# Patient Record
Sex: Female | Born: 1980
Health system: Southern US, Community
[De-identification: ages and names within clinical notes are randomized; demographics above are authoritative.]

## PROBLEM LIST (undated history)

## (undated) DIAGNOSIS — K529 Noninfective gastroenteritis and colitis, unspecified: Secondary | ICD-10-CM

## (undated) DIAGNOSIS — I10 Essential (primary) hypertension: Secondary | ICD-10-CM

## (undated) DIAGNOSIS — T7840XA Allergy, unspecified, initial encounter: Secondary | ICD-10-CM

## (undated) DIAGNOSIS — K219 Gastro-esophageal reflux disease without esophagitis: Secondary | ICD-10-CM

## (undated) DIAGNOSIS — R519 Headache, unspecified: Secondary | ICD-10-CM

## (undated) DIAGNOSIS — K649 Unspecified hemorrhoids: Secondary | ICD-10-CM

## (undated) DIAGNOSIS — F329 Major depressive disorder, single episode, unspecified: Secondary | ICD-10-CM

## (undated) DIAGNOSIS — F32A Depression, unspecified: Secondary | ICD-10-CM

## (undated) DIAGNOSIS — F419 Anxiety disorder, unspecified: Secondary | ICD-10-CM

## (undated) HISTORY — DX: Unspecified hemorrhoids: K64.9

## (undated) HISTORY — DX: Allergy, unspecified, initial encounter: T78.40XA

## (undated) HISTORY — DX: Anxiety disorder, unspecified: F41.9

## (undated) HISTORY — PX: COLONOSCOPY: SHX174

## (undated) HISTORY — DX: Noninfective gastroenteritis and colitis, unspecified: K52.9

## (undated) HISTORY — DX: Headache, unspecified: R51.9

## (undated) HISTORY — DX: Gastro-esophageal reflux disease without esophagitis: K21.9

## (undated) HISTORY — PX: OTHER SURGICAL HISTORY: SHX169

## (undated) HISTORY — PX: WISDOM TOOTH EXTRACTION: SHX21

---
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
## 2001-07-02 ENCOUNTER — Other Ambulatory Visit: Admission: RE | Admit: 2001-07-02 | Discharge: 2001-07-02 | Payer: Self-pay | Admitting: Obstetrics and Gynecology

## 2002-07-27 ENCOUNTER — Other Ambulatory Visit: Admission: RE | Admit: 2002-07-27 | Discharge: 2002-07-27 | Payer: Self-pay | Admitting: Obstetrics and Gynecology

## 2003-06-30 ENCOUNTER — Ambulatory Visit (HOSPITAL_COMMUNITY): Admission: RE | Admit: 2003-06-30 | Discharge: 2003-06-30 | Payer: Self-pay | Admitting: Family Medicine

## 2004-02-19 ENCOUNTER — Other Ambulatory Visit: Admission: RE | Admit: 2004-02-19 | Discharge: 2004-02-19 | Payer: Self-pay | Admitting: Obstetrics and Gynecology

## 2004-10-15 ENCOUNTER — Other Ambulatory Visit: Admission: RE | Admit: 2004-10-15 | Discharge: 2004-10-15 | Payer: Self-pay | Admitting: Obstetrics and Gynecology

## 2005-04-11 ENCOUNTER — Other Ambulatory Visit: Admission: RE | Admit: 2005-04-11 | Discharge: 2005-04-11 | Payer: Self-pay | Admitting: Obstetrics and Gynecology

## 2005-07-07 ENCOUNTER — Observation Stay (HOSPITAL_COMMUNITY): Admission: AD | Admit: 2005-07-07 | Discharge: 2005-07-08 | Payer: Self-pay | Admitting: Obstetrics and Gynecology

## 2005-10-07 ENCOUNTER — Encounter: Admission: RE | Admit: 2005-10-07 | Discharge: 2005-10-07 | Payer: Self-pay | Admitting: Obstetrics and Gynecology

## 2005-11-27 ENCOUNTER — Inpatient Hospital Stay (HOSPITAL_COMMUNITY): Admission: AD | Admit: 2005-11-27 | Discharge: 2005-11-27 | Payer: Self-pay | Admitting: Obstetrics and Gynecology

## 2005-11-28 ENCOUNTER — Inpatient Hospital Stay (HOSPITAL_COMMUNITY): Admission: AD | Admit: 2005-11-28 | Discharge: 2005-11-28 | Payer: Self-pay | Admitting: Obstetrics and Gynecology

## 2005-11-29 ENCOUNTER — Inpatient Hospital Stay (HOSPITAL_COMMUNITY): Admission: AD | Admit: 2005-11-29 | Discharge: 2005-11-29 | Payer: Self-pay | Admitting: Obstetrics and Gynecology

## 2005-12-01 ENCOUNTER — Inpatient Hospital Stay (HOSPITAL_COMMUNITY): Admission: AD | Admit: 2005-12-01 | Discharge: 2005-12-01 | Payer: Self-pay | Admitting: Obstetrics and Gynecology

## 2005-12-04 ENCOUNTER — Inpatient Hospital Stay (HOSPITAL_COMMUNITY): Admission: AD | Admit: 2005-12-04 | Discharge: 2005-12-06 | Payer: Self-pay | Admitting: Obstetrics & Gynecology

## 2008-04-24 ENCOUNTER — Ambulatory Visit (HOSPITAL_COMMUNITY): Admission: RE | Admit: 2008-04-24 | Discharge: 2008-04-24 | Payer: Self-pay | Admitting: Obstetrics and Gynecology

## 2008-12-31 ENCOUNTER — Inpatient Hospital Stay (HOSPITAL_COMMUNITY): Admission: AD | Admit: 2008-12-31 | Discharge: 2008-12-31 | Payer: Self-pay | Admitting: Obstetrics and Gynecology

## 2009-01-14 ENCOUNTER — Inpatient Hospital Stay (HOSPITAL_COMMUNITY): Admission: AD | Admit: 2009-01-14 | Discharge: 2009-01-14 | Payer: Self-pay | Admitting: Obstetrics and Gynecology

## 2009-01-25 ENCOUNTER — Inpatient Hospital Stay (HOSPITAL_COMMUNITY): Admission: RE | Admit: 2009-01-25 | Discharge: 2009-01-26 | Payer: Self-pay | Admitting: Obstetrics and Gynecology

## 2009-04-10 HISTORY — PX: ESSURE TUBAL LIGATION: SUR464

## 2009-06-26 ENCOUNTER — Ambulatory Visit (HOSPITAL_COMMUNITY): Admission: RE | Admit: 2009-06-26 | Discharge: 2009-06-26 | Payer: Self-pay | Admitting: Internal Medicine

## 2009-09-25 ENCOUNTER — Emergency Department (HOSPITAL_COMMUNITY): Admission: EM | Admit: 2009-09-25 | Discharge: 2009-09-25 | Payer: Self-pay | Admitting: Emergency Medicine

## 2010-05-25 LAB — POCT RAPID STREP A (OFFICE): Streptococcus, Group A Screen (Direct): NEGATIVE

## 2010-06-12 LAB — CBC
Hemoglobin: 9 g/dL — ABNORMAL LOW (ref 12.0–15.0)
MCV: 93.6 fL (ref 78.0–100.0)
Platelets: 158 10*3/uL (ref 150–400)
RDW: 14 % (ref 11.5–15.5)
WBC: 13.2 10*3/uL — ABNORMAL HIGH (ref 4.0–10.5)

## 2010-06-12 LAB — RPR: RPR Ser Ql: NONREACTIVE

## 2010-11-10 ENCOUNTER — Inpatient Hospital Stay (HOSPITAL_COMMUNITY)
Admission: RE | Admit: 2010-11-10 | Discharge: 2010-11-13 | DRG: 885 | Disposition: A | Discharge status: Home / Self Care | Payer: 59 | Source: Ambulatory Visit | Attending: Psychiatry | Admitting: Psychiatry

## 2010-11-10 DIAGNOSIS — T43205A Adverse effect of unspecified antidepressants, initial encounter: Secondary | ICD-10-CM

## 2010-11-10 DIAGNOSIS — R45851 Suicidal ideations: Secondary | ICD-10-CM

## 2010-11-10 DIAGNOSIS — F339 Major depressive disorder, recurrent, unspecified: Principal | ICD-10-CM

## 2010-11-10 DIAGNOSIS — R51 Headache: Secondary | ICD-10-CM

## 2010-11-10 DIAGNOSIS — R110 Nausea: Secondary | ICD-10-CM

## 2010-11-10 DIAGNOSIS — Z818 Family history of other mental and behavioral disorders: Secondary | ICD-10-CM

## 2010-11-10 DIAGNOSIS — Z79899 Other long term (current) drug therapy: Secondary | ICD-10-CM

## 2010-11-10 LAB — CBC
Hemoglobin: 14.1 g/dL (ref 12.0–15.0)
MCH: 30.2 pg (ref 26.0–34.0)
RBC: 4.67 MIL/uL (ref 3.87–5.11)
WBC: 11.1 10*3/uL — ABNORMAL HIGH (ref 4.0–10.5)

## 2010-11-10 LAB — COMPREHENSIVE METABOLIC PANEL
ALT: 11 U/L (ref 0–35)
Alkaline Phosphatase: 65 U/L (ref 39–117)
CO2: 26 mEq/L (ref 19–32)
Calcium: 9.6 mg/dL (ref 8.4–10.5)
Chloride: 103 mEq/L (ref 96–112)
GFR calc Af Amer: >60 mL/min (ref >60)
GFR calc non Af Amer: >60 mL/min (ref >60)
Glucose, Bld: 112 mg/dL — ABNORMAL HIGH (ref 70–99)
Potassium: 3.8 mEq/L (ref 3.5–5.1)
Sodium: 139 mEq/L (ref 135–145)
Total Bilirubin: 0.4 mg/dL (ref 0.3–1.2)

## 2010-11-11 DIAGNOSIS — F329 Major depressive disorder, single episode, unspecified: Secondary | ICD-10-CM

## 2010-11-11 LAB — URINALYSIS, ROUTINE W REFLEX MICROSCOPIC
Bilirubin Urine: NEGATIVE
Hgb urine dipstick: NEGATIVE
Specific Gravity, Urine: 1.01 (ref 1.005–1.030)
pH: 6 (ref 5.0–8.0)

## 2010-11-11 LAB — URINE MICROSCOPIC-ADD ON

## 2010-11-12 LAB — DRUGS OF ABUSE SCREEN W/O ALC, ROUTINE URINE
Amphetamine Screen, Ur: NEGATIVE
Barbiturate Quant, Ur: NEGATIVE
Cocaine Metabolites: NEGATIVE
Creatinine,U: 65 mg/dL
Propoxyphene: NEGATIVE

## 2010-11-12 LAB — URINALYSIS, ROUTINE W REFLEX MICROSCOPIC
Protein, ur: NEGATIVE mg/dL
Specific Gravity, Urine: 1.023 (ref 1.005–1.030)
pH: 5.5 (ref 5.0–8.0)

## 2010-11-12 LAB — URINE MICROSCOPIC-ADD ON

## 2010-11-14 NOTE — Discharge Summary (Signed)
  NAMEALIEYAH, Paige Hansen NO.:  [id number]  MEDICAL RECORD NO.:  [id number]  LOCATION:  0502                          FACILITY:  [facility]  PHYSICIAN:  Carloyn Jaeger, MD     DATE OF BIRTH:  [birth year]  DATE OF ADMISSION:  11/10/2010 DATE OF DISCHARGE:  11/13/2010                              DISCHARGE SUMMARY   REASON FOR ADMISSION:  This is a 30 year old female that presented with worsening depression for the past 6 months.  She was feeling irritable, isolating and having fleeting suicidal thoughts.  FINAL IMPRESSION:  Axis I:  Major depression disorder recurrent. Axis II:  Deferred. Axis III:  No acute illnesses. Axis IV:  Work-related stressors. Axis V:  GAF at discharge is 70.  SIGNIFICANT FINDINGS:  The patient was admitted to the adult milieu for safety and stabilization.  We did routine screening labs.  The patient was started on a total of 75 mg of Zoloft.  We had Vistaril available for anxiety.  She was reporting good sleep and appetite, mild depressive symptoms rating it a 3 on a scale of 1-10.  Denied any auditory hallucinations or delusional thinking.  She was experiencing some nausea and headache since she started on the Zoloft.  That was therefore discontinued, and she was started on Celexa.  She was feeling better. Sleep was much improved, and she adamantly denied any suicidal thoughts. We had contact with her husband to address any safety issues and for [place] to provide information.  The patient also was started on Cipro on her last day as the patient had white blood cells in her urine.  The patient was informed of her labs and was agreeable to starting the medications. On the day of discharge, sleep was good.  Appetite was good.  She had mild depressive symptoms, rating it a 1 on a scale of 1-10.  She adamantly denied any suicidal or homicidal thoughts or psychotic symptoms.  She was seen by the interdisciplinary treatment team.  DISCHARGE  MEDICATIONS: 1. Cipro 250 mg, 1 b.i.d. 2. Celexa 20 mg 1 daily. 3. Hydroxyzine 25 mg 1 twice a day as needed for anxiety.  FOLLOWUP:  Her follow-up appointment was with [facility], phone number [phone] on September 6th at 1:00 p.m.     Redgie Grayer, N.P.   ______________________________ Carloyn Jaeger, MD    JO/MEDQ  D:  11/13/2010  T:  11/13/2010  Job:  [id number]  Electronically Signed by Arnetha CourserP. on 11/14/2010 09:12:09 AM Electronically Signed by Carloyn Jaeger MD on 11/14/2010 01:39:31 PM

## 2010-11-24 NOTE — Assessment & Plan Note (Signed)
[name], Hansen              ACCOUNT NO.:  [id number]  MEDICAL RECORD NO.:  [id number]  LOCATION:  7939                          FACILITY:  [facility]  PHYSICIAN:  Carloyn Jaeger, MD     DATE OF BIRTH:  May 08, 1980  DATE OF ADMISSION:  11/10/2010 DATE OF DISCHARGE:                      PSYCHIATRIC ADMISSION ASSESSMENT   HISTORY OF PRESENT ILLNESS:  This is a 30 year old married white female. 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  She works as Designer, multimedia in the ICU.  She was accompanied by her supervisor [name], who remained during the assessment with verbal consent of the patient.  She reported worsening depression for the past 6 months.  She stated that about 6 months ago she started feeling snappy, and then she started to withdraw.  Reports having had fleeting suicidal ideation and then developing a plan to drive into oncoming traffic for the past 3 days.  Her supervisor reports that recently coworkers have reported concerns and complaints about the patient's changing demeanor.  She was called into the director's office 3 days ago, and the patient states that that is when the suicidal ideation became a plan.  She had thoughts to kill herself and then she "would not have to worry about what other people thought."  She states that after the birth of her daughter in November 2010, she did have some postpartum depression.  She was prescribed Zoloft, although she never filled the script.  Her work is depressing her.  She has been feeling upset recently when sitting with failed suicide attempted patients.  Her husband, a Airline pilot, recently had a work schedule changed to 24 hours on 48 hours off, and one of his fellow fire fighters recently suicided last week.  PAST PSYCHIATRIC HISTORY:  As already stated, she reports a slight postpartum depression after the birth of her daughter in November 2010. She was prescribed Zoloft but never took it.  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  She has a son 84, a daughter almost 2.  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  FAMILY HISTORY:  Her mother has depression and takes Zoloft.  ALCOHOL AND DRUG HISTORY:  Negative.  PRIMARY CARE PROVIDER:  Dr. Azalia Bilis.  MEDICAL PROBLEMS:  None known.  She states that in the past 6 months she has been gaining weight.  She wants to sleep all the time, occasionally she has had heart palpitations, and just in general does not feel pretty anymore.  There is a strong family history for hypothyroidism.  Her mother and all aunts are medicated for such, and she has not been evaluated.  MEDICATIONS:  None  DRUG ALLERGIES:  No known drug allergies  POSITIVE PHYSICAL FINDINGS:  Well-developed, well-nourished female who appears her stated age of 24.  Her vital signs were stable.  Her labs are being drawn.  MENTAL STATUS EXAM:  She is alert and oriented.  She was appropriately groomed and dressed in her own clothing.  Her speech was not  pressured. Her mood is depressed.  She was frequently and easily tearful.  Thought processes are clear, rational and goal oriented.  She knows that she needs to get help.  Judgment and insight are fair.  Concentration and memory are intact.  She still has suicidal ideation.  She did have a plan to drive on the wrong side of the road, and she realized that this would allow the insurance to pay out.  She is negative for homicidal ideation.  She is negative for auditory or visual hallucinations.  ADMISSION DIAGNOSES:  AXIS I:  Major depressive disorder, single episode, severe without psychotic features. AXIS II:  Deferred AXIS III:  Rule out hypothyroidism AXIS IV:  Work related stress issues AXIS V:  75  PLAN:  Plan is to admit for safety and stabilization.  We will  do routine screening to include a full thyroid functions.  We will start her on some Zoloft give her 25 mg dose now.  Will give her 50 mg tonight, so she will get a total of 75 mg today.  Will also allow Vistaril 25 mg p.o. q.4-6 h p.r.n.  Estimated length of stay is 3-5 days.     Paige Hansen, P.A.-C.   ______________________________ Carloyn Jaeger, MD    MD/MEDQ  D:  11/10/2010  T:  11/10/2010  Job:  [id number]  Electronically Signed by [name] ADAMS P.A.-C. on 11/24/2010 12:23:26 PM Electronically Signed by Carloyn Jaeger MD on 11/24/2010 09:21:00 PM

## 2011-06-04 ENCOUNTER — Other Ambulatory Visit (HOSPITAL_COMMUNITY): Payer: Self-pay | Admitting: Family Medicine

## 2011-06-04 DIAGNOSIS — R51 Headache: Secondary | ICD-10-CM

## 2011-06-07 ENCOUNTER — Other Ambulatory Visit (HOSPITAL_COMMUNITY): Payer: 59

## 2011-06-09 ENCOUNTER — Ambulatory Visit (HOSPITAL_COMMUNITY)
Admission: RE | Admit: 2011-06-09 | Discharge: 2011-06-09 | Disposition: A | Discharge status: Home / Self Care | Payer: 59 | Source: Ambulatory Visit | Attending: Family Medicine | Admitting: Family Medicine

## 2011-06-09 DIAGNOSIS — R51 Headache: Secondary | ICD-10-CM | POA: Insufficient documentation

## 2011-11-17 ENCOUNTER — Emergency Department (HOSPITAL_COMMUNITY): Payer: 59

## 2011-11-17 ENCOUNTER — Emergency Department (HOSPITAL_COMMUNITY)
Admission: EM | Admit: 2011-11-17 | Discharge: 2011-11-17 | Disposition: A | Discharge status: Home / Self Care | Payer: 59 | Attending: Emergency Medicine | Admitting: Emergency Medicine

## 2011-11-17 ENCOUNTER — Encounter (HOSPITAL_COMMUNITY): Payer: Self-pay | Admitting: *Deleted

## 2011-11-17 DIAGNOSIS — R109 Unspecified abdominal pain: Secondary | ICD-10-CM

## 2011-11-17 DIAGNOSIS — F329 Major depressive disorder, single episode, unspecified: Secondary | ICD-10-CM | POA: Insufficient documentation

## 2011-11-17 DIAGNOSIS — N760 Acute vaginitis: Secondary | ICD-10-CM | POA: Insufficient documentation

## 2011-11-17 DIAGNOSIS — B9689 Other specified bacterial agents as the cause of diseases classified elsewhere: Secondary | ICD-10-CM | POA: Insufficient documentation

## 2011-11-17 DIAGNOSIS — A499 Bacterial infection, unspecified: Secondary | ICD-10-CM | POA: Insufficient documentation

## 2011-11-17 DIAGNOSIS — F3289 Other specified depressive episodes: Secondary | ICD-10-CM | POA: Insufficient documentation

## 2011-11-17 DIAGNOSIS — Z79899 Other long term (current) drug therapy: Secondary | ICD-10-CM | POA: Insufficient documentation

## 2011-11-17 HISTORY — DX: Depression, unspecified: F32.A

## 2011-11-17 HISTORY — DX: Major depressive disorder, single episode, unspecified: F32.9

## 2011-11-17 LAB — URINE MICROSCOPIC-ADD ON

## 2011-11-17 LAB — URINALYSIS, ROUTINE W REFLEX MICROSCOPIC
Glucose, UA: NEGATIVE mg/dL
Specific Gravity, Urine: 1.02 (ref 1.005–1.030)
Urobilinogen, UA: 0.2 mg/dL (ref 0.0–1.0)
pH: 6.5 (ref 5.0–8.0)

## 2011-11-17 LAB — WET PREP, GENITAL

## 2011-11-17 LAB — POCT PREGNANCY, URINE: Preg Test, Ur: NEGATIVE

## 2011-11-17 MED ORDER — MORPHINE SULFATE 4 MG/ML IJ SOLN
4.0000 mg | Freq: Once | INTRAMUSCULAR | Status: AC
  Administered 2011-11-17: 4 mg via INTRAVENOUS
  Filled 2011-11-17: qty 1

## 2011-11-17 MED ORDER — HYDROCODONE-ACETAMINOPHEN 5-325 MG PO TABS: ORAL_TABLET | ORAL | Status: DC

## 2011-11-17 MED ORDER — SODIUM CHLORIDE 0.9 % IV SOLN
INTRAVENOUS | Status: DC
  Administered 2011-11-17: 125 mL via INTRAVENOUS

## 2011-11-17 MED ORDER — METRONIDAZOLE 500 MG PO TABS: 500.0000 mg | ORAL_TABLET | Freq: Three times a day (TID) | ORAL | Status: AC

## 2011-11-17 MED ORDER — PROMETHAZINE HCL 25 MG PO TABS: 25.0000 mg | ORAL_TABLET | Freq: Three times a day (TID) | ORAL | Status: DC | PRN

## 2011-11-17 MED ORDER — ONDANSETRON HCL 4 MG/2ML IJ SOLN
4.0000 mg | Freq: Once | INTRAMUSCULAR | Status: AC
  Administered 2011-11-17: 4 mg via INTRAVENOUS
  Filled 2011-11-17: qty 2

## 2011-11-17 MED ORDER — KETOROLAC TROMETHAMINE 30 MG/ML IJ SOLN
30.0000 mg | Freq: Once | INTRAMUSCULAR | Status: AC
  Administered 2011-11-17: 30 mg via INTRAVENOUS
  Filled 2011-11-17 (×2): qty 1

## 2011-11-17 NOTE — ED Notes (Signed)
Pt c/o LLQ abd pain that woke her out of sleep this am approx 3am. Has been sharp, intense, constant, worse at times. Reports "vomiting x2 from the pain." Denies nausea currently. Denies urinary or vaginal symptoms.

## 2011-11-17 NOTE — ED Provider Notes (Signed)
History     CSN: [id number]  Arrival date & time 11/17/11  0751   First MD Initiated Contact with Patient 11/17/11 0809      Chief Complaint  Patient presents with  . Abdominal Pain    (Consider location/radiation/quality/duration/timing/severity/associated sxs/prior treatment) Patient is a 31 y.o. female presenting with abdominal pain.  Abdominal Pain The primary symptoms of the illness include abdominal pain.    Patient reports at 3 AM this morning she was awakened with pain that she indicates in her left lateral abdomen that radiates to her left flank and sometimes into her lower pelvic area. She states the pain is sharp and constant. She states she had a bowel movement without change in her pain. She denies nausea now but she has had 2 episodes of vomiting. She denies any dysuria, frequency or hematuria. She states she was totally fine yesterday no change in activity. She states she's never had the pain before. She states her current pain as a 7 and it was a 10 earlier today. She states nothing makes it feel worse and nothing makes it feel better.  Patient relates her last normal period was August 30. She is G2 P2 Ab0.  PCP Dr. Charlynne Cousins GYN Dr. Matthew Saras  Past Medical History  Diagnosis Date  . Depression     No past surgical history on file.  BTL  No family history on file.  No renal stones.  History  Substance Use Topics  . Smoking status: Never Smoker   . Smokeless tobacco: Not on file  . 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  All other systems reviewed and are negative.    Allergies  Review of patient's allergies indicates no known allergies.  Home Medications   Current Outpatient Rx  Name Route Sig Dispense Refill  . ACETAMINOPHEN 500 MG PO TABS Oral Take 1,000 mg by  mouth every 6 (six) hours as needed. Pain    . ESCITALOPRAM OXALATE 20 MG PO TABS Oral Take 30 mg by mouth at bedtime. Take 1.5 tablets (30 mg total) at night    . OMEGA-3 FATTY ACIDS 1000 MG PO CAPS Oral Take 2 g by mouth daily.    Marland Kitchen FOLIC ACID 1 MG PO TABS Oral Take 2 mg by mouth daily.    Marland Kitchen GABAPENTIN 100 MG PO CAPS Oral Take 200 mg by mouth at bedtime. Take 2 capsules (200 mg) at bedtime    . IBUPROFEN 200 MG PO TABS Oral Take 800 mg by mouth every 6 (six) hours as needed. Pain    . TRAZODONE HCL 50 MG PO TABS Oral Take 25 mg by mouth at bedtime. Take 1/2 tablet (25 mg total) at bed time      BP 110/71  Pulse 86  Temp 98 F (36.7 C) (Oral)  SpO2 99%  LMP 11/07/2011  Vital signs normal    Physical Exam  Nursing note and vitals reviewed. Constitutional: She is oriented to person, place, and time. She appears well-developed and well-nourished.  Non-toxic appearance. She does not appear ill. No distress.  HENT:  Head: Normocephalic and atraumatic.  Right Ear: External ear normal.  Left Ear: External ear normal.  Nose: Nose normal. No mucosal edema  or rhinorrhea.  Mouth/Throat: Oropharynx is clear and moist and mucous membranes are normal. No dental abscesses or uvula swelling.  Eyes: Conjunctivae and EOM are normal. Pupils are equal, round, and reactive to light.  Neck: Normal range of motion and full passive range of motion without pain. Neck supple.  Cardiovascular: Normal rate, regular rhythm and normal heart sounds.  Exam reveals no gallop and no friction rub.   No murmur heard. Pulmonary/Chest: Effort normal and breath sounds normal. No respiratory distress. She has no wheezes. She has no rhonchi. She has no rales. She exhibits no tenderness and no crepitus.  Abdominal: Soft. Normal appearance and bowel sounds are normal. She exhibits no distension. There is no tenderness. There is no rebound and no guarding.    Genitourinary:       Pelvic exam done at 10 AM after normal CT  of the abdomen and pelvis. She has normal external genitalia. She has a small amount of white discharge in the vault. She is nontender to palpation of her uterus which is normal size. Right ovary is nontender and nonpalpable. She has some discomfort to palpation in the area of the left ovary is located. Left ovary does not appear to be enlarged.  Musculoskeletal: Normal range of motion. She exhibits no edema and no tenderness.       Moves all extremities well.   Neurological: She is alert and oriented to person, place, and time. She has normal strength. No cranial nerve deficit.  Skin: Skin is warm, dry and intact. No rash noted. No erythema. No pallor.  Psychiatric: She has a normal mood and affect. Her speech is normal and behavior is normal. Her mood appears not anxious.    ED Course  Procedures (including critical care time)   Medications  0.9 %  sodium chloride infusion (125 mL Intravenous New Bag/Given 11/17/11 0841)  ketorolac (TORADOL) 30 MG/ML injection 30 mg (not administered)  morphine 4 MG/ML injection 4 mg (4 mg Intravenous Given 11/17/11 0841)  ondansetron (ZOFRAN) injection 4 mg (4 mg Intravenous Given 11/17/11 0841)   Patient reports her pain is much improved after one dose of morphine.  Recheck pain starting to return. Wants to be able to drive home, given toradol.  Results for orders placed during the hospital encounter of 11/17/11  URINALYSIS, ROUTINE W REFLEX MICROSCOPIC      Component Value Range   Color, Urine YELLOW  YELLOW   APPearance CLEAR  CLEAR   Specific Gravity, Urine 1.020  1.005 - 1.030   pH 6.5  5.0 - 8.0   Glucose, UA NEGATIVE  NEGATIVE mg/dL   Hgb urine dipstick LARGE (*) NEGATIVE   Bilirubin Urine NEGATIVE  NEGATIVE   Ketones, ur NEGATIVE  NEGATIVE mg/dL   Protein, ur NEGATIVE  NEGATIVE mg/dL   Urobilinogen, UA 0.2  0.0 - 1.0 mg/dL   Nitrite NEGATIVE  NEGATIVE   Leukocytes, UA NEGATIVE  NEGATIVE  URINE MICROSCOPIC-ADD ON      Component Value Range     Squamous Epithelial / LPF RARE  RARE   WBC, UA 0-2  <3 WBC/hpf   RBC / HPF 7-10  <3 RBC/hpf   Bacteria, UA RARE  RARE   Urine-Other MUCOUS PRESENT    WET PREP, GENITAL      Component Value Range   Yeast Wet Prep HPF POC NONE SEEN  NONE SEEN   Trich, Wet Prep NONE SEEN  NONE SEEN   Clue Cells Wet Prep HPF POC RARE (*)  NONE SEEN   WBC, Wet Prep HPF POC FEW (*) NONE SEEN  POCT PREGNANCY, URINE      Component Value Range   Preg Test, Ur NEGATIVE  NEGATIVE  Urine pregnancy PCOT  negative    Laboratory interpretation all normal except BV   Ct Abdomen Pelvis Wo Contrast  11/17/2011  *RADIOLOGY REPORT*  Clinical Data: Abdominal pain.  Possible urinary tract stone disease.  CT ABDOMEN AND PELVIS WITHOUT CONTRAST  Technique:  Multidetector CT imaging of the abdomen and pelvis was performed following the standard protocol without intravenous contrast.  Comparison: None.  Findings: Lung bases are clear.  No pleural or pericardial fluid.  The liver has a normal appearance without focal lesions or biliary ductal dilatation.  No calcified gallstones.  The spleen is normal except for a few insignificant granulomas.  The pancreas is normal. The adrenal glands are normal.  The kidneys are normal.  No cyst, mass, stone or hydronephrosis.  No stone seen along the course of either ureter.  The aorta and IVC are normal.  No retroperitoneal mass or adenopathy.  No free intraperitoneal fluid or air.  No bladder pathology.  Uterus appears normal.  Bilateral fallopian tube devices in place.  No bony abnormality.  No primary bowel finding.  IMPRESSION: Negative examination.  No evidence of urinary tract stone disease. No other cause of abdominal pain identified.   Original Report Authenticated By: Jules Schick, M.D.     US Transvaginal Non-ob  US Pelvis Limited  US Art/ven Flow Abd Pelv Doppler  11/17/2011  *RADIOLOGY REPORT*  Clinical Data: Left lower quadrant pain.  Question cyst or torsion.  TRANSABDOMINAL  AND TRANSVAGINAL ULTRASOUND OF PELVIS DOPPLER ULTRASOUND OF OVARIES  Technique:  Both transabdominal and transvaginal ultrasound examinations of the pelvis were performed including evaluation of the uterus, ovaries, adnexal regions, and pelvic cul-de-sac. Color and duplex Doppler ultrasound was utilized to evaluate blood flow to the ovaries.  Comparison: CT 11/17/2011  Findings:  Uterus: 9.1 x 3.0 x 5.4 cm.  Normal echotexture.  Small calcifications noted near the endometrium.  Endometrium: Normal thickness, 5 mm.  Again, small myometrial or endometrial calcifications noted posteriorly.  Right Ovary: 3.7 x 2.4 x 3.0 cm. Normal size and echotexture.  No adnexal masses. Normal arterial and venous blood flow.  Left Ovary: 4.0 x 1.8 x 2.4 cm. Normal size and echotexture.  No adnexal masses.  Normal arterial and venous blood flow.  Other Findings:  No free fluid.  IMPRESSION:  No acute or significant abnormality.  No evidence of ovarian cyst or torsion.   Original Report Authenticated By: Raelyn Number, M.D.      1. Abdominal pain   2. BV (bacterial vaginosis)     New Prescriptions   HYDROCODONE-ACETAMINOPHEN (NORCO/VICODIN) 5-325 MG PER TABLET    Take 1 or 2 po Q 6hrs for pain   METRONIDAZOLE (FLAGYL) 500 MG TABLET    Take 1 tablet (500 mg total) by mouth 3 (three) times daily.   PROMETHAZINE (PHENERGAN) 25 MG TABLET    Take 1 tablet (25 mg total) by mouth every 8 (eight) hours as needed for nausea.    Plan discharge  Rolland Porter, MD, [place], MD 11/17/11 [place], MD 11/17/11 1446

## 2011-11-17 NOTE — ED Notes (Addendum)
POC preg test is negative. The mini lab is result for some reason is not crossing over. Dr. Tomi Bamberger Informed

## 2012-06-23 ENCOUNTER — Telehealth: Payer: Self-pay | Admitting: Gastroenterology

## 2012-06-23 NOTE — Telephone Encounter (Signed)
Patient is scheduled to see Tye Savoy RNP tomorrow at 11:00.  I have left a detailed message for Enid Derry with the instructions for the patient and the appt details.

## 2012-06-24 ENCOUNTER — Other Ambulatory Visit (INDEPENDENT_AMBULATORY_CARE_PROVIDER_SITE_OTHER): Payer: 59

## 2012-06-24 ENCOUNTER — Encounter: Payer: Self-pay | Admitting: Nurse Practitioner

## 2012-06-24 ENCOUNTER — Ambulatory Visit (INDEPENDENT_AMBULATORY_CARE_PROVIDER_SITE_OTHER): Payer: 59 | Admitting: Nurse Practitioner

## 2012-06-24 VITALS — BP 102/70 | HR 103 | Ht 62.0 in | Wt 191.0 lb

## 2012-06-24 DIAGNOSIS — K625 Hemorrhage of anus and rectum: Secondary | ICD-10-CM | POA: Insufficient documentation

## 2012-06-24 DIAGNOSIS — R1013 Epigastric pain: Secondary | ICD-10-CM

## 2012-06-24 DIAGNOSIS — K649 Unspecified hemorrhoids: Secondary | ICD-10-CM

## 2012-06-24 DIAGNOSIS — R197 Diarrhea, unspecified: Secondary | ICD-10-CM

## 2012-06-24 HISTORY — DX: Diarrhea, unspecified: R19.7

## 2012-06-24 HISTORY — DX: Epigastric pain: R10.13

## 2012-06-24 LAB — COMPREHENSIVE METABOLIC PANEL
ALT: 19 U/L (ref 0–35)
AST: 16 U/L (ref 0–37)
Alkaline Phosphatase: 65 U/L (ref 39–117)
BUN: 10 mg/dL (ref 6–23)
Creatinine, Ser: 0.6 mg/dL (ref 0.4–1.2)
Potassium: 4.4 mEq/L (ref 3.5–5.1)

## 2012-06-24 LAB — CBC WITH DIFFERENTIAL/PLATELET
Basophils Absolute: 0 10*3/uL (ref 0.0–0.1)
HCT: 43.1 % (ref 36.0–46.0)
Lymphs Abs: 1.9 10*3/uL (ref 0.7–4.0)
Monocytes Absolute: 0.5 10*3/uL (ref 0.1–1.0)
Monocytes Relative: 7.5 % (ref 3.0–12.0)
Neutrophils Relative %: 60 % (ref 43.0–77.0)
Platelets: 210 10*3/uL (ref 150.0–400.0)
RDW: 12.3 % (ref 11.5–14.6)
WBC: 6.6 10*3/uL (ref 4.5–10.5)

## 2012-06-24 LAB — C-REACTIVE PROTEIN: CRP: <0.5 mg/dL (ref 0.5–20.0)

## 2012-06-24 LAB — SEDIMENTATION RATE: Sed Rate: 11 mm/hr (ref 0–22)

## 2012-06-24 MED ORDER — HYDROCORTISONE ACETATE 25 MG RE SUPP: 25.0000 mg | Freq: Every day | RECTAL | Status: DC

## 2012-06-24 NOTE — Progress Notes (Signed)
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 Patient's normal bowel habits consist of a formed stool daily or every couple of days. For the last 3 weeks she has had frequent loose, malodorous bowel movements with blood. Patient was seen by her primary care physician a few days ago for rectal bleeding. CBC was normal. No hemorrhoids seen on anoscopic examination. She was referred to GI. Patient describes the blood as being bright red and present with almost every bowel movement since onset of diarrhea 3 weeks ago. No recent antibiotics.  No recent medication changes. No rectal pain. She does describe epigastric pain over the last three weeks. Pain mainly at night. Tums help to some degree. No nausea.   No family history of inflammatory bowel disease or colon cancer.  Past Medical History  Diagnosis Date  . Depression   . Anxiety     Family History  Problem Relation Age of Onset  . Breast cancer Maternal Grandmother   . Diabetes Mother    History  Substance Use Topics  . Smoking status: Never Smoker   . Smokeless tobacco: Never Used  . Alcohol Use: No   Current Outpatient Prescriptions  Medication Sig Dispense Refill  . ibuprofen (ADVIL,MOTRIN) 200 MG tablet Take 800 mg by mouth every 6 (six) hours as needed. Pain      . traZODone (DESYREL) 50 MG tablet Take 25 mg by mouth at bedtime. Take 1/2 tablet (25 mg total) at bed time       No current facility-administered medications for this visit.   No Known Allergies   Review of Systems: All systems reviewed and negative except where noted in HPI.    Physical Exam: BP 102/70  Pulse 103  Ht 5' 2"  (1.575 m)  Wt 191 lb (86.637 kg)  BMI 34.93 kg/m2  LMP 05/27/2012 Constitutional: Well-developed, white female in no acute distress. HEENT: Normocephalic and atraumatic. Conjunctivae are normal. No scleral icterus. Neck supple.   Cardiovascular: Normal rate, regular rhythm.  Pulmonary/chest: Effort normal and breath sounds normal. No wheezing, rales or rhonchi. Abdominal: Soft, nondistended, nontender. Bowel sounds active throughout. There are no masses palpable. No hepatomegaly. Rectal:  No external lesions. On anoscopy there were some small internal hemorrhoids. Extremities: no edema Lymphadenopathy: No cervical adenopathy noted. Neurological: Alert and oriented to person place and time. Skin: Skin is warm and dry. No rashes noted. Psychiatric: Normal mood and affect. Behavior is normal.   ASSESSMENT AND PLAN: 1. Diarrhea with blood. Three week history of loose, malodorous stools with blood occuring several times a day. 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 Will check stool studies. In addition, repeat CBC and obtain an ESR and CRP. The rectal bleeding sounds low volume and may be related to internal hemorrhoids but IBD or other etiologies not excluded. If stool studies are negative and diarrhea persists she will likely need colonoscopy for further evaluation. Will call patient with test results and further recommendations. Patient does not look toxic. Her abdominal exam is benign. She may take Imodium as needed until this can be sorted out.   2. Epigastric discomfort. Three week history of epigastric discomfort, mainly at night. TUMS help to some degree.   Onset of epigastric pain correlates timewise to #1 though not sure sure how it is related.  Samples of a PPI given to take at bedtime. If no improvement  this will further workup up with ultrasound and / or EGD.  3. Hemorrhoids. Small internal hemorrhoids, possibly cause of rectal bleeding. Trial of Anusol.

## 2012-06-24 NOTE — Patient Instructions (Addendum)
Your physician has requested that you go to the basement for the following lab work before leaving today: labs and stool studies We have sent the following medications to your pharmacy for you to pick up at your convenience: Anusol  We have given you samples of Prilosec. You can use Imodium as directed over the counter. CC:  Shirline Frees MD

## 2012-06-28 ENCOUNTER — Telehealth: Payer: Self-pay | Admitting: Nurse Practitioner

## 2012-06-28 LAB — STOOL CULTURE

## 2012-06-28 NOTE — Telephone Encounter (Signed)
Spoke with pt and she is aware, see result notes. Pt needs a colon scheduled. Pt states she will call back when she checks her calendar.

## 2012-06-29 ENCOUNTER — Encounter: Payer: Self-pay | Admitting: Internal Medicine

## 2012-06-29 NOTE — Telephone Encounter (Signed)
Pt is a [facility] pt.

## 2012-06-29 NOTE — Telephone Encounter (Signed)
Pt calling back to schedule colonoscopy. 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

## 2012-07-08 ENCOUNTER — Ambulatory Visit (AMBULATORY_SURGERY_CENTER): Payer: 59 | Admitting: *Deleted

## 2012-07-08 VITALS — Ht 62.0 in | Wt 191.0 lb

## 2012-07-08 DIAGNOSIS — K625 Hemorrhage of anus and rectum: Secondary | ICD-10-CM

## 2012-07-08 DIAGNOSIS — K649 Unspecified hemorrhoids: Secondary | ICD-10-CM

## 2012-07-08 DIAGNOSIS — R197 Diarrhea, unspecified: Secondary | ICD-10-CM

## 2012-07-08 DIAGNOSIS — R1013 Epigastric pain: Secondary | ICD-10-CM

## 2012-07-08 MED ORDER — PEG-KCL-NACL-NASULF-NA ASC-C 100 G PO SOLR: ORAL | Status: DC

## 2012-07-08 NOTE — Progress Notes (Signed)
No egg or soy allergy

## 2012-07-15 ENCOUNTER — Encounter: Payer: Self-pay | Admitting: Internal Medicine

## 2012-07-15 ENCOUNTER — Encounter: Payer: Self-pay | Admitting: *Deleted

## 2012-07-15 ENCOUNTER — Ambulatory Visit (AMBULATORY_SURGERY_CENTER): Payer: 59 | Admitting: Internal Medicine

## 2012-07-15 VITALS — BP 123/70 | HR 82 | Temp 98.7°F | Resp 16 | Ht 62.0 in | Wt 191.0 lb

## 2012-07-15 DIAGNOSIS — D126 Benign neoplasm of colon, unspecified: Secondary | ICD-10-CM

## 2012-07-15 DIAGNOSIS — R1013 Epigastric pain: Secondary | ICD-10-CM

## 2012-07-15 DIAGNOSIS — K625 Hemorrhage of anus and rectum: Secondary | ICD-10-CM

## 2012-07-15 DIAGNOSIS — K5289 Other specified noninfective gastroenteritis and colitis: Secondary | ICD-10-CM

## 2012-07-15 DIAGNOSIS — K5150 Left sided colitis without complications: Secondary | ICD-10-CM

## 2012-07-15 DIAGNOSIS — R197 Diarrhea, unspecified: Secondary | ICD-10-CM

## 2012-07-15 MED ORDER — HYDROCORTISONE ACETATE 25 MG RE SUPP: 25.0000 mg | Freq: Two times a day (BID) | RECTAL | Status: DC

## 2012-07-15 MED ORDER — MESALAMINE 1.2 G PO TBEC: 4.8000 g | DELAYED_RELEASE_TABLET | Freq: Every day | ORAL | Status: DC

## 2012-07-15 MED ORDER — SODIUM CHLORIDE 0.9 % IV SOLN: 500.0000 mL | INTRAVENOUS | Status: DC

## 2012-07-15 NOTE — Progress Notes (Signed)
Patient did not experience any of the following events: a burn prior to discharge; a fall within the facility; wrong site/side/patient/procedure/implant event; or a hospital transfer or hospital admission upon discharge from the facility. (G8907) Patient did not have preoperative order for IV antibiotic SSI prophylaxis. (G8918)  

## 2012-07-15 NOTE — Op Note (Signed)
[facility]  [address] [place], [address]   COLONOSCOPY PROCEDURE REPORT  PATIENT: Paige, Hansen  MR#: [id number] BIRTHDATE: [birth year] , 32  yrs. old GENDER: Female ENDOSCOPIST: Lafayette Dragon, MD REFERRED BY:  Shirline Frees, M.D. PROCEDURE DATE:  07/15/2012 PROCEDURE:   Colonoscopy with biopsy ASA CLASS:   Class II INDICATIONS:Unexplained diarrhea and hematochezia. MEDICATIONS: MAC sedation, administered by CRNA and propofol (Diprivan) 430m IV  DESCRIPTION OF PROCEDURE:   After the risks and benefits and of the procedure were explained, informed consent was obtained.  A digital rectal exam revealed no abnormalities of the rectum.    The LB [id number]-[id number] endoscope was introduced through the anus and advanced to the cecum, which was identified by both the appendix and ileocecal valve .  The quality of the prep was good, using MoviPrep .  The instrument was then slowly withdrawn as the colon was fully examined.     COLON FINDINGS: Abnormal mucosa was found in the rectum and sigmoid colon.consistent with acute colitis from 0-15 cm,  The mucosa was edematous, friable, bleeding and had granularity.  This was likely consistent with Inflammatory UC  Multiple random biopsies of the area were performed using cold forceps. Right colon including the cecum and transverse colon were normal _ biopsies taken Retroflexed views revealed no abnormalities.     The scope was then withdrawn from the patient and the procedure completed.  COMPLICATIONS: There were no complications. ENDOSCOPIC IMPRESSION: Abnormal mucosa was found in the rectum and sigmoid colon; multiple random biopsies of the area were performed using cold forceps , consistent with acute colitis from 0-15 cm, nomal appearing right and transverse colon  RECOMMENDATIONS: Await pathology results Lialda 1.2gm 4 po qgd Hydrocortisone supp 25 mg qhs OV 6 weeks    REPEAT EXAM: In 5 year(s)   for Colonoscopy.  cc:  _______________________________ eSigned:Lafayette Dragon MD 07/15/2012 3:18 PM

## 2012-07-15 NOTE — Progress Notes (Signed)
Lidocaine-40mg IV prior to Propofol InductionPropofol given over incremental dosages 

## 2012-07-15 NOTE — Patient Instructions (Addendum)
FOLLOW UP OFFICE VISIT    YOU HAD AN ENDOSCOPIC PROCEDURE TODAY AT [facility]: Refer to the procedure report that was given to you for any specific questions about what was found during the examination.  If the procedure report does not answer your questions, please call your gastroenterologist to clarify.  If you requested that your care partner not be given the details of your procedure findings, then the procedure report has been included in a sealed envelope for you to review at your convenience later.  YOU SHOULD EXPECT: Some feelings of bloating in the abdomen. Passage of more gas than usual.  Walking can help get rid of the air that was put into your GI tract during the procedure and reduce the bloating. If you had a lower endoscopy (such as a colonoscopy or flexible sigmoidoscopy) you may notice spotting of blood in your stool or on the toilet paper. If you underwent a bowel prep for your procedure, then you may not have a normal bowel movement for a few days.  DIET: Your first meal following the procedure should be a light meal and then it is ok to progress to your normal diet.  A half-sandwich or bowl of soup is an example of a good first meal.  Heavy or fried foods are harder to digest and may make you feel nauseous or bloated.  Likewise meals heavy in dairy and vegetables can cause extra gas to form and this can also increase the bloating.  Drink plenty of fluids but you should avoid alcoholic beverages for 24 hours.  ACTIVITY: Your care partner should take you home directly after the procedure.  You should plan to take it easy, moving slowly for the rest of the day.  You can resume normal activity the day after the procedure however you should NOT DRIVE or use heavy machinery for 24 hours (because of the sedation medicines used during the test).    SYMPTOMS TO REPORT IMMEDIATELY: A gastroenterologist can be reached at any hour.  During normal business hours, 8:30 AM to 5:00  PM Monday through Friday, call [phone].  After hours and on weekends, please call the GI answering service at [phone] who will take a message and have the physician on call contact you.   Following lower endoscopy (colonoscopy or flexible sigmoidoscopy):  Excessive amounts of blood in the stool  Significant tenderness or worsening of abdominal pains  Swelling of the abdomen that is new, acute  Fever of 100F or higher  FOLLOW UP: If any biopsies were taken you will be contacted by phone or by letter within the next 1-3 weeks.  Call your gastroenterologist if you have not heard about the biopsies in 3 weeks.  Our staff will call the home number listed on your records the next business day following your procedure to check on you and address any questions or concerns that you may have at that time regarding the information given to you following your procedure. This is a courtesy call and so if there is no answer at the home number and we have not heard from you through the emergency physician on call, we will assume that you have returned to your regular daily activities without incident.  SIGNATURES/CONFIDENTIALITY: You and/or your care partner have signed paperwork which will be entered into your electronic medical record.  These signatures attest to the fact that that the information above on your After Visit Summary has been reviewed and is understood.  Full responsibility of the confidentiality of this discharge information lies with you and/or your care-partner.

## 2012-07-15 NOTE — Progress Notes (Signed)
Called to room to assist during endoscopic procedure.  Patient ID and intended procedure confirmed with present staff. Received instructions for my participation in the procedure from the performing physician.  

## 2012-07-16 ENCOUNTER — Telehealth: Payer: Self-pay | Admitting: *Deleted

## 2012-07-16 NOTE — Telephone Encounter (Signed)
  Follow up Call-  Call back number 07/15/2012  Post procedure Call Back phone  # [phone]  Permission to leave phone message Yes     Patient questions:  Do you have a fever, pain , or abdominal swelling? no Pain Score  0 *  Have you tolerated food without any problems? yes  Have you been able to return to your normal activities? yes  Do you have any questions about your discharge instructions: Diet   no Medications  no Follow up visit  no  Do you have questions or concerns about your Care? no  Actions: * If pain score is 4 or above: No action needed, pain <4.

## 2012-07-21 ENCOUNTER — Encounter: Payer: Self-pay | Admitting: Internal Medicine

## 2012-08-23 ENCOUNTER — Telehealth: Payer: Self-pay | Admitting: Nurse Practitioner

## 2012-08-24 MED ORDER — PANTOPRAZOLE SODIUM 40 MG PO TBEC: 40.0000 mg | DELAYED_RELEASE_TABLET | Freq: Every day | ORAL | Status: DC

## 2012-08-24 NOTE — Telephone Encounter (Signed)
[facility] just got in some Protonix 40 mg.  I sent a prescription for the patient.  When Tye Savoy ACNP saw the patient in April 2014 we gave her Prilosec samples.  The patient said she can get protonix 40 mg free with her RX plan.  I sent a prescription for the patient.

## 2012-08-27 ENCOUNTER — Ambulatory Visit: Payer: 59 | Admitting: Internal Medicine

## 2012-09-03 ENCOUNTER — Other Ambulatory Visit: Payer: Self-pay | Admitting: *Deleted

## 2012-09-03 ENCOUNTER — Encounter: Payer: Self-pay | Admitting: *Deleted

## 2012-09-03 ENCOUNTER — Telehealth: Payer: Self-pay | Admitting: *Deleted

## 2012-09-03 MED ORDER — DICYCLOMINE HCL 10 MG PO CAPS
ORAL_CAPSULE | ORAL | Status: DC
Start: 2012-09-03 — End: 2013-10-18

## 2012-09-03 NOTE — Telephone Encounter (Signed)
Per Tye Savoy, NP needs rx for Bentyl 10 mg BID x 1 month refills 2. Rx sent.

## 2012-10-08 ENCOUNTER — Ambulatory Visit: Payer: 59 | Admitting: Internal Medicine

## 2012-11-24 ENCOUNTER — Telehealth: Payer: Self-pay | Admitting: Internal Medicine

## 2012-11-24 NOTE — Telephone Encounter (Signed)
Message copied by Oliva Bustard on Wed Nov 24, 2012  2:35 PM ------      Message from: Larina Bras      Created: Mon Oct 11, 2012  8:35 AM                   ----- Message -----         From: Lafayette Dragon, MD         Sent: 10/08/2012  10:51 PM           To: Larina Bras, CMA            Please charge no show.      ----- Message -----         From: Larina Bras, CMA         Sent: 10/08/2012   3:26 PM           To: Lafayette Dragon, MD            Patient no showed for appointment with Dr Olevia Perches on 10/08/12. Dr Olevia Perches, do you want to charge no show fee?       ------

## 2013-10-11 ENCOUNTER — Telehealth: Payer: Self-pay | Admitting: Internal Medicine

## 2013-10-11 NOTE — Telephone Encounter (Signed)
Spoke with patient and she states she has been having blood in stool x 4 weeks. Also reports upper abdominal pain. Offered OV on 10/14/13 with extender but patient cannot come. She states her next day off is 10/21/13 and she wants to schedule then. Scheduled on 10/21/13 at 1:30 PM with Alonza Bogus, PA.

## 2013-10-18 ENCOUNTER — Encounter: Payer: Self-pay | Admitting: Nurse Practitioner

## 2013-10-18 ENCOUNTER — Ambulatory Visit (INDEPENDENT_AMBULATORY_CARE_PROVIDER_SITE_OTHER): Payer: 59 | Admitting: Nurse Practitioner

## 2013-10-18 ENCOUNTER — Other Ambulatory Visit (INDEPENDENT_AMBULATORY_CARE_PROVIDER_SITE_OTHER): Payer: 59

## 2013-10-18 DIAGNOSIS — K5190 Ulcerative colitis, unspecified, without complications: Secondary | ICD-10-CM

## 2013-10-18 DIAGNOSIS — K51919 Ulcerative colitis, unspecified with unspecified complications: Secondary | ICD-10-CM

## 2013-10-18 DIAGNOSIS — K219 Gastro-esophageal reflux disease without esophagitis: Secondary | ICD-10-CM

## 2013-10-18 LAB — COMPREHENSIVE METABOLIC PANEL
ALBUMIN: 4.2 g/dL (ref 3.5–5.2)
ALK PHOS: 64 U/L (ref 39–117)
ALT: 15 U/L (ref 0–35)
AST: 15 U/L (ref 0–37)
BUN: 9 mg/dL (ref 6–23)
CO2: 21 mEq/L (ref 19–32)
Calcium: 9.1 mg/dL (ref 8.4–10.5)
Chloride: 102 mEq/L (ref 96–112)
Creatinine, Ser: 0.8 mg/dL (ref 0.4–1.2)
GFR: 90.41 mL/min (ref >60.00)
Glucose, Bld: 97 mg/dL (ref 70–99)
Potassium: 3.5 mEq/L (ref 3.5–5.1)
SODIUM: 137 meq/L (ref 135–145)
TOTAL PROTEIN: 6.9 g/dL (ref 6.0–8.3)
Total Bilirubin: 0.6 mg/dL (ref 0.2–1.2)

## 2013-10-18 LAB — CBC WITH DIFFERENTIAL/PLATELET
BASOS ABS: 0 10*3/uL (ref 0.0–0.1)
Basophils Relative: 0.3 % (ref 0.0–3.0)
EOS PCT: 1.9 % (ref 0.0–5.0)
Eosinophils Absolute: 0.2 10*3/uL (ref 0.0–0.7)
HCT: 39.5 % (ref 36.0–46.0)
Hemoglobin: 13.4 g/dL (ref 12.0–15.0)
Lymphocytes Relative: 23.8 % (ref 12.0–46.0)
Lymphs Abs: 2 10*3/uL (ref 0.7–4.0)
MCHC: 33.9 g/dL (ref 30.0–36.0)
MCV: 89.7 fl (ref 78.0–100.0)
Monocytes Absolute: 0.4 10*3/uL (ref 0.1–1.0)
Monocytes Relative: 4.8 % (ref 3.0–12.0)
NEUTROS PCT: 69.2 % (ref 43.0–77.0)
Neutro Abs: 5.8 10*3/uL (ref 1.4–7.7)
PLATELETS: 226 10*3/uL (ref 150.0–400.0)
RBC: 4.4 Mil/uL (ref 3.87–5.11)
RDW: 12 % (ref 11.5–15.5)
WBC: 8.4 10*3/uL (ref 4.0–10.5)

## 2013-10-18 MED ORDER — MESALAMINE 4 G RE ENEM: 4.0000 g | ENEMA | Freq: Every day | RECTAL | Status: DC

## 2013-10-18 MED ORDER — PREDNISONE 20 MG PO TABS: 20.0000 mg | ORAL_TABLET | Freq: Every day | ORAL | Status: DC

## 2013-10-18 MED ORDER — DICYCLOMINE HCL 10 MG PO CAPS: ORAL_CAPSULE | ORAL | Status: DC

## 2013-10-18 MED ORDER — PREDNISONE 20 MG PO TABS: ORAL_TABLET | ORAL | Status: DC

## 2013-10-18 NOTE — Progress Notes (Signed)
     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 She stopped Mesalamine several months ago and did well untreated until a few weeks ago when she developed loose stools with blood. She is averaging about 6 loose BMs a day, no nocturnal diarrhea. Bowel changes associated with diffuse abdominal discomfort which is worse with eating and defecation. Her weight is stable. No recent antibiotics.    Colonoscopy May 2014 revealed left sided colitis, biopsies c/w mild chronic active colitis.   Paige Hansen also complains of breakthrough heartburn. Burning worse at night. No regurgitation, just burning. Sleeps with HOB elevated. No late night eating  Current Medications, Allergies, Past Medical History, Past Surgical History, Family History and Social History were reviewed in [employer] record.  Physical Exam: General: Pleasant, well developed , white female in no acute distress Head: Normocephalic and atraumatic Eyes:  sclerae anicteric, conjunctiva pink  Ears: Normal auditory acuity Lungs: Clear throughout to auscultation Heart: Regular rate and rhythm Abdomen: Soft, non distended, non-tender. No masses, no hepatomegaly. Normal bowel sounds Musculoskeletal: Symmetrical with no gross deformities  Extremities: No edema  Neurological: Alert oriented x 4, grossly nonfocal Psychological:  Alert and cooperative. Normal mood and affect  Assessment and Recommendations:  75. 32 year old female with left sided UC diagnosed May 2014. She stopped Mesalamine several months ago and did well until a few weeks ago. Now with loose mucoid stools with blood and diffuse abdominal pain. Will check stool for c-diff. We discussed treatment options for what is likely UC flare. Will give tapering dose of Prednisone. She will also begin nightly Rowasa enemas. Hopefully we can get back into remission. Oral Lialda was  constipating for her. Will see if she can be maintained on mesalamine enemas. If not, will look at other options.     2. GERD, breakthrough symptoms on nightly PPI. I asked her to change PPI to 30 minutes prior to breakfast and add Pepcid AC at bedtime. Marland Kitchen

## 2013-10-18 NOTE — Patient Instructions (Addendum)
Your physician has requested that you go to the basement for  lab work before leaving today. We have sent  medications to your pharmacy for you to pick up at your convenience. Begin taking Protonix every morning 20-30 minutes prior to breakfast and pepcid ac at bedtime. 11/22/13 345 pm follow up appointment with Dr Olevia Perches We have given you information on GERD. CC:  Shirline Frees MD

## 2013-10-19 DIAGNOSIS — K5190 Ulcerative colitis, unspecified, without complications: Secondary | ICD-10-CM | POA: Insufficient documentation

## 2013-10-19 DIAGNOSIS — K219 Gastro-esophageal reflux disease without esophagitis: Secondary | ICD-10-CM | POA: Insufficient documentation

## 2013-10-19 NOTE — Progress Notes (Signed)
Reviewed and agree. Will likely need a maintenance dose of Mesalamine or Colazal.

## 2013-10-20 ENCOUNTER — Other Ambulatory Visit: Payer: 59

## 2013-10-20 DIAGNOSIS — K51919 Ulcerative colitis, unspecified with unspecified complications: Secondary | ICD-10-CM

## 2013-10-21 ENCOUNTER — Ambulatory Visit: Payer: 59 | Admitting: Gastroenterology

## 2013-10-21 LAB — CLOSTRIDIUM DIFFICILE BY PCR: Toxigenic C. Difficile by PCR: NOT DETECTED

## 2013-10-31 ENCOUNTER — Other Ambulatory Visit: Payer: Self-pay | Admitting: *Deleted

## 2013-10-31 MED ORDER — PREDNISONE 20 MG PO TABS: ORAL_TABLET | ORAL | Status: DC

## 2013-11-22 ENCOUNTER — Encounter: Payer: Self-pay | Admitting: Internal Medicine

## 2013-11-22 ENCOUNTER — Ambulatory Visit (INDEPENDENT_AMBULATORY_CARE_PROVIDER_SITE_OTHER): Payer: 59 | Admitting: Internal Medicine

## 2013-11-22 VITALS — BP 96/68 | HR 96 | Ht 62.25 in | Wt 190.0 lb

## 2013-11-22 DIAGNOSIS — K5150 Left sided colitis without complications: Secondary | ICD-10-CM

## 2013-11-22 MED ORDER — TRAMADOL HCL 50 MG PO TABS: ORAL_TABLET | ORAL | Status: DC

## 2013-11-22 MED ORDER — MESALAMINE 1.2 G PO TBEC: 2.4000 g | DELAYED_RELEASE_TABLET | Freq: Two times a day (BID) | ORAL | Status: DC

## 2013-11-22 MED ORDER — BUDESONIDE 9 MG PO TB24: 1.0000 | ORAL_TABLET | Freq: Every day | ORAL | Status: DC

## 2013-11-22 NOTE — Patient Instructions (Addendum)
We have sent the following medications to your pharmacy for you to pick up at your convenience: Uceris 9 mg daily x 8 weeks Lialda 2 tablets twice daily Tramadol Continue steroid taper. Please follow up with Dr Olevia Perches in 6-8 weeks.  CC:Dr Shirline Frees

## 2013-11-22 NOTE — Progress Notes (Signed)
Deniss [facility] Jul 12, 1980 [id number]  Note: This dictation was prepared with Dragon digital system. Any transcriptional errors that result from this procedure are unintentional.   History of Present Illness:  This is a 33 year old white female seen by Tye Savoy, NP 4 weeks ago for flareup of ulcerative colitis. Patient complained of left upper quadrant abdominal pain, rectal bleeding and diarrhea. She was started on a prednisone taper at 40 mg for 5 days tapered by 10 mg every 5 days and Rowasa enemas every night. The rectal bleeding has stopped but she continues to have abdominal pain and diarrhea. Her entire abdomen is very sore and her appetite has been decreased. She has poor tolerance to systemic steroids. Her last colonoscopy in May 2014 showed left-sided ulcerative colitis from 0-15 cm. She was on mesalamine 4.8 g daily following the initial diagnosis in May 2014.    Past Medical History  Diagnosis Date  . Depression   . Anxiety   . Colitis   . Hemorrhoids     History reviewed. No pertinent past surgical history.  No Known Allergies  Family history and social history have been reviewed.  Review of Systems: Positive for nausea. Negative for rectal bleeding negative for fever  The remainder of the 10 point ROS is negative except as outlined in the H&P  Physical Exam: General Appearance Well developed, in no distress, overweight Eyes  Non icteric  HEENT  Non traumatic, normocephalic  Mouth No lesion, tongue papillated, no cheilosis Neck Supple without adenopathy, thyroid not enlarged, no carotid bruits, no JVD Lungs Clear to auscultation bilaterally COR Normal S1, normal S2, regular rhythm, no murmur, quiet precordium Abdomen diffusely tender especially in the left upper quadrant and across upper abdomen. Normoactive bowel sounds. No rebound  Rectal exam not repeated Neurological Alert and oriented x 3 Psychological Normal mood and affect  Assessment and  Plan:  Problem #29 33 year old, white female with exacerbation of ulcerative colitis. We will restart Lialda 1.2 g 2 tablets twice a day and Uceris 9 mg daily. I will give her tramadol 50 mg to take when necessary for abdominal pain. She is to follow a low residue diet. Patient should have an office visit in 6-8 weeks. If symptoms continue, we may consider biologicals or Imuran.    Delfin Edis 11/22/2013

## 2014-01-11 ENCOUNTER — Ambulatory Visit (INDEPENDENT_AMBULATORY_CARE_PROVIDER_SITE_OTHER): Payer: 59 | Admitting: Internal Medicine

## 2014-01-11 ENCOUNTER — Encounter: Payer: Self-pay | Admitting: Internal Medicine

## 2014-01-11 VITALS — BP 100/70 | HR 76 | Ht 62.0 in | Wt 191.4 lb

## 2014-01-11 DIAGNOSIS — K5150 Left sided colitis without complications: Secondary | ICD-10-CM

## 2014-01-11 NOTE — Patient Instructions (Signed)
Dr Shirline Frees

## 2014-01-11 NOTE — Progress Notes (Signed)
Paige Hansen [facility] [birth year] [id number]  Note: This dictation was prepared with Dragon digital system. Any transcriptional errors that result from this procedure are unintentional.   History of Present Illness:  This is a 33 year old white female with left-sided ulcerative colitis. Her last colonoscopy in May 2014 showed active colitis from 0-15 cm. She had a flareup in August 2015 and was started on Uceris 9 mg daily and mesalamine 4.8 g daily. I saw her on 11/22/2013 and she was getting better. She is currently asymptomatic. She denies abdominal pain, diarrhea or rectal bleeding. Her weight has remained stable. She has currently no complaints.     Past Medical History  Diagnosis Date  . Depression   . Anxiety   . Colitis   . Hemorrhoids     History reviewed. No pertinent past surgical history.  No Known Allergies  Family history and social history have been reviewed.  Review of Systems:   The remainder of the 10 point ROS is negative except as outlined in the H&P  Physical Exam: General Appearance Well developed, in no distress Psychological Normal mood and affect  Assessment and Plan:   Problem #53 33 year old white female with left sided ulcerative colitis which has responded to a combination of mesalamine and steroids. She will discontinue Uceris as of now and continue on mesalamine 4.8 g daily. She is somewhat constipated and I advised for her to take a stool softener or milk of magnesia as needed. If she remains asymptomatic until the first of the year, she will be able to reduce the mesalamine to 2.4 g daily. She will update me on her condition in a couple of months.    Delfin Edis 01/11/2014

## 2014-12-18 ENCOUNTER — Other Ambulatory Visit: Payer: Self-pay | Admitting: Nurse Practitioner

## 2014-12-18 DIAGNOSIS — N6310 Unspecified lump in the right breast, unspecified quadrant: Secondary | ICD-10-CM

## 2014-12-25 ENCOUNTER — Other Ambulatory Visit: Payer: 59

## 2014-12-28 ENCOUNTER — Ambulatory Visit
Admission: RE | Admit: 2014-12-28 | Discharge: 2014-12-28 | Disposition: A | Discharge status: Home / Self Care | Payer: 59 | Source: Ambulatory Visit | Attending: Nurse Practitioner | Admitting: Nurse Practitioner

## 2014-12-28 DIAGNOSIS — N6310 Unspecified lump in the right breast, unspecified quadrant: Secondary | ICD-10-CM

## 2015-04-11 ENCOUNTER — Other Ambulatory Visit: Payer: Self-pay | Admitting: Obstetrics and Gynecology

## 2015-04-11 DIAGNOSIS — Z01419 Encounter for gynecological examination (general) (routine) without abnormal findings: Secondary | ICD-10-CM | POA: Diagnosis not present

## 2015-04-11 DIAGNOSIS — N6310 Unspecified lump in the right breast, unspecified quadrant: Principal | ICD-10-CM

## 2015-04-11 DIAGNOSIS — N644 Mastodynia: Secondary | ICD-10-CM

## 2015-04-11 DIAGNOSIS — Z6833 Body mass index (BMI) 33.0-33.9, adult: Secondary | ICD-10-CM | POA: Diagnosis not present

## 2015-04-11 DIAGNOSIS — N6315 Unspecified lump in the right breast, overlapping quadrants: Secondary | ICD-10-CM

## 2015-04-17 ENCOUNTER — Ambulatory Visit
Admission: RE | Admit: 2015-04-17 | Discharge: 2015-04-17 | Disposition: A | Discharge status: Home / Self Care | Payer: 59 | Source: Ambulatory Visit | Attending: Obstetrics and Gynecology | Admitting: Obstetrics and Gynecology

## 2015-04-17 DIAGNOSIS — N6315 Unspecified lump in the right breast, overlapping quadrants: Secondary | ICD-10-CM

## 2015-04-17 DIAGNOSIS — N644 Mastodynia: Secondary | ICD-10-CM

## 2015-04-17 DIAGNOSIS — R928 Other abnormal and inconclusive findings on diagnostic imaging of breast: Secondary | ICD-10-CM | POA: Diagnosis not present

## 2015-04-17 DIAGNOSIS — N6310 Unspecified lump in the right breast, unspecified quadrant: Principal | ICD-10-CM

## 2015-04-17 DIAGNOSIS — N6489 Other specified disorders of breast: Secondary | ICD-10-CM | POA: Diagnosis not present

## 2015-04-24 MED FILL — QSYMIA 3.75 MG-23 MG CAP: 3.75-23 | 14 days supply | Qty: 14 | Fill #0

## 2015-05-07 MED FILL — QSYMIA 7.5 MG-46 MG CAPSULE: 7.5-46 | 30 days supply | Qty: 30 | Fill #0 | Status: TO

## 2015-06-08 MED FILL — QSYMIA 7.5 MG-46 MG CAPSULE: 7.5-46 | 30 days supply | Qty: 30 | Fill #0

## 2015-07-10 DIAGNOSIS — Z713 Dietary counseling and surveillance: Secondary | ICD-10-CM | POA: Diagnosis not present

## 2015-07-10 DIAGNOSIS — Z6830 Body mass index (BMI) 30.0-30.9, adult: Secondary | ICD-10-CM | POA: Diagnosis not present

## 2015-07-10 DIAGNOSIS — E6609 Other obesity due to excess calories: Secondary | ICD-10-CM | POA: Diagnosis not present

## 2015-07-10 MED FILL — QSYMIA 7.5 MG-46 MG CAPSULE: 7.5-46 | 30 days supply | Qty: 30 | Fill #0 | Status: TO

## 2015-08-07 MED FILL — QSYMIA 7.5 MG-46 MG CAPSULE: 7.5-46 | 30 days supply | Qty: 30 | Fill #0

## 2015-09-07 MED FILL — QSYMIA 7.5 MG-46 MG CAPSULE: 7.5-46 | 30 days supply | Qty: 30 | Fill #1

## 2015-10-09 DIAGNOSIS — Z713 Dietary counseling and surveillance: Secondary | ICD-10-CM | POA: Diagnosis not present

## 2015-10-09 DIAGNOSIS — E6609 Other obesity due to excess calories: Secondary | ICD-10-CM | POA: Diagnosis not present

## 2015-10-09 DIAGNOSIS — Z6829 Body mass index (BMI) 29.0-29.9, adult: Secondary | ICD-10-CM | POA: Diagnosis not present

## 2015-10-09 MED FILL — QSYMIA 7.5 MG-46 MG CAPSULE: 7.5-46 | 30 days supply | Qty: 30 | Fill #0

## 2015-11-06 DIAGNOSIS — N6019 Diffuse cystic mastopathy of unspecified breast: Secondary | ICD-10-CM | POA: Diagnosis not present

## 2015-11-23 MED FILL — QSYMIA 7.5 MG-46 MG CAPSULE: 7.5-46 | 14 days supply | Qty: 14 | Fill #1

## 2015-12-06 MED FILL — QSYMIA 7.5 MG-46 MG CAPSULE: 7.5-46 | 30 days supply | Qty: 30 | Fill #2

## 2016-01-08 MED FILL — QSYMIA 7.5 MG-46 MG CAPSULE: 7.5-46 | 30 days supply | Qty: 30 | Fill #0

## 2016-02-08 MED FILL — QSYMIA 7.5 MG-46 MG CAPSULE: 7.5-46 | 30 days supply | Qty: 30 | Fill #1

## 2016-03-17 MED FILL — QSYMIA 7.5 MG-46 MG CAPSULE: 7.5-46 | 30 days supply | Qty: 30 | Fill #2

## 2016-05-01 DIAGNOSIS — Z13220 Encounter for screening for lipoid disorders: Secondary | ICD-10-CM | POA: Diagnosis not present

## 2016-05-01 DIAGNOSIS — Z01419 Encounter for gynecological examination (general) (routine) without abnormal findings: Secondary | ICD-10-CM | POA: Diagnosis not present

## 2016-05-01 DIAGNOSIS — Z6827 Body mass index (BMI) 27.0-27.9, adult: Secondary | ICD-10-CM | POA: Diagnosis not present

## 2016-05-01 DIAGNOSIS — Z13228 Encounter for screening for other metabolic disorders: Secondary | ICD-10-CM | POA: Diagnosis not present

## 2016-05-05 MED FILL — QSYMIA 7.5 MG-46 MG CAPSULE: 7.5-46 | 30 days supply | Qty: 30 | Fill #0

## 2016-06-16 MED FILL — QSYMIA 7.5 MG-46 MG CAPSULE: 7.5-46 | 30 days supply | Qty: 30 | Fill #1

## 2016-08-27 MED FILL — QSYMIA 7.5 MG-46 MG CAPSULE: 7.5-46 | 30 days supply | Qty: 30 | Fill #2

## 2016-12-22 MED FILL — SULFAMETHOXAZOLE-TMP DS TAB: 800-160 | 10 days supply | Qty: 20 | Fill #0

## 2017-03-12 DIAGNOSIS — L919 Hypertrophic disorder of the skin, unspecified: Secondary | ICD-10-CM | POA: Diagnosis not present

## 2017-03-12 DIAGNOSIS — D225 Melanocytic nevi of trunk: Secondary | ICD-10-CM | POA: Diagnosis not present

## 2017-03-12 DIAGNOSIS — L821 Other seborrheic keratosis: Secondary | ICD-10-CM | POA: Diagnosis not present

## 2017-03-12 DIAGNOSIS — D485 Neoplasm of uncertain behavior of skin: Secondary | ICD-10-CM | POA: Diagnosis not present

## 2017-03-12 DIAGNOSIS — Z23 Encounter for immunization: Secondary | ICD-10-CM | POA: Diagnosis not present

## 2017-06-30 ENCOUNTER — Encounter (INDEPENDENT_AMBULATORY_CARE_PROVIDER_SITE_OTHER): Payer: Self-pay

## 2017-06-30 ENCOUNTER — Ambulatory Visit: Payer: 59 | Admitting: Nurse Practitioner

## 2017-06-30 ENCOUNTER — Other Ambulatory Visit (INDEPENDENT_AMBULATORY_CARE_PROVIDER_SITE_OTHER): Payer: 59

## 2017-06-30 ENCOUNTER — Encounter: Payer: Self-pay | Admitting: Nurse Practitioner

## 2017-06-30 VITALS — BP 110/76 | HR 80 | Ht 62.25 in | Wt 204.8 lb

## 2017-06-30 DIAGNOSIS — R1011 Right upper quadrant pain: Secondary | ICD-10-CM | POA: Diagnosis not present

## 2017-06-30 DIAGNOSIS — R110 Nausea: Secondary | ICD-10-CM

## 2017-06-30 DIAGNOSIS — K5180 Other ulcerative colitis without complications: Secondary | ICD-10-CM | POA: Diagnosis not present

## 2017-06-30 LAB — CBC WITH DIFFERENTIAL/PLATELET
BASOS ABS: 0 10*3/uL (ref 0.0–0.1)
Basophils Relative: 0.4 % (ref 0.0–3.0)
EOS ABS: 0.1 10*3/uL (ref 0.0–0.7)
Eosinophils Relative: 1.3 % (ref 0.0–5.0)
HEMATOCRIT: 41.3 % (ref 36.0–46.0)
HEMOGLOBIN: 14.4 g/dL (ref 12.0–15.0)
LYMPHS PCT: 28.5 % (ref 12.0–46.0)
Lymphs Abs: 2.5 10*3/uL (ref 0.7–4.0)
MCHC: 34.8 g/dL (ref 30.0–36.0)
MCV: 89.6 fl (ref 78.0–100.0)
MONO ABS: 0.6 10*3/uL (ref 0.1–1.0)
Monocytes Relative: 6.6 % (ref 3.0–12.0)
Neutro Abs: 5.5 10*3/uL (ref 1.4–7.7)
Neutrophils Relative %: 63.2 % (ref 43.0–77.0)
Platelets: 221 10*3/uL (ref 150.0–400.0)
RBC: 4.61 Mil/uL (ref 3.87–5.11)
RDW: 12.6 % (ref 11.5–15.5)
WBC: 8.6 10*3/uL (ref 4.0–10.5)

## 2017-06-30 LAB — COMPREHENSIVE METABOLIC PANEL
ALT: 17 U/L (ref 0–35)
AST: 13 U/L (ref 0–37)
Albumin: 4.2 g/dL (ref 3.5–5.2)
Alkaline Phosphatase: 62 U/L (ref 39–117)
BUN: 9 mg/dL (ref 6–23)
CO2: 28 mEq/L (ref 19–32)
Calcium: 9.3 mg/dL (ref 8.4–10.5)
Chloride: 107 mEq/L (ref 96–112)
Creatinine, Ser: 0.64 mg/dL (ref 0.40–1.20)
GFR: 109.69 mL/min (ref >60.00)
Glucose, Bld: 102 mg/dL — ABNORMAL HIGH (ref 70–99)
Potassium: 4.6 mEq/L (ref 3.5–5.1)
Sodium: 138 mEq/L (ref 135–145)
Total Bilirubin: 0.5 mg/dL (ref 0.2–1.2)
Total Protein: 6.6 g/dL (ref 6.0–8.3)

## 2017-06-30 MED ORDER — PANTOPRAZOLE SODIUM 40 MG PO TBEC: 40.0000 mg | DELAYED_RELEASE_TABLET | ORAL | 3 refills | Status: DC

## 2017-06-30 MED FILL — PANTOPRAZOLE SOD DR 40 MG T: 40 | 30 days supply | Qty: 30 | Fill #0

## 2017-06-30 NOTE — Progress Notes (Signed)
Chief Complaint: RUQ pain, nausea  Referring Provider:    self   ASSESSMENT AND PLAN;   40.  37 year old female with 37-monthhistory of nonradiating right upper quadrant pain exacerbated by eating but also bending.  She has some associated nausea.  Rule out peptic ulcer disease, gallbladder disease -CMET, CBC -obtain RUQ u/s -She will be scheduled for EGD to be done sometime in May.  If ultrasound suggests symptoms may be gallbladder related then we can certainly cancel the upper endoscopy . The risks and benefits of EGD were discussed and the patient agrees to proceed.  -omeprazole 40 mg every morning 30 minutes before breakfast. -declines anti-emetic.  -stop Motrin for now -Start daily PPI   2. GERD. Heartburn refractory to TUMs.  She is to take Protonix, it worked well before -Restart Protonix 40 mg 30 minutes before breakfast daily  3.  History of left-sided colitis found in 2014.  Biopsies compatible with active chronic colitis.  She has been off mesalamine for nearly 4 years with normal bowel movements.  -At some point she should probably have a follow-up colonoscopy or at least flexible sigmoidoscopy with biopsies to assess status of IBD.  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 She was followed by Dr. BOlevia Perchesfor UC, hasn't been seen here in several years. Off medicine for UC for 5 years and bowel movements are completely normal. Stools are soft, no blood.   She is here today for intermittent RUQ pain which seems to get worse after eating but bending exacerbates it as well. Pain is sharp, doesn't radiate. She has intermittent nausea. No weight loss, she is gaining weight . She takes large amounts of motrin for the pain almost every other day but it doesn't help as much as is used to. No fevers.    JMarabethhas a hx of GERD.  She is having frequent heartburn.  Initially heartburn was at night but now during the day as well. Tums used to help but now double dosing doesn't even help . No regurgitation, just heartburn.    Past Medical History:  Diagnosis Date  . Anxiety   . Colitis   . Depression   . Hemorrhoids     Past Surgical History:  Procedure Laterality Date  . none     Family History  Problem Relation Age of Onset  . Breast cancer Maternal Grandmother   . Diabetes Mother   . Colon cancer Neg Hx   . Esophageal cancer Neg Hx   . Rectal cancer Neg Hx   . Stomach cancer Neg Hx    Social History   Tobacco Use  . Smoking status: Never Smoker  . Smokeless tobacco: Never Used  Substance Use Topics  . Alcohol use: No  . Drug use: No   Current Outpatient Medications  Medication Sig Dispense Refill  . calcium carbonate (TUMS - DOSED IN MG ELEMENTAL CALCIUM) 500 MG chewable tablet Chew 3 tablets by mouth as needed for indigestion or heartburn.    .Marland Kitchenibuprofen (ADVIL,MOTRIN) 200 MG tablet Take 800 mg by mouth every 6 (six) hours as needed. Pain     No current facility-administered medications for this visit.    No Known Allergies   Review of Systems: All systems reviewed and negative except where noted in HPI.    Physical Exam:  Wt Readings from Last 3 Encounters:  06/30/17 204 lb 12.8 oz (92.9 kg)  01/11/14 191 lb 6.4 oz (86.8 kg)  11/22/13 190 lb (86.2 kg)    BP 110/76   Pulse 80   Ht 5' 2.25" (1.581 m)   Wt 204 lb 12.8 oz (92.9 kg)   BMI 37.16 kg/m  Constitutional:  Pleasant white female in no acute distress. Psychiatric: Normal mood and affect. Behavior is normal. EENT: Pupils normal.  Conjunctivae are normal. No scleral icterus. Neck supple.  Cardiovascular: Normal rate, regular rhythm. No edema Pulmonary/chest: Effort normal and breath sounds normal. No wheezing, rales or rhonchi. Abdominal: Soft, nondistended. Mild RUQ tenderness. Bowel sounds active throughout. There are no masses palpable. No  hepatomegaly. Neurological: Alert and oriented to person place and time. Skin: Skin is warm and dry. No rashes noted.  Paige Savoy, NP  06/30/2017, 9:26 AM

## 2017-06-30 NOTE — Patient Instructions (Signed)
If you are age 38 or older, your body mass index should be between 23-30. Your Body mass index is 37.16 kg/m. If this is out of the aforementioned range listed, please consider follow up with your Primary Care Provider.  If you are age 87 or younger, your body mass index should be between 19-25. Your Body mass index is 37.16 kg/m. If this is out of the aformentioned range listed, please consider follow up with your Primary Care Provider.   You have been scheduled for an endoscopy. Please follow written instructions given to you at your visit today. If you use inhalers (even only as needed), please bring them with you on the day of your procedure. Your physician has requested that you go to www.startemmi.com and enter the access code given to you at your visit today. This web site gives a general overview about your procedure. However, you should still follow specific instructions given to you by our office regarding your preparation for the procedure.  Your provider has requested that you go to the basement level for lab work before leaving today. Press "B" on the elevator. The lab is located at the first door on the left as you exit the elevator.  We have sent the following medications to your pharmacy for you to pick up at your convenience: Protonix  STOP Motrin.  You have been scheduled for an abdominal ultrasound at [facility] (1st floor of hospital) on 07/06/17 at 8:30 am. Please arrive 15 minutes prior to your appointment for registration. Make certain not to have anything to eat or drink after midnight the day prior to your appointment. Should you need to reschedule your appointment, please contact radiology at [phone]. This test typically takes about 30 minutes to perform.  Thank you for choosing me and [facility].   Tye Savoy, NP

## 2017-07-06 ENCOUNTER — Ambulatory Visit (HOSPITAL_COMMUNITY)
Admission: RE | Admit: 2017-07-06 | Discharge: 2017-07-06 | Disposition: A | Discharge status: Home / Self Care | Payer: 59 | Source: Ambulatory Visit | Attending: Nurse Practitioner | Admitting: Nurse Practitioner

## 2017-07-06 DIAGNOSIS — R110 Nausea: Secondary | ICD-10-CM | POA: Diagnosis not present

## 2017-07-06 DIAGNOSIS — R1011 Right upper quadrant pain: Secondary | ICD-10-CM | POA: Insufficient documentation

## 2017-07-15 ENCOUNTER — Encounter: Payer: Self-pay | Admitting: Gastroenterology

## 2017-07-28 ENCOUNTER — Telehealth: Payer: Self-pay

## 2017-07-28 NOTE — Telephone Encounter (Signed)
-----   Message from Willia Craze, NP sent at 07/27/2017 12:07 AM EDT ----- Eustaquio Maize, would you mind just checking on [facility]. She works at the hospital and I thought I might see her there and let her know the u/s was okay. Her EGD is in late June and I just want to make sure pain not getting worse or that she hasn't developed any new sx. Thanks

## 2017-07-28 NOTE — Telephone Encounter (Signed)
I left her a message on her voicemail.  She does not need to call back unless she is having increased pain or new symptoms.

## 2017-07-29 ENCOUNTER — Encounter: Payer: 59 | Admitting: Gastroenterology

## 2017-08-28 ENCOUNTER — Other Ambulatory Visit: Payer: Self-pay

## 2017-08-28 ENCOUNTER — Ambulatory Visit (AMBULATORY_SURGERY_CENTER): Payer: 59 | Admitting: Gastroenterology

## 2017-08-28 ENCOUNTER — Encounter: Payer: Self-pay | Admitting: Gastroenterology

## 2017-08-28 VITALS — BP 104/71 | HR 80 | Temp 99.3°F | Resp 16 | Ht 62.25 in | Wt 204.0 lb

## 2017-08-28 DIAGNOSIS — R1011 Right upper quadrant pain: Secondary | ICD-10-CM | POA: Diagnosis present

## 2017-08-28 DIAGNOSIS — R110 Nausea: Secondary | ICD-10-CM

## 2017-08-28 DIAGNOSIS — K3189 Other diseases of stomach and duodenum: Secondary | ICD-10-CM | POA: Diagnosis not present

## 2017-08-28 MED ORDER — SODIUM CHLORIDE 0.9 % IV SOLN: 500.0000 mL | Freq: Once | INTRAVENOUS | Status: DC

## 2017-08-28 NOTE — Op Note (Signed)
[facility] Patient Name: Paige Hansen Procedure Date: 08/28/2017 10:24 AM MRN: [id number] Endoscopist: Remo Lipps P. Havery Moros , MD Age: 37 Referring MD:  Date of Birth: 10/26/1980 Gender: Female Account #: [id number] Procedure:                Upper GI endoscopy Indications:              postprandial abdominal pain in the right upper                            quadrant, Nausea. on protonix 40m once daily                            without benefit. RUQ UKoreanegative for gallstones Medicines:                Monitored Anesthesia Care Procedure:                Pre-Anesthesia Assessment:                           - Prior to the procedure, a History and Physical                            was performed, and patient medications and                            allergies were reviewed. The patient's tolerance of                            previous anesthesia was also reviewed. The risks                            and benefits of the procedure and the sedation                            options and risks were discussed with the patient.                            All questions were answered, and informed consent                            was obtained. Prior Anticoagulants: The patient has                            taken no previous anticoagulant or antiplatelet                            agents. ASA Grade Assessment: II - A patient with                            mild systemic disease. After reviewing the risks                            and benefits, the patient was deemed in  satisfactory condition to undergo the procedure.                           After obtaining informed consent, the endoscope was                            passed under direct vision. Throughout the                            procedure, the patient's blood pressure, pulse, and                            oxygen saturations were monitored continuously. The   Endoscope was introduced through the mouth, and                            advanced to the second part of duodenum. The upper                            GI endoscopy was accomplished without difficulty.                            The patient tolerated the procedure well. Scope In: Scope Out: Findings:                 Esophagogastric landmarks were identified: the                            Z-line was found at 37 cm, the gastroesophageal                            junction was found at 37 cm and the upper extent of                            the gastric folds was found at 39 cm from the                            incisors.                           A 2 cm hiatal hernia was present.                           The exam of the esophagus was otherwise normal.                           The entire examined stomach was normal. Biopsies                            were taken from the antrum, body, and incisura with                            a cold forceps for Helicobacter pylori testing.  The duodenal bulb and second portion of the                            duodenum were normal. Complications:            No immediate complications. Estimated blood loss:                            Minimal. Estimated Blood Loss:     Estimated blood loss was minimal. Impression:               - Esophagogastric landmarks identified.                           - 2 cm hiatal hernia.                           - Normal esophagus.                           - Normal stomach. Biopsied to rule out H pylori.                           - Normal duodenal bulb and second portion of the                            duodenum.                           No overt cause for abdominal pain on this exam,                            will await biopsies. Recommendation:           - Patient has a contact number available for                            emergencies. The signs and symptoms of potential                             delayed complications were discussed with the                            patient. Return to normal activities tomorrow.                            Written discharge instructions were provided to the                            patient.                           - Resume previous diet.                           - Continue present medications.                           -  Await pathology results.                           - If H pylori negative, recommend HIDA scan to                            further evaluate gallbladder Remo Lipps P. [name], MD 08/28/2017 10:40:56 AM This report has been signed electronically.

## 2017-08-28 NOTE — Patient Instructions (Signed)
YOU HAD AN ENDOSCOPIC PROCEDURE TODAY AT [facility]:   Refer to the procedure report that was given to you for any specific questions about what was found during the examination.  If the procedure report does not answer your questions, please call your gastroenterologist to clarify.  If you requested that your care partner not be given the details of your procedure findings, then the procedure report has been included in a sealed envelope for you to review at your convenience later.  YOU SHOULD EXPECT: Some feelings of bloating in the abdomen. Passage of more gas than usual.  Walking can help get rid of the air that was put into your GI tract during the procedure and reduce the bloating. If you had a lower endoscopy (such as a colonoscopy or flexible sigmoidoscopy) you may notice spotting of blood in your stool or on the toilet paper. If you underwent a bowel prep for your procedure, you may not have a normal bowel movement for a few days.  Please Note:  You might notice some irritation and congestion in your nose or some drainage.  This is from the oxygen used during your procedure.  There is no need for concern and it should clear up in a day or so.  SYMPTOMS TO REPORT IMMEDIATELY:   Following upper endoscopy (EGD)  Vomiting of blood or coffee ground material  New chest pain or pain under the shoulder blades  Painful or persistently difficult swallowing  New shortness of breath  Fever of 100F or higher  [place], tarry-looking stools  Please see handouts given to you on hiatal Hernia.  For urgent or emergent issues, a gastroenterologist can be reached at any hour by calling [phone].   DIET:  We do recommend a small meal at first, but then you may proceed to your regular diet.  Drink plenty of fluids but you should avoid alcoholic beverages for 24 hours.  ACTIVITY:  You should plan to take it easy for the rest of today and you should NOT DRIVE or use heavy machinery  until tomorrow (because of the sedation medicines used during the test).    FOLLOW UP: Our staff will call the number listed on your records the next business day following your procedure to check on you and address any questions or concerns that you may have regarding the information given to you following your procedure. If we do not reach you, we will leave a message.  However, if you are feeling well and you are not experiencing any problems, there is no need to return our call.  We will assume that you have returned to your regular daily activities without incident.  If any biopsies were taken you will be contacted by phone or by letter within the next 1-3 weeks.  Please call us at [phone] if you have not heard about the biopsies in 3 weeks.    SIGNATURES/CONFIDENTIALITY: You and/or your care partner have signed paperwork which will be entered into your electronic medical record.  These signatures attest to the fact that that the information above on your After Visit Summary has been reviewed and is understood.  Full responsibility of the confidentiality of this discharge information lies with you and/or your care-partner.  Thank you for letting us take care of your healthcare needs today.

## 2017-08-28 NOTE — Progress Notes (Signed)
Called to room to assist during endoscopic procedure.  Patient ID and intended procedure confirmed with present staff. Received instructions for my participation in the procedure from the performing physician.  

## 2017-08-28 NOTE — Progress Notes (Signed)
To PACU, VSS. Report to [email or web address] 

## 2017-08-31 ENCOUNTER — Telehealth: Payer: Self-pay

## 2017-08-31 NOTE — Telephone Encounter (Signed)
Attempted to reach pt. With follow-up call following endoscopic procedure 08/28/2017.  LM on pt. Voice mail.   Will try to reach pt. Again later today.

## 2017-08-31 NOTE — Telephone Encounter (Signed)
  Follow up Call-  Call back number 08/28/2017  Post procedure Call Back phone  # [phone]  Permission to leave phone message Yes  Some recent data might be hidden     Patient questions:  Do you have a fever, pain , or abdominal swelling? No. Pain Score  0 *  Have you tolerated food without any problems? Yes.    Have you been able to return to your normal activities? Yes.    Do you have any questions about your discharge instructions: Diet   No. Medications  No. Follow up visit  No.  Do you have questions or concerns about your Care? No.  Actions: * If pain score is 4 or above: No action needed, pain <4.

## 2017-09-04 ENCOUNTER — Other Ambulatory Visit: Payer: Self-pay

## 2017-09-04 DIAGNOSIS — R1011 Right upper quadrant pain: Secondary | ICD-10-CM

## 2017-09-28 ENCOUNTER — Encounter (HOSPITAL_COMMUNITY)
Admission: RE | Admit: 2017-09-28 | Discharge: 2017-09-28 | Disposition: A | Discharge status: Home / Self Care | Payer: 59 | Source: Ambulatory Visit | Attending: Gastroenterology | Admitting: Gastroenterology

## 2017-09-28 DIAGNOSIS — R1011 Right upper quadrant pain: Secondary | ICD-10-CM | POA: Insufficient documentation

## 2017-09-28 DIAGNOSIS — R112 Nausea with vomiting, unspecified: Secondary | ICD-10-CM | POA: Diagnosis not present

## 2017-09-28 MED ORDER — TECHNETIUM TC 99M MEBROFENIN IV KIT
5.1000 | PACK | Freq: Once | INTRAVENOUS | Status: AC | PRN
  Administered 2017-09-28: 5.1 via INTRAVENOUS

## 2017-09-29 DIAGNOSIS — Z01419 Encounter for gynecological examination (general) (routine) without abnormal findings: Secondary | ICD-10-CM | POA: Diagnosis not present

## 2017-09-29 DIAGNOSIS — Z6836 Body mass index (BMI) 36.0-36.9, adult: Secondary | ICD-10-CM | POA: Diagnosis not present

## 2017-10-15 DIAGNOSIS — R1031 Right lower quadrant pain: Secondary | ICD-10-CM | POA: Diagnosis not present

## 2017-10-15 DIAGNOSIS — N941 Unspecified dyspareunia: Secondary | ICD-10-CM | POA: Diagnosis not present

## 2017-11-16 MED FILL — PANTOPRAZOLE SOD DR 40 MG T: 40 | 30 days supply | Qty: 30 | Fill #1

## 2017-11-24 ENCOUNTER — Ambulatory Visit: Payer: 59 | Admitting: Gastroenterology

## 2017-12-10 ENCOUNTER — Encounter: Payer: Self-pay | Admitting: Gastroenterology

## 2017-12-20 DIAGNOSIS — R109 Unspecified abdominal pain: Secondary | ICD-10-CM | POA: Diagnosis not present

## 2017-12-20 DIAGNOSIS — Z79899 Other long term (current) drug therapy: Secondary | ICD-10-CM | POA: Diagnosis not present

## 2017-12-20 DIAGNOSIS — R1084 Generalized abdominal pain: Secondary | ICD-10-CM | POA: Diagnosis not present

## 2017-12-20 DIAGNOSIS — Z791 Long term (current) use of non-steroidal anti-inflammatories (NSAID): Secondary | ICD-10-CM | POA: Diagnosis not present

## 2017-12-20 DIAGNOSIS — R110 Nausea: Secondary | ICD-10-CM | POA: Diagnosis not present

## 2017-12-20 DIAGNOSIS — R5381 Other malaise: Secondary | ICD-10-CM | POA: Diagnosis not present

## 2017-12-20 DIAGNOSIS — N83201 Unspecified ovarian cyst, right side: Secondary | ICD-10-CM | POA: Diagnosis not present

## 2017-12-20 DIAGNOSIS — R52 Pain, unspecified: Secondary | ICD-10-CM | POA: Diagnosis not present

## 2018-03-15 DIAGNOSIS — L813 Cafe au lait spots: Secondary | ICD-10-CM | POA: Diagnosis not present

## 2018-03-15 DIAGNOSIS — L570 Actinic keratosis: Secondary | ICD-10-CM | POA: Diagnosis not present

## 2018-03-15 DIAGNOSIS — D225 Melanocytic nevi of trunk: Secondary | ICD-10-CM | POA: Diagnosis not present

## 2018-03-15 DIAGNOSIS — Z23 Encounter for immunization: Secondary | ICD-10-CM | POA: Diagnosis not present

## 2018-03-15 MED FILL — FLUOROURACIL 5 % CREA: 5 | 21 days supply | Qty: 40 | Fill #0

## 2018-03-30 ENCOUNTER — Encounter: Payer: 59 | Attending: Family Medicine | Admitting: Dietician

## 2018-03-30 ENCOUNTER — Encounter: Payer: Self-pay | Admitting: Dietician

## 2018-03-30 VITALS — Ht 63.0 in | Wt 205.0 lb

## 2018-03-30 DIAGNOSIS — Z6836 Body mass index (BMI) 36.0-36.9, adult: Principal | ICD-10-CM

## 2018-03-30 DIAGNOSIS — E6609 Other obesity due to excess calories: Secondary | ICD-10-CM

## 2018-03-30 NOTE — Progress Notes (Signed)
Notes from [facility] employee "self referral" nutrition session: Start time: 9:00 am   End time  9:45  Met with employee to discuss his/her nutritional concerns and diet history. The employee's questions/concerns were also addressed.  We discussed the following topics:  Healthy Eating  Exercise  Weight Concerns  I also provided the following handouts as reinforcement of the educational session:  Planning a Balanced Meal  Food Guide Plate  Quick and Healthy Meals  Additional Comments: Patient dines out frequently; most dinner meals are eaten "out" making it more difficult to control portions and fat intake.  Goals Agreed Upon: 1.Balance meals with 2-4 oz lean protein, 2-3 servings of carbohydrate and non-starchy vegetables.  [place] to occasionally work in [employer], quantity serving of sweets as part of your carbohydrate.  3. Prepare more meals at home verses eating out as frequently.

## 2018-03-30 NOTE — Patient Instructions (Signed)
Balance meals with 2-4 oz protein, 2-3 servings of carbohydrate and non-starchy vegetables. Spread 9-10 servings of carbohydrate over 3 meals and 1-2 snacks. 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 Continue to prepare more meals at home verses eating out as frequently.

## 2018-05-06 ENCOUNTER — Encounter: Payer: Self-pay | Admitting: Dietician

## 2018-05-06 ENCOUNTER — Encounter: Payer: 59 | Attending: Family Medicine | Admitting: Dietician

## 2018-05-06 VITALS — Wt 207.7 lb

## 2018-05-06 DIAGNOSIS — E6609 Other obesity due to excess calories: Secondary | ICD-10-CM

## 2018-05-06 DIAGNOSIS — Z6836 Body mass index (BMI) 36.0-36.9, adult: Principal | ICD-10-CM

## 2018-05-06 NOTE — Progress Notes (Signed)
Notes from [facility] employee "self referral" nutrition session: Start time: 11:45   End time: 1220  Met with employee to discuss his/her nutritional concerns and diet history. The employee's questions/concerns were also addressed.  We discussed the following topics:  Healthy Eating  Exercise  Weight Concerns  I also provided the following handouts as reinforcement of the educational session:  Planning a Balanced Meal (2nd copy)  Additional Comments: Patient has followed through to prepare more meals at home and is eating dinner "out" 2 days weekly. She has decreased her sweetened tea but continues to drink 20-24 oz daily. She doesn't like water. She has a structured meal pattern on work days, 3 days per week but is less structured on her days off; often skips lunch. She is walking for exercise, 3 days weekly for 45 minutes. She has gained 2.7 lbs since her initial visit 1 month ago.   Goals Agreed Upon: Increase exercise to 5 days per week for 45 minutes. Add 1/2 mile to the 2 miles already walking. Establish a structured meal plan on your days off- 3 meals spaced 4-5 hours apart. If there are more than 6 hours between lunch an dinner, include a snack.  Increase fruits and vegetables. Try to include a fruit or vegetable or both at lunch and dinner.  Think of the food guide plate: 1/2 plate non-starchy vegetables and fruits, 1/4 plate starch and 1/4 plate protein. Decrease sweet tea to 8-10 oz. Increase water.  Follow-up: 06/07/18

## 2018-05-06 NOTE — Patient Instructions (Addendum)
Increase exercise to 5 days per week for 45 minutes. Add 1/2 mile to the 2 miles already walking. Establish a structured meal plan on your days off- 3 meals spaced 4-5 hours apart. If there are more than 6 hours between lunch an dinner, include a snack.  Increase fruits and vegetables. Try to include a fruit or vegetable or both at lunch and dinner.  Think of the food guide plate: 1/2 plate non-starchy vegetables and fruits, 1/4 plate starch and 1/4 plate protein. Decrease sweet tea to 8-10 oz. Increase water.

## 2018-05-26 MED FILL — VENTOLIN HFA 90 MCG INHALER: 108 (90 BAS | 16 days supply | Qty: 18 | Fill #0

## 2018-05-26 MED FILL — OSELTAMIVIR PHOSPHATE 75 MG: 75 | 5 days supply | Qty: 10 | Fill #0

## 2018-06-07 ENCOUNTER — Ambulatory Visit: Payer: 59 | Admitting: Dietician

## 2018-06-14 ENCOUNTER — Encounter: Payer: 59 | Attending: Family Medicine | Admitting: Dietician

## 2018-06-14 ENCOUNTER — Other Ambulatory Visit: Payer: Self-pay

## 2018-06-14 ENCOUNTER — Encounter: Payer: Self-pay | Admitting: Dietician

## 2018-06-14 VITALS — Wt 205.8 lb

## 2018-06-14 DIAGNOSIS — E6609 Other obesity due to excess calories: Secondary | ICD-10-CM

## 2018-06-14 DIAGNOSIS — Z6836 Body mass index (BMI) 36.0-36.9, adult: Principal | ICD-10-CM

## 2018-06-14 NOTE — Patient Instructions (Signed)
Continue with positive changes already in progress. Increase exercise to 5 days per week for 45 minutes.

## 2018-06-14 NOTE — Progress Notes (Signed)
Notes from [facility] employee "self referral" nutrition session: Start time:10:30   End time: 10:50  Met with employee to discuss his/her nutritional concerns and diet history. The employee's questions/concerns were also addressed.  We discussed the following topics:  Healthy Eating  Weight Concerns  Exercise   Additional Comments: Patient's weight today was 205.8 which indicates a 2.3 lb loss since previous visit 5.5 weeks ago. Patient has switched from sweet tea to unsweetened tea and has increased her water intake. She is eating on more of a structured meal/snack pattern on days she is at home. She is using a salad plate for meals in place of a larger dinner plate and she states this is helping her to control portions. She is including a fruit at most meals and includes 2 vegetable servings for most dinners. She states she has not increased her exercise from current 3 days/week for 45 minutes but plans to work on that.   Goals Agreed Upon: 1.Continue with positive diet changes already in progress.  2.Increase exercise to 5 days for 45 minutes  No follow-up scheduled at this time.

## 2018-10-04 DIAGNOSIS — Z6838 Body mass index (BMI) 38.0-38.9, adult: Secondary | ICD-10-CM | POA: Diagnosis not present

## 2018-10-04 DIAGNOSIS — Z1231 Encounter for screening mammogram for malignant neoplasm of breast: Secondary | ICD-10-CM | POA: Diagnosis not present

## 2018-10-04 DIAGNOSIS — K449 Diaphragmatic hernia without obstruction or gangrene: Secondary | ICD-10-CM | POA: Insufficient documentation

## 2018-10-04 DIAGNOSIS — Z01419 Encounter for gynecological examination (general) (routine) without abnormal findings: Secondary | ICD-10-CM | POA: Diagnosis not present

## 2019-01-11 MED FILL — QSYMIA 7.5 MG-46 MG CAPSULE: 7.5-46 | 30 days supply | Qty: 30 | Fill #0

## 2019-02-16 MED FILL — QSYMIA 7.5 MG-46 MG CAPSULE: 7.5-46 | 30 days supply | Qty: 30 | Fill #1

## 2019-03-21 DIAGNOSIS — D2272 Melanocytic nevi of left lower limb, including hip: Secondary | ICD-10-CM | POA: Diagnosis not present

## 2019-03-21 DIAGNOSIS — D224 Melanocytic nevi of scalp and neck: Secondary | ICD-10-CM | POA: Diagnosis not present

## 2019-03-21 DIAGNOSIS — D485 Neoplasm of uncertain behavior of skin: Secondary | ICD-10-CM | POA: Diagnosis not present

## 2019-03-21 DIAGNOSIS — B351 Tinea unguium: Secondary | ICD-10-CM | POA: Diagnosis not present

## 2019-03-21 DIAGNOSIS — L570 Actinic keratosis: Secondary | ICD-10-CM | POA: Diagnosis not present

## 2019-03-21 DIAGNOSIS — D225 Melanocytic nevi of trunk: Secondary | ICD-10-CM | POA: Diagnosis not present

## 2019-03-21 DIAGNOSIS — L578 Other skin changes due to chronic exposure to nonionizing radiation: Secondary | ICD-10-CM | POA: Diagnosis not present

## 2019-03-21 DIAGNOSIS — L813 Cafe au lait spots: Secondary | ICD-10-CM | POA: Diagnosis not present

## 2019-03-21 DIAGNOSIS — Z23 Encounter for immunization: Secondary | ICD-10-CM | POA: Diagnosis not present

## 2019-03-21 DIAGNOSIS — L603 Nail dystrophy: Secondary | ICD-10-CM | POA: Diagnosis not present

## 2019-03-21 DIAGNOSIS — D2262 Melanocytic nevi of left upper limb, including shoulder: Secondary | ICD-10-CM | POA: Diagnosis not present

## 2019-03-21 MED FILL — QSYMIA 7.5 MG-46 MG CAPSULE: 7.5-46 | 30 days supply | Qty: 30 | Fill #2

## 2019-03-28 DIAGNOSIS — M25511 Pain in right shoulder: Secondary | ICD-10-CM | POA: Diagnosis not present

## 2019-03-28 DIAGNOSIS — M7501 Adhesive capsulitis of right shoulder: Secondary | ICD-10-CM | POA: Diagnosis not present

## 2019-04-06 DIAGNOSIS — M7501 Adhesive capsulitis of right shoulder: Secondary | ICD-10-CM | POA: Diagnosis not present

## 2019-04-07 DIAGNOSIS — Z6838 Body mass index (BMI) 38.0-38.9, adult: Secondary | ICD-10-CM | POA: Diagnosis not present

## 2019-04-07 DIAGNOSIS — Z713 Dietary counseling and surveillance: Secondary | ICD-10-CM | POA: Diagnosis not present

## 2019-04-11 DIAGNOSIS — M7501 Adhesive capsulitis of right shoulder: Secondary | ICD-10-CM | POA: Diagnosis not present

## 2019-04-12 MED FILL — QSYMIA 11.25 MG-69 MG CAP: 11.25-69 | 30 days supply | Qty: 30 | Fill #0

## 2019-04-14 DIAGNOSIS — M7501 Adhesive capsulitis of right shoulder: Secondary | ICD-10-CM | POA: Diagnosis not present

## 2019-04-18 DIAGNOSIS — M7501 Adhesive capsulitis of right shoulder: Secondary | ICD-10-CM | POA: Diagnosis not present

## 2019-05-18 MED FILL — QSYMIA 11.25 MG-69 MG CAP: 11.25-69 | 30 days supply | Qty: 30 | Fill #1

## 2019-05-19 DIAGNOSIS — M7501 Adhesive capsulitis of right shoulder: Secondary | ICD-10-CM | POA: Insufficient documentation

## 2019-05-20 DIAGNOSIS — M7501 Adhesive capsulitis of right shoulder: Secondary | ICD-10-CM | POA: Diagnosis not present

## 2019-11-03 DIAGNOSIS — M792 Neuralgia and neuritis, unspecified: Secondary | ICD-10-CM | POA: Diagnosis not present

## 2019-11-03 DIAGNOSIS — M722 Plantar fascial fibromatosis: Secondary | ICD-10-CM | POA: Diagnosis not present

## 2019-11-03 DIAGNOSIS — M79672 Pain in left foot: Secondary | ICD-10-CM | POA: Diagnosis not present

## 2019-11-03 DIAGNOSIS — R609 Edema, unspecified: Secondary | ICD-10-CM | POA: Diagnosis not present

## 2019-11-03 DIAGNOSIS — S96012A Strain of muscle and tendon of long flexor muscle of toe at ankle and foot level, left foot, initial encounter: Secondary | ICD-10-CM | POA: Diagnosis not present

## 2019-11-03 DIAGNOSIS — M7732 Calcaneal spur, left foot: Secondary | ICD-10-CM | POA: Diagnosis not present

## 2019-11-23 DIAGNOSIS — M7732 Calcaneal spur, left foot: Secondary | ICD-10-CM | POA: Diagnosis not present

## 2019-11-23 DIAGNOSIS — M792 Neuralgia and neuritis, unspecified: Secondary | ICD-10-CM | POA: Diagnosis not present

## 2019-11-23 DIAGNOSIS — M79672 Pain in left foot: Secondary | ICD-10-CM | POA: Diagnosis not present

## 2019-11-23 DIAGNOSIS — M722 Plantar fascial fibromatosis: Secondary | ICD-10-CM | POA: Diagnosis not present

## 2019-11-30 ENCOUNTER — Other Ambulatory Visit: Payer: Self-pay

## 2019-11-30 ENCOUNTER — Ambulatory Visit: Payer: 59 | Admitting: Nurse Practitioner

## 2019-11-30 ENCOUNTER — Encounter: Payer: Self-pay | Admitting: Nurse Practitioner

## 2019-11-30 ENCOUNTER — Ambulatory Visit
Admission: RE | Admit: 2019-11-30 | Discharge: 2019-11-30 | Disposition: A | Discharge status: Home / Self Care | Payer: 59 | Source: Ambulatory Visit | Attending: Nurse Practitioner | Admitting: Nurse Practitioner

## 2019-11-30 ENCOUNTER — Ambulatory Visit (INDEPENDENT_AMBULATORY_CARE_PROVIDER_SITE_OTHER): Payer: 59

## 2019-11-30 VITALS — BP 130/76 | HR 104 | Temp 98.3°F | Ht 63.0 in | Wt 204.0 lb

## 2019-11-30 DIAGNOSIS — R0602 Shortness of breath: Secondary | ICD-10-CM | POA: Insufficient documentation

## 2019-11-30 DIAGNOSIS — G4452 New daily persistent headache (NDPH): Secondary | ICD-10-CM | POA: Insufficient documentation

## 2019-11-30 DIAGNOSIS — R071 Chest pain on breathing: Secondary | ICD-10-CM

## 2019-11-30 DIAGNOSIS — R0600 Dyspnea, unspecified: Secondary | ICD-10-CM

## 2019-11-30 DIAGNOSIS — R519 Headache, unspecified: Secondary | ICD-10-CM | POA: Diagnosis not present

## 2019-11-30 DIAGNOSIS — E559 Vitamin D deficiency, unspecified: Secondary | ICD-10-CM | POA: Diagnosis not present

## 2019-11-30 DIAGNOSIS — R0609 Other forms of dyspnea: Secondary | ICD-10-CM

## 2019-11-30 DIAGNOSIS — R009 Unspecified abnormalities of heart beat: Secondary | ICD-10-CM

## 2019-11-30 DIAGNOSIS — R000 Tachycardia, unspecified: Secondary | ICD-10-CM | POA: Insufficient documentation

## 2019-11-30 DIAGNOSIS — I10 Essential (primary) hypertension: Secondary | ICD-10-CM

## 2019-11-30 DIAGNOSIS — J90 Pleural effusion, not elsewhere classified: Secondary | ICD-10-CM | POA: Diagnosis not present

## 2019-11-30 LAB — CBC WITH DIFFERENTIAL/PLATELET
Basophils Absolute: 0 10*3/uL (ref 0.0–0.1)
Basophils Relative: 0.2 % (ref 0.0–3.0)
Eosinophils Absolute: 0.2 10*3/uL (ref 0.0–0.7)
Eosinophils Relative: 1.9 % (ref 0.0–5.0)
HCT: 40.5 % (ref 36.0–46.0)
Hemoglobin: 14 g/dL (ref 12.0–15.0)
Lymphocytes Relative: 23.1 % (ref 12.0–46.0)
Lymphs Abs: 2 10*3/uL (ref 0.7–4.0)
MCHC: 34.7 g/dL (ref 30.0–36.0)
MCV: 88.7 fl (ref 78.0–100.0)
Monocytes Absolute: 0.6 10*3/uL (ref 0.1–1.0)
Monocytes Relative: 6.5 % (ref 3.0–12.0)
Neutro Abs: 5.9 10*3/uL (ref 1.4–7.7)
Neutrophils Relative %: 68.3 % (ref 43.0–77.0)
Platelets: 236 10*3/uL (ref 150.0–400.0)
RBC: 4.56 Mil/uL (ref 3.87–5.11)
RDW: 12.7 % (ref 11.5–15.5)
WBC: 8.6 10*3/uL (ref 4.0–10.5)

## 2019-11-30 LAB — COMPREHENSIVE METABOLIC PANEL
ALT: 13 U/L (ref 0–35)
AST: 11 U/L (ref 0–37)
Albumin: 4.4 g/dL (ref 3.5–5.2)
Alkaline Phosphatase: 70 U/L (ref 39–117)
BUN: 9 mg/dL (ref 6–23)
CO2: 29 mEq/L (ref 19–32)
Calcium: 9.1 mg/dL (ref 8.4–10.5)
Chloride: 103 mEq/L (ref 96–112)
Creatinine, Ser: 0.84 mg/dL (ref 0.40–1.20)
GFR: 74.5 mL/min (ref >60.00)
Glucose, Bld: 93 mg/dL (ref 70–99)
Potassium: 3.7 mEq/L (ref 3.5–5.1)
Sodium: 138 mEq/L (ref 135–145)
Total Bilirubin: 0.7 mg/dL (ref 0.2–1.2)
Total Protein: 6.5 g/dL (ref 6.0–8.3)

## 2019-11-30 LAB — HEMOGLOBIN A1C: Hgb A1c MFr Bld: 5.3 % (ref 4.6–6.5)

## 2019-11-30 LAB — LIPID PANEL
Cholesterol: 134 mg/dL (ref 0–200)
HDL: 39.6 mg/dL (ref >39.00)
LDL Cholesterol: 77 mg/dL (ref 0–99)
NonHDL: 94.58
Total CHOL/HDL Ratio: 3
Triglycerides: 88 mg/dL (ref 0.0–149.0)
VLDL: 17.6 mg/dL (ref 0.0–40.0)

## 2019-11-30 LAB — B12 AND FOLATE PANEL
Folate: 20.8 ng/mL (ref >5.9)
Vitamin B-12: 257 pg/mL (ref 211–911)

## 2019-11-30 LAB — VITAMIN D 25 HYDROXY (VIT D DEFICIENCY, FRACTURES): VITD: 16.86 ng/mL — ABNORMAL LOW (ref 30.00–100.00)

## 2019-11-30 LAB — TSH: TSH: 1.73 u[IU]/mL (ref 0.35–4.50)

## 2019-11-30 NOTE — Patient Instructions (Addendum)
I have ordered a head CT stat.   Please get CXR and laboratory for routine laboratory studies today.   I will place referral to cardiology for your dyspnea on exertions symptoms x> 1 year duration.   Please go to the emergency department if your symptoms worsen- Chest pain, back pian, shortness of breath, headache or develop any neurological symptoms.   Please follow up in the office in 1 week to go over results.    General Headache Without Cause A headache is pain or discomfort felt around the head or neck area. The specific cause of a headache may not be found. There are many causes and types of headaches. A few common ones are:  Tension headaches.  Migraine headaches.  Cluster headaches.  Chronic daily headaches. Follow these instructions at home: Watch your condition for any changes. Let your health care provider know about them. Take these steps to help with your condition: Managing pain      Take over-the-counter and prescription medicines only as told by your health care provider.  Lie down in a dark, quiet room when you have a headache.  If directed, put ice on your head and neck area: ? Put ice in a plastic bag. ? Place a towel between your skin and the bag. ? Leave the ice on for 20 minutes, 2-3 times per day.  If directed, apply heat to the affected area. Use the heat source that your health care provider recommends, such as a moist heat pack or a heating pad. ? Place a towel between your skin and the heat source. ? Leave the heat on for 20-30 minutes. ? Remove the heat if your skin turns bright red. This is especially important if you are unable to feel pain, heat, or cold. You may have a greater risk of getting burned.  Keep lights dim if bright lights bother you or make your headaches worse. Eating and drinking  Eat meals on a regular schedule.  If you drink alcohol: ? Limit how much you use to:  0-1 drink a day for women.  0-2 drinks a day for  men. ? Be aware of how much alcohol is in your drink. In the [place] one drink equals one 12 oz bottle of beer (355 mL), one 5 oz glass of wine (148 mL), or one 1 oz glass of hard liquor (44 mL).  Stop drinking caffeine, or decrease the amount of caffeine you drink. General instructions   Keep a headache journal to help find out what may trigger your headaches. For example, write down: ? What you eat and drink. ? How much sleep you get. ? Any change to your diet or medicines.  Try massage or other relaxation techniques.  Limit stress.  Sit up straight, and do not tense your muscles.  Do not use any products that contain nicotine or tobacco, such as cigarettes, e-cigarettes, and chewing tobacco. If you need help quitting, ask your health care provider.  Exercise regularly as told by your health care provider.  Sleep on a regular schedule. Get 7-9 hours of sleep each night, or the amount recommended by your health care provider.  Keep all follow-up visits as told by your health care provider. This is important. Contact a health care provider if:  Your symptoms are not helped by medicine.  You have a headache that is different from the usual headache.  You have nausea or you vomit.  You have a fever. Get help right away if:  Your headache becomes severe quickly.  Your headache gets worse after moderate to intense physical activity.  You have repeated vomiting.  You have a stiff neck.  You have a loss of vision.  You have problems with speech.  You have pain in the eye or ear.  You have muscular weakness or loss of muscle control.  You lose your balance or have trouble walking.  You feel faint or pass out.  You have confusion.  You have a seizure. Summary  A headache is pain or discomfort felt around the head or neck area.  There are many causes and types of headaches. In some cases, the cause may not be found.  Keep a headache journal to help find out what  may trigger your headaches. Watch your condition for any changes. Let your health care provider know about them.  Contact a health care provider if you have a headache that is different from the usual headache, or if your symptoms are not helped by medicine.  Get help right away if your headache becomes severe, you vomit, you have a loss of vision, you lose your balance, or you have a seizure. This information is not intended to replace advice given to you by your health care provider. Make sure you discuss any questions you have with your health care provider. Document Revised: 09/14/2017 Document Reviewed: 09/14/2017 Elsevier Patient Education  [place].  Hypertension, Adult High blood pressure (hypertension) is when the force of blood pumping through the arteries is too strong. The arteries are the blood vessels that carry blood from the heart throughout the body. Hypertension forces the heart to work harder to pump blood and may cause arteries to become narrow or stiff. Untreated or uncontrolled hypertension can cause a heart attack, heart failure, a stroke, kidney disease, and other problems. A blood pressure reading consists of a higher number over a lower number. Ideally, your blood pressure should be below 120/80. The first ("top") number is called the systolic pressure. It is a measure of the pressure in your arteries as your heart beats. The second ("bottom") number is called the diastolic pressure. It is a measure of the pressure in your arteries as the heart relaxes. What are the causes? The exact cause of this condition is not known. There are some conditions that result in or are related to high blood pressure. What increases the risk? Some risk factors for high blood pressure are under your control. The following factors may make you more likely to develop this condition:  Smoking.  Having type 2 diabetes mellitus, high cholesterol, or both.  Not getting enough exercise  or physical activity.  Being overweight.  Having too much fat, sugar, calories, or salt (sodium) in your diet.  Drinking too much alcohol. Some risk factors for high blood pressure may be difficult or impossible to change. Some of these factors include:  Having chronic kidney disease.  Having a family history of high blood pressure.  Age. Risk increases with age.  Race. You may be at higher risk if you are African American.  Gender. Men are at higher risk than women before age 47. After age 76, women are at higher risk than men.  Having obstructive sleep apnea.  Stress. What are the signs or symptoms? High blood pressure may not cause symptoms. Very high blood pressure (hypertensive crisis) may cause:  Headache.  Anxiety.  Shortness of breath.  Nosebleed.  Nausea and vomiting.  Vision changes.  Severe chest pain.  Seizures. How is this diagnosed? This condition is diagnosed by measuring your blood pressure while you are seated, with your arm resting on a flat surface, your legs uncrossed, and your feet flat on the floor. The cuff of the blood pressure monitor will be placed directly against the skin of your upper arm at the level of your heart. It should be measured at least twice using the same arm. Certain conditions can cause a difference in blood pressure between your right and left arms. Certain factors can cause blood pressure readings to be lower or higher than normal for a short period of time:  When your blood pressure is higher when you are in a health care provider's office than when you are at home, this is called white coat hypertension. Most people with this condition do not need medicines.  When your blood pressure is higher at home than when you are in a health care provider's office, this is called masked hypertension. Most people with this condition may need medicines to control blood pressure. If you have a high blood pressure reading during one visit  or you have normal blood pressure with other risk factors, you may be asked to:  Return on a different day to have your blood pressure checked again.  Monitor your blood pressure at home for 1 week or longer. If you are diagnosed with hypertension, you may have other blood or imaging tests to help your health care provider understand your overall risk for other conditions. How is this treated? This condition is treated by making healthy lifestyle changes, such as eating healthy foods, exercising more, and reducing your alcohol intake. Your health care provider may prescribe medicine if lifestyle changes are not enough to get your blood pressure under control, and if:  Your systolic blood pressure is above 130.  Your diastolic blood pressure is above 80. Your personal target blood pressure may vary depending on your medical conditions, your age, and other factors. Follow these instructions at home: Eating and drinking   Eat a diet that is high in fiber and potassium, and low in sodium, added sugar, and fat. An example eating plan is called the DASH (Dietary Approaches to Stop Hypertension) diet. To eat this way: ? Eat plenty of fresh fruits and vegetables. Try to fill one half of your plate at each meal with fruits and vegetables. ? Eat whole grains, such as whole-wheat pasta, brown rice, or whole-grain bread. Fill about one fourth of your plate with whole grains. ? Eat or drink low-fat dairy products, such as skim milk or low-fat yogurt. ? Avoid fatty cuts of meat, processed or cured meats, and poultry with skin. Fill about one fourth of your plate with lean proteins, such as fish, chicken without skin, beans, eggs, or tofu. ? Avoid pre-made and processed foods. These tend to be higher in sodium, added sugar, and fat.  Reduce your daily sodium intake. Most people with hypertension should eat less than 1,500 mg of sodium a day.  Do not drink alcohol if: ? Your health care provider tells you  not to drink. ? You are pregnant, may be pregnant, or are planning to become pregnant.  If you drink alcohol: ? Limit how much you use to:  0-1 drink a day for women.  0-2 drinks a day for men. ? Be aware of how much alcohol is in your drink. In the [place] one drink equals one 12 oz bottle of beer (355 mL), one 5 oz glass  of wine (148 mL), or one 1 oz glass of hard liquor (44 mL). Lifestyle   Work with your health care provider to maintain a healthy body weight or to lose weight. Ask what an ideal weight is for you.  Get at least 30 minutes of exercise most days of the week. Activities may include walking, swimming, or biking.  Include exercise to strengthen your muscles (resistance exercise), such as Pilates or lifting weights, as part of your weekly exercise routine. Try to do these types of exercises for 30 minutes at least 3 days a week.  Do not use any products that contain nicotine or tobacco, such as cigarettes, e-cigarettes, and chewing tobacco. If you need help quitting, ask your health care provider.  Monitor your blood pressure at home as told by your health care provider.  Keep all follow-up visits as told by your health care provider. This is important. Medicines  Take over-the-counter and prescription medicines only as told by your health care provider. Follow directions carefully. Blood pressure medicines must be taken as prescribed.  Do not skip doses of blood pressure medicine. Doing this puts you at risk for problems and can make the medicine less effective.  Ask your health care provider about side effects or reactions to medicines that you should watch for. Contact a health care provider if you:  Think you are having a reaction to a medicine you are taking.  Have headaches that keep coming back (recurring).  Feel dizzy.  Have swelling in your ankles.  Have trouble with your vision. Get help right away if you:  Develop a severe headache or  confusion.  Have unusual weakness or numbness.  Feel faint.  Have severe pain in your chest or abdomen.  Vomit repeatedly.  Have trouble breathing. Summary  Hypertension is when the force of blood pumping through your arteries is too strong. If this condition is not controlled, it may put you at risk for serious complications.  Your personal target blood pressure may vary depending on your medical conditions, your age, and other factors. For most people, a normal blood pressure is less than 120/80.  Hypertension is treated with lifestyle changes, medicines, or a combination of both. Lifestyle changes include losing weight, eating a healthy, low-sodium diet, exercising more, and limiting alcohol. This information is not intended to replace advice given to you by your health care provider. Make sure you discuss any questions you have with your health care provider. Document Revised: 11/04/2017 Document Reviewed: 11/04/2017 Elsevier Patient Education  [place].  Nonspecific Chest Pain, Adult Chest pain can be caused by many different conditions. It can be caused by a condition that is life-threatening and requires treatment right away. It can also be caused by something that is not life-threatening. If you have chest pain, it can be hard to know the difference, so it is important to get help right away to make sure that you do not have a serious condition. Some life-threatening causes of chest pain include:  Heart attack.  A tear in the body's main blood vessel (aortic dissection).  Inflammation around your heart (pericarditis).  A problem in the lungs, such as a blood clot (pulmonary embolism) or a collapsed lung (pneumothorax). Some non life-threatening causes of chest pain include:  Heartburn.  Anxiety or stress.  Damage to the bones, muscles, and cartilage that make up your chest wall.  Pneumonia or bronchitis.  Shingles infection (varicella-zoster virus). Chest  pain can feel like:  Pain or  discomfort on the surface of your chest or deep in your chest.  Crushing, pressure, aching, or squeezing pain.  Burning or tingling.  Dull or sharp pain that is worse when you move, cough, or take a deep breath.  Pain or discomfort that is also felt in your back, neck, jaw, shoulder, or arm, or pain that spreads to any of these areas. Your chest pain may come and go. It may also be constant. Your health care provider will do lab tests and other studies to find the cause of your pain. Treatment will depend on the cause of your chest pain. Follow these instructions at home: Medicines  Take over-the-counter and prescription medicines only as told by your health care provider.  If you were prescribed an antibiotic, take it as told by your health care provider. Do not stop taking the antibiotic even if you start to feel better. Lifestyle   Rest as directed by your health care provider.  Do not use any products that contain nicotine or tobacco, such as cigarettes and e-cigarettes. If you need help quitting, ask your health care provider.  Do not drink alcohol.  Make healthy lifestyle choices as recommended. These may include: ? Getting regular exercise. Ask your health care provider to suggest some activities that are safe for you. ? Eating a heart-healthy diet. This includes plenty of fresh fruits and vegetables, whole grains, low-fat (lean) protein, and low-fat dairy products. A dietitian can help you find healthy eating options. ? Maintaining a healthy weight. ? Managing any other health conditions you have, such as high blood pressure (hypertension) or diabetes. ? Reducing stress, such as with yoga or relaxation techniques. General instructions  Pay attention to any changes in your symptoms. Tell your health care provider about them or any new symptoms.  Avoid any activities that cause chest pain.  Keep all follow-up visits as told by your health care  provider. This is important. This includes visits for any further testing if your chest pain does not go away. Contact a health care provider if:  Your chest pain does not go away.  You feel depressed.  You have a fever. Get help right away if:  Your chest pain gets worse.  You have a cough that gets worse, or you cough up blood.  You have severe pain in your abdomen.  You faint.  You have sudden, unexplained chest discomfort.  You have sudden, unexplained discomfort in your arms, back, neck, or jaw.  You have shortness of breath at any time.  You suddenly start to sweat, or your skin gets clammy.  You feel nausea or you vomit.  You suddenly feel lightheaded or dizzy.  You have severe weakness, or unexplained weakness or fatigue.  Your heart begins to beat quickly, or it feels like it is skipping beats. These symptoms may represent a serious problem that is an emergency. Do not wait to see if the symptoms will go away. Get medical help right away. Call your local emergency services (911 in the [place] Do not drive yourself to the hospital. Summary  Chest pain can be caused by a condition that is serious and requires urgent treatment. It may also be caused by something that is not life-threatening.  If you have chest pain, it is very important to see your health care provider. Your health care provider may do lab tests and other studies to find the cause of your pain.  Follow your health care provider's instructions on taking medicines, making  lifestyle changes, and getting emergency treatment if symptoms become worse.  Keep all follow-up visits as told by your health care provider. This includes visits for any further testing if your chest pain does not go away. This information is not intended to replace advice given to you by your health care provider. Make sure you discuss any questions you have with your health care provider. Document Revised: 08/27/2017 Document  Reviewed: 08/27/2017 Elsevier Patient Education  2020 [employer].

## 2019-11-30 NOTE — Progress Notes (Signed)
New Patient Office Visit  Subjective:  Patient ID: Paige Hansen, female    DOB: 11-Nov-1980  Age: 39 y.o. MRN: [id number]  CC:  Chief Complaint  Patient presents with  . New Patient (Initial Visit)    establish care    HPI Paige Hansen is a 39 yo who has UC, plantar fascia tear and  presents to establish care with a  primary care provider.   Covid vaccine 11/08/2019 - her first shot- sitting and felt fine and 14 min into and turned her head and she had syncope at 14 min after the injection. She lowered herself to the floor- and it was 4 min before she fully came around. Her  BP was 150/90 and HR 150. She kept on the pulse Ox- normal sats. Her heart rate improved after 30 min to 112 and BP was 140/88. Normal  BP 110-/60-70. She had a dull HA the next day and then resolved after a couple days.   Since 11/08/2019, the HA is present 5 out of 7 days- especially when she bends over forward. Her BP higher at night 130/80. The highest diastaltic in 88, HR-110-120. No CP or SOB. She has a HA today- back of head described as a dull constant throbbing and rated 5 out of 10 scale.  No N/V and if it gets bad enough light hurts her eyes. Today her pain moderate pain- she does not wake up with it- starts when she gets up in 1-2 hours.  She has been taking ibuprofen 800 mg x 3 per day for 2 weeks and resolves the HA for 6 hours  then it returns. No hx migraines. NSAIDs hurt stomach. Tylenol does not touch the pain.  Pregnancy 2010 and had anxiety issues and was placed on gabapentin 100 mg. She was originally against the vaccine  and  got religious exempt so she would not need to have the vaccine.  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  Since she has had the vaccine, she has been having headaches.   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 She thinks she had Covid last March 2020 with acute SOB and low sats,  fever x 6 days 101.9, could not take a few steps without DOE and back to work after 14 days and was never tested. In June, 2020 , she had the same symptoms with fevers, SOB, thoracic back pain, no CXR and all 3 tests negative for Covid. Since last year, she can get SOB with exertion, walking up a flight of stairs, carrying a load of laundry, and still hurts in right thoracic back if she takes a deep breath->1 year duration. Non smoker. No hx of asthma.  No calf pain swelling.  She has never used tobacco products.  Ulcerative colitis: History of UC and has been in remission.  She thinks she is due for repeat colonoscopy in 1-2 years. Followed by Dr. Havery Moros at [facility] and not on UC meds- pervious on Lialda 1000 mg daily.  No GI concerns except NSAIDs bother her stomach.   Immunizations: None in the system except Diet:normal  Exercise: walks Colonoscopy: Up-to-date Pap Smear: UTD- due now GYN Dr. Matthew Saras  Coils in fallopian tube- ESSURE  Mammogram: Vision; >2 years- advised Dentist: UTD Smoking: never Alcohol: rare   Past Medical History:  Diagnosis Date  . Allergy   .  Anxiety   . Colitis   . Depression   . Frequent headaches   . GERD (gastroesophageal reflux disease)   . Hemorrhoids     Past Surgical History:  Procedure Laterality Date  . COLONOSCOPY    . none    . WISDOM TOOTH EXTRACTION     1998    Family History  Problem Relation Age of Onset  . Breast cancer Maternal Grandmother   . Arthritis Maternal Grandmother   . Cancer Maternal Grandmother   . Hyperlipidemia Maternal Grandmother   . Hypertension Maternal Grandmother   . Kidney disease Maternal Grandmother   . Diabetes Mother   . Hyperlipidemia Mother   . Hypertension Mother   . Alcohol abuse Father   . Alcohol abuse Brother   . Asthma Brother   . Asthma Daughter   . Intellectual disability Daughter   . Arthritis Maternal Grandfather   . Cancer Maternal Grandfather   . Diabetes Maternal Grandfather   .  Hearing loss Maternal Grandfather   . Heart disease Maternal Grandfather   . Hyperlipidemia Maternal Grandfather   . Hypertension Maternal Grandfather   . Kidney disease Maternal Grandfather   . Heart attack Maternal Grandfather   . Diabetes Paternal Grandmother   . Heart disease Paternal Grandmother   . Hypertension Paternal Grandmother   . Colon cancer Neg Hx   . Esophageal cancer Neg Hx   . Rectal cancer Neg Hx   . Stomach cancer Neg Hx   . Colon polyps Neg Hx     Social History   Socioeconomic History  . Marital status: Married    Spouse name: Not on file  . Number of children: 2  . Years of education: Not on file  . Highest education level: Not on file  Occupational History  . Occupation: [employer]    Employer: [employer]  Tobacco Use  . Smoking status: Never Smoker  . Smokeless tobacco: Never Used  Vaping Use  . Vaping Use: Never used  Substance and Sexual Activity  . Alcohol use: No  . Drug use: No  . Sexual activity: Yes    Birth control/protection: Implant    Comment: ensure impant  Other Topics Concern  . Not on file  Social History Narrative  . Not on file   Social Determinants of Health   Financial Resource Strain:   . Difficulty of Paying Living Expenses: Not on file  Food Insecurity:   . Worried About Charity fundraiser in the Last Year: Not on file  . Ran Out of Food in the Last Year: Not on file  Transportation Needs:   . Lack of Transportation (Medical): Not on file  . Lack of Transportation (Non-Medical): Not on file  Physical Activity:   . Days of Exercise per Week: Not on file  . Minutes of Exercise per Session: Not on file  Stress:   . Feeling of Stress : Not on file  Social Connections:   . Frequency of Communication with Friends and Family: Not on file  . Frequency of Social Gatherings with Friends and Family: Not on file  . Attends Religious Services: Not on file  . Active Member of Clubs or Organizations: Not on file  . Attends English as a second language teacher Meetings: Not on file  . Marital Status: Not on file  Intimate Partner Violence:   . Fear of Current or Ex-Partner: Not on file  . Emotionally Abused: Not on file  . Physically Abused: Not on file  .  Sexually Abused: Not on file    Review of Systems  Constitutional: Negative.   HENT: Negative.   Eyes: Negative.   Respiratory: Positive for shortness of breath.   Gastrointestinal: Negative.   Endocrine: Negative.   Musculoskeletal: Positive for back pain.  Skin: Negative.   Allergic/Immunologic: Negative.   Hematological: Negative.   Psychiatric/Behavioral: Negative.     Objective:   Today's Vitals: BP 130/76 (BP Location: Left Arm, Patient Position: Sitting, Cuff Size: Normal)   Pulse (!) 104   Temp 98.3 F (36.8 C) (Oral)   Ht 5' 3"  (1.6 m)   Wt 204 lb (92.5 kg)   SpO2 98%   BMI 36.14 kg/m   Physical Exam Vitals reviewed.  Constitutional:      Appearance: She is obese.  HENT:     Head: Normocephalic and atraumatic.  Eyes:     Conjunctiva/sclera: Conjunctivae normal.     Pupils: Pupils are equal, round, and reactive to light.  Cardiovascular:     Rate and Rhythm: Normal rate and regular rhythm.     Pulses: Normal pulses.     Heart sounds: Normal heart sounds.  Pulmonary:     Effort: Pulmonary effort is normal.     Breath sounds: Normal breath sounds. No wheezing or rhonchi.  Chest:     Chest wall: No tenderness.  Abdominal:     Palpations: Abdomen is soft.     Tenderness: There is no abdominal tenderness.  Musculoskeletal:        General: Normal range of motion.     Cervical back: Normal range of motion and neck supple.  Skin:    General: Skin is warm and dry.  Neurological:     General: No focal deficit present.     Mental Status: She is alert and oriented to person, place, and time.  Psychiatric:        Mood and Affect: Mood normal.        Behavior: Behavior normal.        Thought Content: Thought content normal.        Judgment:  Judgment normal.     Assessment & Plan:   Problem List Items Addressed This Visit      Cardiovascular and Mediastinum   Elevated heart rate with elevated blood pressure and diagnosis of hypertension     Other   New daily persistent headache - Primary   Relevant Orders   CT Head Wo Contrast (Completed)   Chest pain on breathing   Relevant Orders   DG Chest 2 View (Completed)   EKG 12-Lead (Completed)   CBC with Differential/Platelet (Completed)   TSH (Completed)   Comprehensive metabolic panel (Completed)   Hemoglobin A1c (Completed)   Lipid panel (Completed)   VITAMIN D 25 Hydroxy (Vit-D Deficiency, Fractures) (Completed)   B12 and Folate Panel (Completed)   DOE (dyspnea on exertion)   Vitamin D deficiency   Relevant Orders   VITAMIN D 25 Hydroxy (Vit-D Deficiency, Fractures)      Outpatient Encounter Medications as of 11/30/2019  Medication Sig  . calcium carbonate (TUMS - DOSED IN MG ELEMENTAL CALCIUM) 500 MG chewable tablet Chew 1 tablet by mouth daily.  . Vitamin D, Ergocalciferol, (DRISDOL) 1.25 MG (50000 UNIT) CAPS capsule Take 1 capsule (50,000 Units total) by mouth every 7 (seven) days.  . [DISCONTINUED] calcium carbonate (TUMS - DOSED IN MG ELEMENTAL CALCIUM) 500 MG chewable tablet Chew 3 tablets by mouth as needed for indigestion or heartburn.  . [DISCONTINUED]  ibuprofen (ADVIL,MOTRIN) 200 MG tablet Take 800 mg by mouth every 6 (six) hours as needed. Pain  . [DISCONTINUED] pantoprazole (PROTONIX) 40 MG tablet Take 1 tablet (40 mg total) by mouth every morning. Take  30 minutes before breakfast. (Patient not taking: Reported on 05/06/2018)   Facility-Administered Encounter Medications as of 11/30/2019  Medication  . 0.9 %  sodium chloride infusion   New onset HA: She reports a posterior throbbing dull type headache since she had her first Covid vaccine on 11/08/2019.  She had a syncopal episode 14 minutes after the vaccine.  She has mild light sensitivity at times,  and occasional blurred vision.  No history of CVA, brain aneurysm, cranial surgery or seizures.  She relieves the headache for short durations with ibuprofen.  She does not have a history of migraine headaches.  Plan: I have ordered a head CT stat. She was advised to try Tylenol Arthritis for her headache.  NSAIDs are not recommended for people with ulcerative colitis or any type of inflammatory bowel disease.  We can consider muscle relaxer for her headache as well. This may be tension HA, rebound HA with frequent analgesic use, new onset migraine, and need to r/out more serious etiologies such as space occupying lesion, subdural bleed, chiari malformation.No cervical neck pain, tenderness and normal exam.  DOE/new onset tachycardia/fleeting mid thoracic back pain with deep inspiration. No cough/SOB at rest/wheezing and lung exam is normal with pulse ox 98%. Plan: CXR for > 1 year hx of and laboratory for routine laboratory studies today. I will place referral to cardiology for your dyspnea on exertion symptoms x> 1 year duration.   EKG was performed today for patient report of dyspnea on exertion, and tachycardia.The study was read by me as normal sinus rhythm with ventricular rate 83. PR 150 ms, QT 362 ms, QTcB 425 ms.  No arrhythmia, no ST-T wave changes, no acute changes.  This is a normal EKG.  No prior EKG to compare.  Please go to the emergency department if your symptoms worsen- Chest pain, back pian, shortness of breath, headache or develop any neurological symptoms.   Please follow up in the office in 1 week to go over results.   Follow-up: Return in about 1 week (around 12/07/2019).  This visit occurred during the SARS-CoV-2 public health emergency.  Safety protocols were in place, including screening questions prior to the visit, additional usage of staff PPE, and extensive cleaning of exam room while observing appropriate contact time as indicated for disinfecting solutions.   Denice Paradise, NP

## 2019-12-01 MED ORDER — VITAMIN D (ERGOCALCIFEROL) 1.25 MG (50000 UNIT) PO CAPS: 50000.0000 [IU] | ORAL_CAPSULE | ORAL | 0 refills | Status: DC

## 2019-12-02 DIAGNOSIS — E559 Vitamin D deficiency, unspecified: Secondary | ICD-10-CM | POA: Insufficient documentation

## 2019-12-02 MED FILL — VIT D2 1.25 MG (50,000 UNIT: 1.25 MG | 56 days supply | Qty: 8 | Fill #0

## 2019-12-02 NOTE — Addendum Note (Signed)
Addended by: Denice Paradise A on: 12/02/2019 12:59 PM   Modules accepted: Orders

## 2019-12-07 DIAGNOSIS — M722 Plantar fascial fibromatosis: Secondary | ICD-10-CM | POA: Diagnosis not present

## 2019-12-07 DIAGNOSIS — M79672 Pain in left foot: Secondary | ICD-10-CM | POA: Diagnosis not present

## 2019-12-07 DIAGNOSIS — M792 Neuralgia and neuritis, unspecified: Secondary | ICD-10-CM | POA: Diagnosis not present

## 2019-12-07 DIAGNOSIS — M7732 Calcaneal spur, left foot: Secondary | ICD-10-CM | POA: Diagnosis not present

## 2019-12-09 ENCOUNTER — Encounter: Payer: Self-pay | Admitting: Nurse Practitioner

## 2019-12-09 ENCOUNTER — Ambulatory Visit (INDEPENDENT_AMBULATORY_CARE_PROVIDER_SITE_OTHER): Payer: 59 | Admitting: Nurse Practitioner

## 2019-12-09 ENCOUNTER — Other Ambulatory Visit: Payer: Self-pay | Admitting: Nurse Practitioner

## 2019-12-09 ENCOUNTER — Ambulatory Visit (INDEPENDENT_AMBULATORY_CARE_PROVIDER_SITE_OTHER): Payer: 59

## 2019-12-09 ENCOUNTER — Other Ambulatory Visit: Payer: Self-pay

## 2019-12-09 VITALS — BP 130/76 | HR 101 | Temp 98.5°F | Ht 63.0 in | Wt 206.0 lb

## 2019-12-09 DIAGNOSIS — R519 Headache, unspecified: Secondary | ICD-10-CM | POA: Diagnosis not present

## 2019-12-09 DIAGNOSIS — G4452 New daily persistent headache (NDPH): Secondary | ICD-10-CM

## 2019-12-09 DIAGNOSIS — M722 Plantar fascial fibromatosis: Secondary | ICD-10-CM | POA: Diagnosis not present

## 2019-12-09 DIAGNOSIS — R0600 Dyspnea, unspecified: Secondary | ICD-10-CM | POA: Diagnosis not present

## 2019-12-09 DIAGNOSIS — R0609 Other forms of dyspnea: Secondary | ICD-10-CM

## 2019-12-09 MED ORDER — METOPROLOL SUCCINATE ER 25 MG PO TB24: 25.0000 mg | ORAL_TABLET | Freq: Every day | ORAL | 1 refills | Status: DC

## 2019-12-09 MED ORDER — TRAMADOL HCL 50 MG PO TABS: 50.0000 mg | ORAL_TABLET | Freq: Three times a day (TID) | ORAL | 0 refills | Status: AC | PRN

## 2019-12-09 NOTE — Telephone Encounter (Signed)
I LMOM that I gave more instructions on AVS and sent her an addendum.

## 2019-12-09 NOTE — Patient Instructions (Addendum)
Please get the Brain MRI as planned and I will call with results. You have requested next Wed or Fri.  This is scheduled for 12/21/2019 at 330.  [facility]ology [facility] at [facility] stop the pain information just there and someone will escort you to the MRI.    I ordered a c-spine film  today since you have tenderness base of skull.   Further recommendations in place of Ibuprofen after look at C spine.   Neurology consult ordered for daily posterior head pain and associated blurred vision.  Please see your eye doctor for an exam, as well.   Please see Cardiology for shortness of breath with exertion and rapid heart rate.   Your vitamin D is 1 pill every 7 days and you may take that  in 2 weeks.  Please stop taking the ibuprofen.   Begin Toprol XL 25 mg 1 tablet daily.  This is used to help prevent headaches, will slow the heart rate, and affect the blood pressure just slightly.  Begin tramadol 50 mg 1 tablet every 8 hours for pain relief as needed up to 5 days  since you should no longer be taking ibuprofen.  Try taking this when you are not working or driving to see how he will react to it.  This may or may not make you drowsy. This is a controlled substance and amount is limited.   I spoke with the radiologist, and the recommendation is to do an MRI with contrast and an MRA.  We are going to have to change your MRI order and this may change the date you get the procedure.  We will handle this on Monday and let  you know.

## 2019-12-09 NOTE — Progress Notes (Signed)
Established Patient Office Visit  Subjective:  Patient ID: Paige Hansen, female    DOB: 1981/01/28  Age: 39 y.o. MRN: [id number]  CC:  Chief Complaint  Patient presents with  . Follow-up    headaches   HPI Paige Hansen is a 39 yo who established care on 11/30/2019. She reported history of daily HA that started 2  hours following her first [place] on 11/08/2019. She had syncope at 14 min after the injection.She had her second Covid shot on 2 days ago. It did not affect the HA. She did not pass out with this shot. She did have a  fever Tmax 102.7 /chills for 24 hours after this shot. Now, her temp is normal.  No neck pain or stiffness. A CT of head without contrast on 9/22/201 was normal.   She continues to have a daily posterior dull headache that  is an 8 out of 10 scale and resolves for 5.5 -6 hours  after Ibuprofen 800 mg and then returns. This comes back 2-3 times as the medication wears off. She has tried Tylenol 1 gm every 6 hours for 2-3 days does not touch it. No hx of migraines, aura, photophobia, nausea/vomiting. She does have slight blurred vision when the HA is bad. She reports a pounding quality of the HA if she goes to bed with it.   She can recall a HA - persistent about 10- 20 years ago and negative work up for MS. The HA was not as bad as now and seemed to be around her menses. She can recall getting struck in the head with a softball in high school, but did not have a hx of loss of consciousness, concussion, to HA afterward.   Plantar fascitis: Left foot and followed by Ortho- wearing a full size left leg boot in place of cast for 2 weeks.   Ulcerative Colitis: In remission and takes no medication. She is aware the NSAIDs flare UC and is starting to get a mild abdominal pain- no diarrhea. Last saw Tye Savoy, NP in GI 06/30/2017 for nausea. Underwent EGD 08/28/2017: 2 cm HH, normal  stomach and esophagus. Neg H pylori, RUQ showed no gallstones. HIDA  09/28/2017: Normal.    DOE: Still has DOE and walk up steps- 2 flights and then has to stop since March of 2020. No CP.  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 She thinks she had Covid March 2020- never tested. She gained weight. Tried phentermine- and stopped taking  it in June 2021 when insurance no longer covered it.  She lost 50 lbs from Oct 2020- June 2021. Wt 230 lb down to 180 lbs and gained back.   Wt Readings from Last 3 Encounters:  12/09/19 206 lb (93.4 kg)  11/30/19 204 lb (92.5 kg)  06/14/18 205 lb 12.8 oz (93.4 kg)    Past Medical History:  Diagnosis Date  . Allergy   . Anxiety   . Colitis   . Depression   . Frequent headaches   . GERD (gastroesophageal reflux disease)   . Hemorrhoids     Past Surgical History:  Procedure Laterality Date  . COLONOSCOPY    . none    . WISDOM TOOTH EXTRACTION     1998    Family History  Problem Relation Age of Onset  . Breast cancer Maternal Grandmother   . Arthritis Maternal Grandmother   . Cancer  Maternal Grandmother   . Hyperlipidemia Maternal Grandmother   . Hypertension Maternal Grandmother   . Kidney disease Maternal Grandmother   . Diabetes Mother   . Hyperlipidemia Mother   . Hypertension Mother   . Alcohol abuse Father   . Alcohol abuse Brother   . Asthma Brother   . Asthma Daughter   . Intellectual disability Daughter   . Arthritis Maternal Grandfather   . Cancer Maternal Grandfather   . Diabetes Maternal Grandfather   . Hearing loss Maternal Grandfather   . Heart disease Maternal Grandfather   . Hyperlipidemia Maternal Grandfather   . Hypertension Maternal Grandfather   . Kidney disease Maternal Grandfather   . Heart attack Maternal Grandfather   . Diabetes Paternal Grandmother   . Heart disease Paternal Grandmother   . Hypertension Paternal Grandmother   . Colon cancer Neg Hx   . Esophageal cancer Neg Hx   . Rectal cancer Neg Hx     . Stomach cancer Neg Hx   . Colon polyps Neg Hx     Social History   Socioeconomic History  . Marital status: Married    Spouse name: Not on file  . Number of children: 2  . Years of education: Not on file  . Highest education level: Not on file  Occupational History  . Occupation: [employer]    Employer: [employer]  Tobacco Use  . Smoking status: Never Smoker  . Smokeless tobacco: Never Used  Vaping Use  . Vaping Use: Never used  Substance and Sexual Activity  . Alcohol use: No  . Drug use: No  . Sexual activity: Yes    Birth control/protection: Implant    Comment: ensure impant  Other Topics Concern  . Not on file  Social History Narrative  . Not on file   Social Determinants of Health   Financial Resource Strain:   . Difficulty of Paying Living Expenses: Not on file  Food Insecurity:   . Worried About Charity fundraiser in the Last Year: Not on file  . Ran Out of Food in the Last Year: Not on file  Transportation Needs:   . Lack of Transportation (Medical): Not on file  . Lack of Transportation (Non-Medical): Not on file  Physical Activity:   . Days of Exercise per Week: Not on file  . Minutes of Exercise per Session: Not on file  Stress:   . Feeling of Stress : Not on file  Social Connections:   . Frequency of Communication with Friends and Family: Not on file  . Frequency of Social Gatherings with Friends and Family: Not on file  . Attends Religious Services: Not on file  . Active Member of Clubs or Organizations: Not on file  . Attends Archivist Meetings: Not on file  . Marital Status: Not on file  Intimate Partner Violence:   . Fear of Current or Ex-Partner: Not on file  . Emotionally Abused: Not on file  . Physically Abused: Not on file  . Sexually Abused: Not on file    Outpatient Medications Prior to Visit  Medication Sig Dispense Refill  . calcium carbonate (TUMS - DOSED IN MG ELEMENTAL CALCIUM) 500 MG chewable tablet Chew 1 tablet by  mouth daily.    . Vitamin D, Ergocalciferol, (DRISDOL) 1.25 MG (50000 UNIT) CAPS capsule Take 1 capsule (50,000 Units total) by mouth every 7 (seven) days. 8 capsule 0  . Phentermine-Topiramate (QSYMIA) 7.5-46 MG CP24 Qsymia 7.5 mg-46 mg capsule, extended  release    . 0.9 %  sodium chloride infusion      No facility-administered medications prior to visit.    Allergies  Allergen Reactions  . Latex     Review of Systems Pertinent positives noted in HPI and otherwise negative.    Objective:    Physical Exam Vitals reviewed.  Constitutional:      Appearance: She is obese.  HENT:     Head: Normocephalic and atraumatic.  Eyes:     Extraocular Movements: Extraocular movements intact.     Conjunctiva/sclera: Conjunctivae normal.     Pupils: Pupils are equal, round, and reactive to light.  Cardiovascular:     Rate and Rhythm: Regular rhythm. Tachycardia present.     Pulses: Normal pulses.     Heart sounds: Normal heart sounds.  Pulmonary:     Effort: Pulmonary effort is normal.     Breath sounds: Normal breath sounds.  Abdominal:     Palpations: Abdomen is soft.     Tenderness: There is no abdominal tenderness.  Musculoskeletal:        General: Normal range of motion.     Cervical back: Normal range of motion and neck supple. No rigidity or tenderness.     Comments: Reports tenderness posterior head at base of skull. Neck with normal ROM .   Lymphadenopathy:     Cervical: No cervical adenopathy.  Skin:    General: Skin is warm and dry.  Neurological:     General: No focal deficit present.     Mental Status: She is alert and oriented to person, place, and time. Mental status is at baseline.     Cranial Nerves: No cranial nerve deficit.     Sensory: No sensory deficit.     Motor: No weakness.     Coordination: Coordination normal.     Gait: Gait normal.     Deep Tendon Reflexes: Reflexes normal.  Psychiatric:        Mood and Affect: Mood normal.        Behavior:  Behavior normal.     BP 130/76 (BP Location: Left Arm, Patient Position: Sitting, Cuff Size: Normal)   Pulse (!) 101   Temp 98.5 F (36.9 C) (Oral)   Ht 5' 3"  (1.6 m)   Wt 206 lb (93.4 kg)   SpO2 97%   BMI 36.49 kg/m  Wt Readings from Last 3 Encounters:  12/09/19 206 lb (93.4 kg)  11/30/19 204 lb (92.5 kg)  06/14/18 205 lb 12.8 oz (93.4 kg)   Pulse Readings from Last 3 Encounters:  12/09/19 (!) 101  11/30/19 (!) 104  08/28/17 80    BP Readings from Last 3 Encounters:  12/09/19 130/76  11/30/19 130/76  08/28/17 104/71    Lab Results  Component Value Date   CHOL 134 11/30/2019   HDL 39.60 11/30/2019   LDLCALC 77 11/30/2019   TRIG 88.0 11/30/2019   CHOLHDL 3 11/30/2019      Health Maintenance Due  Topic Date Due  . Hepatitis C Screening  Never done  . HIV Screening  Never done  . TETANUS/TDAP  Never done  . PAP SMEAR-Modifier  Never done  . COLONOSCOPY  07/15/2017  . COVID-19 Vaccine (2 - Pfizer 2-dose series) 11/29/2019    There are no preventive care reminders to display for this patient.  Lab Results  Component Value Date   TSH 1.73 11/30/2019   Lab Results  Component Value Date   WBC 8.6 11/30/2019  HGB 14.0 11/30/2019   HCT 40.5 11/30/2019   MCV 88.7 11/30/2019   PLT 236.0 11/30/2019   Lab Results  Component Value Date   NA 138 11/30/2019   K 3.7 11/30/2019   CO2 29 11/30/2019   GLUCOSE 93 11/30/2019   BUN 9 11/30/2019   CREATININE 0.84 11/30/2019   BILITOT 0.7 11/30/2019   ALKPHOS 70 11/30/2019   AST 11 11/30/2019   ALT 13 11/30/2019   PROT 6.5 11/30/2019   ALBUMIN 4.4 11/30/2019   CALCIUM 9.1 11/30/2019   GFR 74.50 11/30/2019   Lab Results  Component Value Date   CHOL 134 11/30/2019   Lab Results  Component Value Date   HDL 39.60 11/30/2019   Lab Results  Component Value Date   LDLCALC 77 11/30/2019   Lab Results  Component Value Date   TRIG 88.0 11/30/2019   Lab Results  Component Value Date   CHOLHDL 3 11/30/2019     Lab Results  Component Value Date   HGBA1C 5.3 11/30/2019      Assessment & Plan:   Problem List Items Addressed This Visit      Other   New daily persistent headache - Primary   Relevant Medications   metoprolol succinate (TOPROL-XL) 25 MG 24 hr tablet   traMADol (ULTRAM) 50 MG tablet   Other Relevant Orders   Ambulatory referral to Neurology   DG Cervical Spine Complete   MR Brain Wo Contrast   DOE (dyspnea on exertion)      Meds ordered this encounter  Medications  . metoprolol succinate (TOPROL-XL) 25 MG 24 hr tablet    Sig: Take 1 tablet (25 mg total) by mouth daily.    Dispense:  30 tablet    Refill:  1    Order Specific Question:   Supervising Provider    Answer:   Einar Pheasant [id number]  . traMADol (ULTRAM) 50 MG tablet    Sig: Take 1 tablet (50 mg total) by mouth every 8 (eight) hours as needed for up to 5 days.    Dispense:  15 tablet    Refill:  0    Order Specific Question:   Supervising Provider    Answer:   Einar Pheasant [[id number]]  Please get the Brain MRI as planned and I will call with results. You have requested next Wed or Fri.   I ordered a c-spine film  today since you have tenderness base of skull.   Further recommendations in place of Ibuprofen after look at C spine.   Neurology consult ordered for daily posterior head pain and associated blurred vision.  Please see your eye doctor for an exam, as well.   Please see Cardiology for shortness of breath with exertion and rapid heart rate. Hx of phentermine use. Hx of possible Covid infection march 2020 per patient report. Referral in process from last visit.  Your vitamin D is 1 pill every 7 days and you may take that  in 2 weeks.  Please stop taking the ibuprofen.   Begin Toprol XL 25 mg 1 tablet daily.  This is used to help prevent headaches, will slow the heart rate, and affect the blood pressure just slightly.  Begin tramadol 50 mg 1 tablet every 8 hours for pain relief as needed up  to 5 days  since you should no longer be taking ibuprofen.  Try taking this when you are not working or driving to see how he will react to it.  This may  or may not make you drowsy. This is a controlled substance and amount is limited.   Addendum: I spoke with the radiologist, and the recommendation is to do an MRI with contrast and an MRA.  We are going to have to change your MRI order and this may change the date you get the procedure.  We will handle this on Monday and let  you know.   Follow-up: Return in about 2 weeks (around 12/23/2019).  This visit occurred during the SARS-CoV-2 public health emergency.  Safety protocols were in place, including screening questions prior to the visit, additional usage of staff PPE, and extensive cleaning of exam room while observing appropriate contact time as indicated for disinfecting solutions.    Denice Paradise, NP

## 2019-12-10 MED FILL — traMADol HCL 50 MG TABS: 50 | 3 days supply | Qty: 15 | Fill #0

## 2019-12-10 MED FILL — METOPROLOL SUCCINATE ER 25: 25 | 30 days supply | Qty: 30 | Fill #0

## 2019-12-11 ENCOUNTER — Encounter: Payer: Self-pay | Admitting: Nurse Practitioner

## 2019-12-12 NOTE — Addendum Note (Signed)
Addended by: Denice Paradise A on: 12/12/2019 09:21 AM   Modules accepted: Orders

## 2019-12-12 NOTE — Telephone Encounter (Signed)
I spoke with Neurology Radiologist about her first Covid vaccine and syncope 14 min after the shot. Posterior HA followed 2 hours later and persists 3 weeks relieved with Ibuprofen 800 mg and then returns when medication wears off. No Neurology defects on exam. She reports blurred vision bilat  when the HA gets more painful. The head CT WO contrast was normal.   PLAN; Neurology Radiologist  recommended MRI WO contrast and MRA WO contrast. The pt is aware that we will be calling with an appt.  I placed Neurology referral last week. She was given Toprol XL 25 mg daily and Tramadol for HA management. OV with me 12/27/19.

## 2019-12-21 ENCOUNTER — Ambulatory Visit (HOSPITAL_COMMUNITY): Payer: 59

## 2019-12-22 ENCOUNTER — Other Ambulatory Visit: Payer: Self-pay

## 2019-12-23 ENCOUNTER — Encounter: Payer: Self-pay | Admitting: Cardiology

## 2019-12-23 ENCOUNTER — Ambulatory Visit: Payer: 59 | Admitting: Cardiology

## 2019-12-23 VITALS — BP 116/90 | HR 90 | Ht 62.0 in | Wt 202.0 lb

## 2019-12-23 DIAGNOSIS — R0600 Dyspnea, unspecified: Secondary | ICD-10-CM | POA: Diagnosis not present

## 2019-12-23 DIAGNOSIS — R072 Precordial pain: Secondary | ICD-10-CM | POA: Diagnosis not present

## 2019-12-23 DIAGNOSIS — R079 Chest pain, unspecified: Secondary | ICD-10-CM | POA: Diagnosis not present

## 2019-12-23 DIAGNOSIS — Z6836 Body mass index (BMI) 36.0-36.9, adult: Secondary | ICD-10-CM

## 2019-12-23 DIAGNOSIS — R0609 Other forms of dyspnea: Secondary | ICD-10-CM

## 2019-12-23 DIAGNOSIS — I10 Essential (primary) hypertension: Secondary | ICD-10-CM

## 2019-12-23 MED ORDER — METOPROLOL TARTRATE 100 MG PO TABS: ORAL_TABLET | ORAL | 0 refills | Status: DC

## 2019-12-23 NOTE — Progress Notes (Signed)
Cardiology Office Note:    Date:  12/23/2019   ID:  Paige Hansen, DOB 10/04/1980, MRN [id number]  PCP:  Marval Regal, NP  [facility] Cardiologist:  Kate Sable, MD  [place] Electrophysiologist:  None   Referring MD: Marval Regal, NP   Chief Complaint  Patient presents with  . New Patient (Initial Visit)    Referred by PCP for DOE, Elevated BP and Heart rate. Meds reviewed Verbally with patient.    Paige Hansen is a 39 y.o. female who is being seen today for the evaluation of dyspnea on exertion at the request of Marval Regal, NP.   History of Present Illness:    Paige Hansen is a 39 y.o. female with a hx of obesity, hypertension who presents due to shortness of breath with exertion.  Patient states having shortness of breath with exertion for about a year now.  Symptoms seems to have worsened over the past couple of months.  She thinks she developed COVID-19 infection back in March 2020.  States having fevers chills shortness of breath at the time.  She also states gaining weight, took phentermine for about 10 months and stopped June 2021.  Her heart rates and blood pressure were elevated over the past 3 to 4 months.  She was started on Toprol-XL to help with both heart rates and blood pressure.  This seems to have helped her heart rate, currently in the 90s.  Whenever she exerts herself, she gets out of breath, also associated with right-sided chest pain.  Symptoms are resolved with rest.  She denies dizziness, syncope.  Usually has edema after being on her feet for too long.  Past Medical History:  Diagnosis Date  . Allergy   . Anxiety   . Colitis   . Depression   . Frequent headaches   . GERD (gastroesophageal reflux disease)   . Hemorrhoids     Past Surgical History:  Procedure Laterality Date  . COLONOSCOPY    . none    . WISDOM TOOTH EXTRACTION     1998    Current Medications: Current Meds  Medication Sig  . calcium  carbonate (TUMS - DOSED IN MG ELEMENTAL CALCIUM) 500 MG chewable tablet Chew 1 tablet by mouth daily.  . metoprolol succinate (TOPROL-XL) 25 MG 24 hr tablet Take 1 tablet (25 mg total) by mouth daily.  . Vitamin D, Ergocalciferol, (DRISDOL) 1.25 MG (50000 UNIT) CAPS capsule Take 1 capsule (50,000 Units total) by mouth every 7 (seven) days.     Allergies:   Latex   Social History   Socioeconomic History  . Marital status: Married    Spouse name: Not on file  . Number of children: 2  . Years of education: Not on file  . Highest education level: Not on file  Occupational History  . Occupation: [employer]    Employer: [employer]  Tobacco Use  . Smoking status: Never Smoker  . Smokeless tobacco: Never Used  Vaping Use  . Vaping Use: Never used  Substance and Sexual Activity  . Alcohol use: No  . Drug use: No  . Sexual activity: Yes    Birth control/protection: Implant    Comment: ensure impant  Other Topics Concern  . Not on file  Social History Narrative  . Not on file   Social Determinants of Health   Financial Resource Strain:   . Difficulty of Paying Living Expenses: Not on file  Food Insecurity:   .  Worried About Charity fundraiser in the Last Year: Not on file  . Ran Out of Food in the Last Year: Not on file  Transportation Needs:   . Lack of Transportation (Medical): Not on file  . Lack of Transportation (Non-Medical): Not on file  Physical Activity:   . Days of Exercise per Week: Not on file  . Minutes of Exercise per Session: Not on file  Stress:   . Feeling of Stress : Not on file  Social Connections:   . Frequency of Communication with Friends and Family: Not on file  . Frequency of Social Gatherings with Friends and Family: Not on file  . Attends Religious Services: Not on file  . Active Member of Clubs or Organizations: Not on file  . Attends Archivist Meetings: Not on file  . Marital Status: Not on file     Family History: The patient's family  history includes Alcohol abuse in her brother and father; Arthritis in her maternal grandfather and maternal grandmother; Asthma in her brother and daughter; Breast cancer in her maternal grandmother; Cancer in her maternal grandfather and maternal grandmother; Diabetes in her maternal grandfather, mother, and paternal grandmother; Hearing loss in her maternal grandfather; Heart attack in her maternal grandfather; Heart disease in her maternal grandfather and paternal grandmother; Hyperlipidemia in her maternal grandfather, maternal grandmother, and mother; Hypertension in her maternal grandfather, maternal grandmother, mother, and paternal grandmother; Intellectual disability in her daughter; Kidney disease in her maternal grandfather and maternal grandmother. There is no history of Colon cancer, Esophageal cancer, Rectal cancer, Stomach cancer, or Colon polyps.  ROS:   Please see the history of present illness.     All other systems reviewed and are negative.  EKGs/Labs/Other Studies Reviewed:    The following studies were reviewed today:   EKG:  EKG is  ordered today.  The ekg ordered today demonstrates normal sinus rhythm, normal ECG.  Recent Labs: 11/30/2019: ALT 13; BUN 9; Creatinine, Ser 0.84; Hemoglobin 14.0; Platelets 236.0; Potassium 3.7; Sodium 138; TSH 1.73  Recent Lipid Panel    Component Value Date/Time   CHOL 134 11/30/2019 1116   TRIG 88.0 11/30/2019 1116   HDL 39.60 11/30/2019 1116   CHOLHDL 3 11/30/2019 1116   VLDL 17.6 11/30/2019 1116   LDLCALC 77 11/30/2019 1116     Risk Assessment/Calculations:      Physical Exam:    VS:  BP 116/90 (BP Location: Right Arm, Patient Position: Sitting, Cuff Size: Normal)   Pulse 90   Ht _0  (1.575 m)   Wt 202 lb (91.6 kg)   SpO2 97%   BMI 36.95 kg/m     Wt Readings from Last 3 Encounters:  12/23/19 202 lb (91.6 kg)  12/09/19 206 lb (93.4 kg)  11/30/19 204 lb (92.5 kg)     GEN:  Well nourished, well developed in no  acute distress HEENT: Normal NECK: No JVD; No carotid bruits LYMPHATICS: No lymphadenopathy CARDIAC: RRR, no murmurs, rubs, gallops RESPIRATORY:  Clear to auscultation without rales, wheezing or rhonchi  ABDOMEN: Soft, non-tender, non-distended MUSCULOSKELETAL:  No edema; No deformity  SKIN: Warm and dry NEUROLOGIC:  Alert and oriented x 3 PSYCHIATRIC:  Normal affect   ASSESSMENT:    1. Dyspnea on exertion   2. Chest pain of uncertain etiology   3. Primary hypertension   4. BMI 36.0-36.9,adult   5. Precordial pain    PLAN:    In order of problems listed above:  1.  Patient with worsening dyspnea on exertion.  Has risk factors of obesity, hypertension.  Get echocardiogram to evaluate cardiac function.  Obtain coronary CTA to evaluate presence of CAD. 2. Patient with chest pain, right-sided sometimes associated with exertion.  Echo and coronary CTA as above. 3. History of hypertension, BP improved, diastolic still elevated.  Continue Toprol-XL for now. 4. Patient is obese, low-calorie diet, weight loss advised.  Follow-up after echo and coronary CTA.   Medication Adjustments/Labs and Tests Ordered: Current medicines are reviewed at length with the patient today.  Concerns regarding medicines are outlined above.  Orders Placed This Encounter  Procedures  . CT CORONARY MORPH W/CTA COR W/SCORE W/CA W/CM &/OR WO/CM  . CT CORONARY FRACTIONAL FLOW RESERVE DATA PREP  . CT CORONARY FRACTIONAL FLOW RESERVE FLUID ANALYSIS  . EKG 12-Lead  . ECHOCARDIOGRAM COMPLETE   Meds ordered this encounter  Medications  . metoprolol tartrate (LOPRESSOR) 100 MG tablet    Sig: Take 1 tablet (100 mg) by mouth 2 hours prior to your Cardiac CTA    Dispense:  1 tablet    Refill:  0    Patient Instructions  Medication Instructions:  - Your physician recommends that you continue on your current medications as directed. Please refer to the Current Medication list given to you today.  *If you need a  refill on your cardiac medications before your next appointment, please call your pharmacy*   Lab Work: - none ordered  If you have labs (blood work) drawn today and your tests are completely normal, you will receive your results only by: Marland Kitchen MyChart Message (if you have MyChart) OR . A paper copy in the mail If you have any lab test that is abnormal or we need to change your treatment, we will call you to review the results.   Testing/Procedures:  1) Echocardiogram  - Your physician has requested that you have an echocardiogram. Echocardiography is a painless test that uses sound waves to create images of your heart. It provides your doctor with information about the size and shape of your heart and how well your heart's chambers and valves are working. This procedure takes approximately one hour. There are no restrictions for this procedure. There is a possibility that an IV may need to be started during your test to inject an image enhancing agent. This is done to obtain more optimal pictures of your heart. Therefore we ask that you do at least drink some water prior to coming in to hydrate your veins.    2) Cardiac CTA - Your physician has requested that you have cardiac CT. Cardiac computed tomography (CT) is a painless test that uses an x-ray machine to take clear, detailed pictures of your heart.   Your cardiac CT will be scheduled at one of the below locations:   [facility] [address] [place], [place] [address] [phone]  [place] [address] [place], [place] [address] [phone]  If scheduled at [facility], please arrive at the [facility] main entrance of [facility] 30 minutes prior to test start time. Proceed to the [facility] Radiology Department (first floor) to check-in and test prep.  If scheduled at [facility], please arrive 15 mins early for  check-in and test prep.  Please follow these instructions carefully (unless otherwise directed):  On the Night Before the Test: . Be sure to Drink plenty of water. . Do  not consume any caffeinated/decaffeinated beverages or chocolate 12 hours prior to your test. . Do not take any antihistamines 12 hours prior to your test.   On the Day of the Test: . Drink plenty of water. Do not drink any water within one hour of the test. . Do not eat any food 4 hours prior to the test. . You may take your regular medications prior to the test.  . Take metoprolol (Lopressor) 100 mg two hours prior to test. . FEMALES- please wear underwire-free bra if available       After the Test: . Drink plenty of water. . After receiving IV contrast, you may experience a mild flushed feeling. This is normal. . On occasion, you may experience a mild rash up to 24 hours after the test. This is not dangerous. If this occurs, you can take Benadryl 25 mg and increase your fluid intake. . If you experience trouble breathing, this can be serious. If it is severe call 911 IMMEDIATELY. If it is mild, please call our office.   Once we have confirmed authorization from your insurance company, we will call you to set up a date and time for your test. Based on how quickly your insurance processes prior authorizations requests, please allow up to 4 weeks to be contacted for scheduling your Cardiac CT appointment. Be advised that routine Cardiac CT appointments could be scheduled as many as 8 weeks after your provider has ordered it.  For non-scheduling related questions, please contact the cardiac imaging nurse navigator should you have any questions/concerns: Marchia Bond, Cardiac Imaging Nurse Navigator Burley Saver, Interim Cardiac Imaging Nurse [place] [facility] Direct Office Dial: [phone]   For scheduling needs, including cancellations and rescheduling, please call Vivien Rota at [phone],  option 3.     Follow-Up: At [facility], you and your health needs are our priority.  As part of our continuing mission to provide you with exceptional heart care, we have created designated Provider Care Teams.  These Care Teams include your primary Cardiologist (physician) and Advanced Practice Providers (APPs -  Physician Assistants and Nurse Practitioners) who all work together to provide you with the care you need, when you need it.  We recommend signing up for the patient portal called "MyChart".  Sign up information is provided on this After Visit Summary.  MyChart is used to connect with patients for Virtual Visits (Telemedicine).  Patients are able to view lab/test results, encounter notes, upcoming appointments, etc.  Non-urgent messages can be sent to your provider as well.   To learn more about what you can do with MyChart, go to [email or web address].    Your next appointment:   6 week(s)  The format for your next appointment:   In Person  Provider:   Kate Sable, MD   Other Instructions   Echocardiogram An echocardiogram is a procedure that uses painless sound waves (ultrasound) to produce an image of the heart. Images from an echocardiogram can provide important information about:  Signs of coronary artery disease (CAD).  Aneurysm detection. An aneurysm is a weak or damaged part of an artery wall that bulges out from the normal force of blood pumping through the body.  Heart size and shape. Changes in the size or shape of the heart can be associated with certain conditions, including heart failure, aneurysm, and CAD.  Heart muscle function.  Heart valve function.  Signs of a past heart attack.  Fluid buildup around the heart.  Thickening of the heart muscle.  A tumor or infectious growth around the heart valves. Tell a health care provider about:  Any allergies you have.  All medicines you are taking, including vitamins, herbs, eye drops,  creams, and over-the-counter medicines.  Any blood disorders you have.  Any surgeries you have had.  Any medical conditions you have.  Whether you are pregnant or may be pregnant. What are the risks? Generally, this is a safe procedure. However, problems may occur, including:  Allergic reaction to dye (contrast) that may be used during the procedure. What happens before the procedure? No specific preparation is needed. You may eat and drink normally. What happens during the procedure?   An IV tube may be inserted into one of your veins.  You may receive contrast through this tube. A contrast is an injection that improves the quality of the pictures from your heart.  A gel will be applied to your chest.  A wand-like tool (transducer) will be moved over your chest. The gel will help to transmit the sound waves from the transducer.  The sound waves will harmlessly bounce off of your heart to allow the heart images to be captured in real-time motion. The images will be recorded on a computer. The procedure may vary among health care providers and hospitals. What happens after the procedure?  You may return to your normal, everyday life, including diet, activities, and medicines, unless your health care provider tells you not to do that. Summary  An echocardiogram is a procedure that uses painless sound waves (ultrasound) to produce an image of the heart.  Images from an echocardiogram can provide important information about the size and shape of your heart, heart muscle function, heart valve function, and fluid buildup around your heart.  You do not need to do anything to prepare before this procedure. You may eat and drink normally.  After the echocardiogram is completed, you may return to your normal, everyday life, unless your health care provider tells you not to do that. This information is not intended to replace advice given to you by your health care provider. Make sure  you discuss any questions you have with your health care provider. Document Revised: 06/17/2018 Document Reviewed: 03/29/2016 Elsevier Patient Education  [employer].   Cardiac CT Angiogram A cardiac CT angiogram is a procedure to look at the heart and the area around the heart. It may be done to help find the cause of chest pains or other symptoms of heart disease. During this procedure, a substance called contrast dye is injected into the blood vessels in the area to be checked. A large X-ray machine, called a CT scanner, then takes detailed pictures of the heart and the surrounding area. The procedure is also sometimes called a coronary CT angiogram, coronary artery scanning, or CTA. A cardiac CT angiogram allows the health care provider to see how well blood is flowing to and from the heart. The health care provider will be able to see if there are any problems, such as:  Blockage or narrowing of the coronary arteries in the heart.  Fluid around the heart.  Signs of weakness or disease in the muscles, valves, and tissues of the heart. Tell a health care provider about:  Any allergies you have. This is especially important if you have had a previous allergic reaction to contrast dye.  All medicines you are taking, including vitamins, herbs, eye drops, creams, and over-the-counter medicines.  Any blood disorders  you have.  Any surgeries you have had.  Any medical conditions you have.  Whether you are pregnant or may be pregnant.  Any anxiety disorders, chronic pain, or other conditions you have that may increase your stress or prevent you from lying still. What are the risks? Generally, this is a safe procedure. However, problems may occur, including:  Bleeding.  Infection.  Allergic reactions to medicines or dyes.  Damage to other structures or organs.  Kidney damage from the contrast dye that is used.  Increased risk of cancer from radiation exposure. This risk is  low. Talk with your health care provider about: ? The risks and benefits of testing. ? How you can receive the lowest dose of radiation. What happens before the procedure?  Wear comfortable clothing and remove any jewelry, glasses, dentures, and hearing aids.  Follow instructions from your health care provider about eating and drinking. This may include: ? For 12 hours before the procedure -- avoid caffeine. This includes tea, coffee, soda, energy drinks, and diet pills. Drink plenty of water or other fluids that do not have caffeine in them. Being well hydrated can prevent complications. ? For 4-6 hours before the procedure -- stop eating and drinking. The contrast dye can cause nausea, but this is less likely if your stomach is empty.  Ask your health care provider about changing or stopping your regular medicines. This is especially important if you are taking diabetes medicines, blood thinners, or medicines to treat problems with erections (erectile dysfunction). What happens during the procedure?   Hair on your chest may need to be removed so that small sticky patches called electrodes can be placed on your chest. These will transmit information that helps to monitor your heart during the procedure.  An IV will be inserted into one of your veins.  You might be given a medicine to control your heart rate during the procedure. This will help to ensure that good images are obtained.  You will be asked to lie on an exam table. This table will slide in and out of the CT machine during the procedure.  Contrast dye will be injected into the IV. You might feel warm, or you may get a metallic taste in your mouth.  You will be given a medicine called nitroglycerin. This will relax or dilate the arteries in your heart.  The table that you are lying on will move into the CT machine tunnel for the scan.  The person running the machine will give you instructions while the scans are being done.  You may be asked to: ? Keep your arms above your head. ? Hold your breath. ? Stay very still, even if the table is moving.  When the scanning is complete, you will be moved out of the machine.  The IV will be removed. The procedure may vary among health care providers and hospitals. What can I expect after the procedure? After your procedure, it is common to have:  A metallic taste in your mouth from the contrast dye.  A feeling of warmth.  A headache from the nitroglycerin. Follow these instructions at home:  Take over-the-counter and prescription medicines only as told by your health care provider.  If you are told, drink enough fluid to keep your urine pale yellow. This will help to flush the contrast dye out of your body.  Most people can return to their normal activities right after the procedure. Ask your health care provider what activities are safe  for you.  It is up to you to get the results of your procedure. Ask your health care provider, or the department that is doing the procedure, when your results will be ready.  Keep all follow-up visits as told by your health care provider. This is important. Contact a health care provider if:  You have any symptoms of allergy to the contrast dye. These include: ? Shortness of breath. ? Rash or hives. ? A racing heartbeat. Summary  A cardiac CT angiogram is a procedure to look at the heart and the area around the heart. It may be done to help find the cause of chest pains or other symptoms of heart disease.  During this procedure, a large X-ray machine, called a CT scanner, takes detailed pictures of the heart and the surrounding area after a contrast dye has been injected into blood vessels in the area.  Ask your health care provider about changing or stopping your regular medicines before the procedure. This is especially important if you are taking diabetes medicines, blood thinners, or medicines to treat erectile  dysfunction.  If you are told, drink enough fluid to keep your urine pale yellow. This will help to flush the contrast dye out of your body. This information is not intended to replace advice given to you by your health care provider. Make sure you discuss any questions you have with your health care provider. Document Revised: 10/20/2018 Document Reviewed: 10/20/2018 Elsevier Patient Education  2020 [place], Kate Sable, MD  12/23/2019 12:38 PM    [place] [facility]

## 2019-12-23 NOTE — Patient Instructions (Addendum)
Medication Instructions:  - Your physician recommends that you continue on your current medications as directed. Please refer to the Current Medication list given to you today.  *If you need a refill on your cardiac medications before your next appointment, please call your pharmacy*   Lab Work: - none ordered  If you have labs (blood work) drawn today and your tests are completely normal, you will receive your results only by: Marland Kitchen MyChart Message (if you have MyChart) OR . A paper copy in the mail If you have any lab test that is abnormal or we need to change your treatment, we will call you to review the results.   Testing/Procedures:  1) Echocardiogram  - Your physician has requested that you have an echocardiogram. Echocardiography is a painless test that uses sound waves to create images of your heart. It provides your doctor with information about the size and shape of your heart and how well your heart's chambers and valves are working. This procedure takes approximately one hour. There are no restrictions for this procedure. There is a possibility that an IV may need to be started during your test to inject an image enhancing agent. This is done to obtain more optimal pictures of your heart. Therefore we ask that you do at least drink some water prior to coming in to hydrate your veins.    2) Cardiac CTA - Your physician has requested that you have cardiac CT. Cardiac computed tomography (CT) is a painless test that uses an x-ray machine to take clear, detailed pictures of your heart.   Your cardiac CT will be scheduled at one of the below locations:   [facility] [address] [place], [place] [address] [phone]  [place] [address] [place], [place] [address] [phone]  If scheduled at [facility], please arrive at the [facility] main entrance of [facility] 30 minutes prior to  test start time. Proceed to the [facility] Radiology Department (first floor) to check-in and test prep.  If scheduled at [facility], please arrive 15 mins early for check-in and test prep.  Please follow these instructions carefully (unless otherwise directed):  On the Night Before the Test: . Be sure to Drink plenty of water. . Do not consume any caffeinated/decaffeinated beverages or chocolate 12 hours prior to your test. . Do not take any antihistamines 12 hours prior to your test.   On the Day of the Test: . Drink plenty of water. Do not drink any water within one hour of the test. . Do not eat any food 4 hours prior to the test. . You may take your regular medications prior to the test.  . Take metoprolol (Lopressor) 100 mg two hours prior to test. . FEMALES- please wear underwire-free bra if available       After the Test: . Drink plenty of water. . After receiving IV contrast, you may experience a mild flushed feeling. This is normal. . On occasion, you may experience a mild rash up to 24 hours after the test. This is not dangerous. If this occurs, you can take Benadryl 25 mg and increase your fluid intake. . If you experience trouble breathing, this can be serious. If it is severe call 911 IMMEDIATELY. If it is mild, please call our office.   Once we have confirmed authorization from your insurance company, we will call you to set up a  date and time for your test. Based on how quickly your insurance processes prior authorizations requests, please allow up to 4 weeks to be contacted for scheduling your Cardiac CT appointment. Be advised that routine Cardiac CT appointments could be scheduled as many as 8 weeks after your provider has ordered it.  For non-scheduling related questions, please contact the cardiac imaging nurse navigator should you have any questions/concerns: Marchia Bond, Cardiac Imaging Nurse Navigator Burley Saver, Interim Cardiac  Imaging Nurse [facility] Direct Office Dial: [phone]   For scheduling needs, including cancellations and rescheduling, please call Vivien Rota at [phone], option 3.     Follow-Up: At [facility], you and your health needs are our priority.  As part of our continuing mission to provide you with exceptional heart care, we have created designated Provider Care Teams.  These Care Teams include your primary Cardiologist (physician) and Advanced Practice Providers (APPs -  Physician Assistants and Nurse Practitioners) who all work together to provide you with the care you need, when you need it.  We recommend signing up for the patient portal called "MyChart".  Sign up information is provided on this After Visit Summary.  MyChart is used to connect with patients for Virtual Visits (Telemedicine).  Patients are able to view lab/test results, encounter notes, upcoming appointments, etc.  Non-urgent messages can be sent to your provider as well.   To learn more about what you can do with MyChart, go to [email or web address].    Your next appointment:   6 week(s)  The format for your next appointment:   In Person  Provider:   Kate Sable, MD   Other Instructions   Echocardiogram An echocardiogram is a procedure that uses painless sound waves (ultrasound) to produce an image of the heart. Images from an echocardiogram can provide important information about:  Signs of coronary artery disease (CAD).  Aneurysm detection. An aneurysm is a weak or damaged part of an artery wall that bulges out from the normal force of blood pumping through the body.  Heart size and shape. Changes in the size or shape of the heart can be associated with certain conditions, including heart failure, aneurysm, and CAD.  Heart muscle function.  Heart valve function.  Signs of a past heart attack.  Fluid buildup around the heart.  Thickening of the heart  muscle.  A tumor or infectious growth around the heart valves. Tell a health care provider about:  Any allergies you have.  All medicines you are taking, including vitamins, herbs, eye drops, creams, and over-the-counter medicines.  Any blood disorders you have.  Any surgeries you have had.  Any medical conditions you have.  Whether you are pregnant or may be pregnant. What are the risks? Generally, this is a safe procedure. However, problems may occur, including:  Allergic reaction to dye (contrast) that may be used during the procedure. What happens before the procedure? No specific preparation is needed. You may eat and drink normally. What happens during the procedure?   An IV tube may be inserted into one of your veins.  You may receive contrast through this tube. A contrast is an injection that improves the quality of the pictures from your heart.  A gel will be applied to your chest.  A wand-like tool (transducer) will be moved over your chest. The gel will help to transmit the sound waves from the transducer.  The sound waves will harmlessly bounce off of your heart to allow  the heart images to be captured in real-time motion. The images will be recorded on a computer. The procedure may vary among health care providers and hospitals. What happens after the procedure?  You may return to your normal, everyday life, including diet, activities, and medicines, unless your health care provider tells you not to do that. Summary  An echocardiogram is a procedure that uses painless sound waves (ultrasound) to produce an image of the heart.  Images from an echocardiogram can provide important information about the size and shape of your heart, heart muscle function, heart valve function, and fluid buildup around your heart.  You do not need to do anything to prepare before this procedure. You may eat and drink normally.  After the echocardiogram is completed, you may  return to your normal, everyday life, unless your health care provider tells you not to do that. This information is not intended to replace advice given to you by your health care provider. Make sure you discuss any questions you have with your health care provider. Document Revised: 06/17/2018 Document Reviewed: 03/29/2016 Elsevier Patient Education  [place].   Cardiac CT Angiogram A cardiac CT angiogram is a procedure to look at the heart and the area around the heart. It may be done to help find the cause of chest pains or other symptoms of heart disease. During this procedure, a substance called contrast dye is injected into the blood vessels in the area to be checked. A large X-ray machine, called a CT scanner, then takes detailed pictures of the heart and the surrounding area. The procedure is also sometimes called a coronary CT angiogram, coronary artery scanning, or CTA. A cardiac CT angiogram allows the health care provider to see how well blood is flowing to and from the heart. The health care provider will be able to see if there are any problems, such as:  Blockage or narrowing of the coronary arteries in the heart.  Fluid around the heart.  Signs of weakness or disease in the muscles, valves, and tissues of the heart. Tell a health care provider about:  Any allergies you have. This is especially important if you have had a previous allergic reaction to contrast dye.  All medicines you are taking, including vitamins, herbs, eye drops, creams, and over-the-counter medicines.  Any blood disorders you have.  Any surgeries you have had.  Any medical conditions you have.  Whether you are pregnant or may be pregnant.  Any anxiety disorders, chronic pain, or other conditions you have that may increase your stress or prevent you from lying still. What are the risks? Generally, this is a safe procedure. However, problems may occur,  including:  Bleeding.  Infection.  Allergic reactions to medicines or dyes.  Damage to other structures or organs.  Kidney damage from the contrast dye that is used.  Increased risk of cancer from radiation exposure. This risk is low. Talk with your health care provider about: ? The risks and benefits of testing. ? How you can receive the lowest dose of radiation. What happens before the procedure?  Wear comfortable clothing and remove any jewelry, glasses, dentures, and hearing aids.  Follow instructions from your health care provider about eating and drinking. This may include: ? For 12 hours before the procedure -- avoid caffeine. This includes tea, coffee, soda, energy drinks, and diet pills. Drink plenty of water or other fluids that do not have caffeine in them. Being well hydrated can prevent complications. ?  For 4-6 hours before the procedure -- stop eating and drinking. The contrast dye can cause nausea, but this is less likely if your stomach is empty.  Ask your health care provider about changing or stopping your regular medicines. This is especially important if you are taking diabetes medicines, blood thinners, or medicines to treat problems with erections (erectile dysfunction). What happens during the procedure?   Hair on your chest may need to be removed so that small sticky patches called electrodes can be placed on your chest. These will transmit information that helps to monitor your heart during the procedure.  An IV will be inserted into one of your veins.  You might be given a medicine to control your heart rate during the procedure. This will help to ensure that good images are obtained.  You will be asked to lie on an exam table. This table will slide in and out of the CT machine during the procedure.  Contrast dye will be injected into the IV. You might feel warm, or you may get a metallic taste in your mouth.  You will be given a medicine called  nitroglycerin. This will relax or dilate the arteries in your heart.  The table that you are lying on will move into the CT machine tunnel for the scan.  The person running the machine will give you instructions while the scans are being done. You may be asked to: ? Keep your arms above your head. ? Hold your breath. ? Stay very still, even if the table is moving.  When the scanning is complete, you will be moved out of the machine.  The IV will be removed. The procedure may vary among health care providers and hospitals. What can I expect after the procedure? After your procedure, it is common to have:  A metallic taste in your mouth from the contrast dye.  A feeling of warmth.  A headache from the nitroglycerin. Follow these instructions at home:  Take over-the-counter and prescription medicines only as told by your health care provider.  If you are told, drink enough fluid to keep your urine pale yellow. This will help to flush the contrast dye out of your body.  Most people can return to their normal activities right after the procedure. Ask your health care provider what activities are safe for you.  It is up to you to get the results of your procedure. Ask your health care provider, or the department that is doing the procedure, when your results will be ready.  Keep all follow-up visits as told by your health care provider. This is important. Contact a health care provider if:  You have any symptoms of allergy to the contrast dye. These include: ? Shortness of breath. ? Rash or hives. ? A racing heartbeat. Summary  A cardiac CT angiogram is a procedure to look at the heart and the area around the heart. It may be done to help find the cause of chest pains or other symptoms of heart disease.  During this procedure, a large X-ray machine, called a CT scanner, takes detailed pictures of the heart and the surrounding area after a contrast dye has been injected into blood  vessels in the area.  Ask your health care provider about changing or stopping your regular medicines before the procedure. This is especially important if you are taking diabetes medicines, blood thinners, or medicines to treat erectile dysfunction.  If you are told, drink enough fluid to keep your  urine pale yellow. This will help to flush the contrast dye out of your body. This information is not intended to replace advice given to you by your health care provider. Make sure you discuss any questions you have with your health care provider. Document Revised: 10/20/2018 Document Reviewed: 10/20/2018 Elsevier Patient Education  [place].

## 2019-12-27 ENCOUNTER — Ambulatory Visit: Payer: 59 | Admitting: Nurse Practitioner

## 2019-12-28 ENCOUNTER — Other Ambulatory Visit: Payer: 59

## 2019-12-28 ENCOUNTER — Other Ambulatory Visit: Payer: Self-pay

## 2019-12-28 ENCOUNTER — Encounter (HOSPITAL_COMMUNITY): Payer: Self-pay

## 2019-12-28 ENCOUNTER — Ambulatory Visit (HOSPITAL_COMMUNITY)
Admission: RE | Admit: 2019-12-28 | Discharge: 2019-12-28 | Disposition: A | Discharge status: Home / Self Care | Payer: 59 | Source: Ambulatory Visit | Attending: Nurse Practitioner | Admitting: Nurse Practitioner

## 2019-12-28 ENCOUNTER — Ambulatory Visit (HOSPITAL_COMMUNITY): Payer: 59

## 2019-12-28 DIAGNOSIS — G4452 New daily persistent headache (NDPH): Secondary | ICD-10-CM | POA: Insufficient documentation

## 2019-12-28 DIAGNOSIS — R519 Headache, unspecified: Secondary | ICD-10-CM | POA: Diagnosis not present

## 2020-01-13 ENCOUNTER — Other Ambulatory Visit: Payer: Self-pay

## 2020-01-13 ENCOUNTER — Ambulatory Visit (INDEPENDENT_AMBULATORY_CARE_PROVIDER_SITE_OTHER): Payer: 59

## 2020-01-13 DIAGNOSIS — R0600 Dyspnea, unspecified: Secondary | ICD-10-CM

## 2020-01-13 DIAGNOSIS — R079 Chest pain, unspecified: Secondary | ICD-10-CM | POA: Diagnosis not present

## 2020-01-13 DIAGNOSIS — R0609 Other forms of dyspnea: Secondary | ICD-10-CM

## 2020-01-16 LAB — ECHOCARDIOGRAM COMPLETE
AR max vel: 3.4 cm2
AV Area VTI: 3.08 cm2
AV Area mean vel: 3.04 cm2
AV Mean grad: 3 mmHg
AV Peak grad: 5 mmHg
Ao pk vel: 1.12 m/s
Area-P 1/2: 3.39 cm2
Calc EF: 56.8 %
S' Lateral: 2.7 cm
Single Plane A2C EF: 57.2 %
Single Plane A4C EF: 56.8 %

## 2020-01-17 ENCOUNTER — Telehealth (HOSPITAL_COMMUNITY): Payer: Self-pay | Admitting: *Deleted

## 2020-01-17 ENCOUNTER — Telehealth: Payer: Self-pay

## 2020-01-17 NOTE — Telephone Encounter (Signed)
Attempted to call patient regarding upcoming cardiac CT appointment. °Left message on voicemail with name and callback number °Sara Wallace RN Navigator Cardiac Imaging °[facility] °[phone] Office °[phone] Cell ° °

## 2020-01-17 NOTE — Telephone Encounter (Signed)
The patient has been notified of the result via VM per DPR on file. A Encouraged patient to call back with any questions or concerns.

## 2020-01-18 ENCOUNTER — Other Ambulatory Visit (HOSPITAL_COMMUNITY): Payer: Self-pay | Admitting: Emergency Medicine

## 2020-01-18 ENCOUNTER — Other Ambulatory Visit: Payer: Self-pay

## 2020-01-18 ENCOUNTER — Telehealth: Payer: Self-pay | Admitting: Cardiology

## 2020-01-18 ENCOUNTER — Ambulatory Visit
Admission: RE | Admit: 2020-01-18 | Discharge: 2020-01-18 | Disposition: A | Discharge status: Home / Self Care | Payer: 59 | Source: Ambulatory Visit | Attending: Cardiology | Admitting: Cardiology

## 2020-01-18 DIAGNOSIS — R072 Precordial pain: Secondary | ICD-10-CM | POA: Insufficient documentation

## 2020-01-18 HISTORY — DX: Essential (primary) hypertension: I10

## 2020-01-18 LAB — POCT I-STAT CREATININE: Creatinine, Ser: 0.6 mg/dL (ref 0.44–1.00)

## 2020-01-18 MED ORDER — METOPROLOL TARTRATE 100 MG PO TABS
ORAL_TABLET | ORAL | 0 refills | Status: DC
Start: 2020-01-18 — End: 2020-01-25

## 2020-01-18 MED ORDER — DILTIAZEM HCL 25 MG/5ML IV SOLN
10.0000 mg | Freq: Once | INTRAVENOUS | Status: AC
  Administered 2020-01-18: 10 mg via INTRAVENOUS

## 2020-01-18 NOTE — Progress Notes (Signed)
Pt unable to be scanned at [facility] due to fast HR.   Per [facility], R/S with 144m metoprolol tartrate + 155mIvabradine 2 hr prior to scan.  Pt here and verbalizes understanding.   [place] [place] [place] [facility]s for next week 01/26/20 [facility] [phone]  [phone]

## 2020-01-18 NOTE — Telephone Encounter (Signed)
[place] [facility] is calling to state patient was unable to be scanned for her cardiac CT today due to fast HR. Office would like her to return for another scan on 11/18 after taking Corlanor. Imaging office would like for our office to give patient a sample of 15 mg of corlanor   If any questions, call Lisabeth Pick at [phone]

## 2020-01-18 NOTE — Telephone Encounter (Signed)
Medication Samples have been left at front desk for patient to pick up at her convenience.   Drug name: Corlanor       Strength: 5 mg        Qty: 1 bottle  LOT: [id number]  Exp.Date: 09/2023  Dosing instructions: Take 3 tablets (15 mg) by mouth 2 hours prior to procedure.

## 2020-01-24 ENCOUNTER — Telehealth (HOSPITAL_COMMUNITY): Payer: Self-pay | Admitting: *Deleted

## 2020-01-24 NOTE — Telephone Encounter (Signed)
Reaching out to patient to offer assistance regarding upcoming cardiac imaging study; pt verbalizes understanding of appt date/time, parking situation and where to check in, pre-test NPO status and medications ordered, and verified current allergies; name and call back number provided for further questions should they arise ° °Hobert Poplaski Tai RN Navigator Cardiac Imaging °[facility] °[phone] office °[phone] cell ° °

## 2020-01-25 ENCOUNTER — Other Ambulatory Visit: Payer: Self-pay | Admitting: Nurse Practitioner

## 2020-01-25 ENCOUNTER — Other Ambulatory Visit: Payer: Self-pay

## 2020-01-25 ENCOUNTER — Ambulatory Visit: Payer: 59 | Admitting: Nurse Practitioner

## 2020-01-25 ENCOUNTER — Encounter: Payer: Self-pay | Admitting: Nurse Practitioner

## 2020-01-25 VITALS — BP 118/72 | HR 98 | Temp 98.2°F | Resp 18 | Ht 63.0 in | Wt 205.5 lb

## 2020-01-25 DIAGNOSIS — E559 Vitamin D deficiency, unspecified: Secondary | ICD-10-CM | POA: Diagnosis not present

## 2020-01-25 DIAGNOSIS — G4452 New daily persistent headache (NDPH): Secondary | ICD-10-CM | POA: Diagnosis not present

## 2020-01-25 DIAGNOSIS — M542 Cervicalgia: Secondary | ICD-10-CM | POA: Diagnosis not present

## 2020-01-25 DIAGNOSIS — R0600 Dyspnea, unspecified: Secondary | ICD-10-CM

## 2020-01-25 DIAGNOSIS — R0609 Other forms of dyspnea: Secondary | ICD-10-CM

## 2020-01-25 MED ORDER — CYCLOBENZAPRINE HCL 5 MG PO TABS: 5.0000 mg | ORAL_TABLET | Freq: Three times a day (TID) | ORAL | 1 refills | Status: DC | PRN

## 2020-01-25 MED ORDER — CHOLECALCIFEROL 25 MCG (1000 UT) PO CAPS: 1000.0000 [IU] | ORAL_CAPSULE | Freq: Every day | ORAL | 2 refills | Status: DC

## 2020-01-25 NOTE — Patient Instructions (Addendum)
For persistent posterior headache  please see neurology as planned on December 14 with Dr. Jaynee Eagles.   Stop the Mag OX as it is not helping and you are getting leg cramps from it.    You have tenderness right posterior cervical neck just at the base of the spine.  When you turn your neck that hurts and you have a tingling - zapping sensations. C-spine x-rays showed straightening of the cervical spine to suggest muscle spasm.  We will add Flexeril 5 mg 1 tablet up to 3 times daily for 7 days and may refill.  Monitor for any drowsiness and cut back to bedtime use only if needed. You may also take a 10 mg dose of Flexeril at bedtime only if it helps. Tylenol 1 gm at bedtime instead of tramadol if needed. Avoid NSAIDs as bothered your stomach.   Also consider warm moist heat, neck massage.  Referral to Ortho when you are ready.   Vit D 3 1000 IU daily-for 3 months  I ordered as a prescription  Follow up with Cardiology plans for shortness of breath with exertion. Your pulse and BP are good today.  Please follow up in 3 months for recheck on Vit D level, headache and shortness of breath.  Contact a health care provider if you have:  Headaches that are getting worse and happening more often.  Headaches with any of the following: ? Fever. ? Numbness. ? Weakness. ? Dizziness. ? Nausea or vomiting. Get help right away if:  You have a very sudden and severe headache.       Cervicogenic Headache  A cervicogenic headache is a headache caused by a condition that affects the bones and tissues in your neck (cervical spine). In a cervicogenic headache, the pain moves from your neck to your head. Most cervicogenic headaches start in the upper part of the neck with the first three cervical bones (cervical vertebrae). A cervicogenic headache is diagnosed when a cause can be found in the cervical spine and other causes of headaches can be ruled out. What are the causes? The most common cause of this  condition is a traumatic injury to the cervical spine, such as whiplash. Other causes include:  Arthritis.  Broken bone (fracture).  Infection.  Tumor. What are the signs or symptoms? The most common symptoms are neck and head pain. The pain is often located on one side. In some cases, there may be head pain without neck pain. Pain may be felt in the neck, back or side of the head, face, or behind the eyes. Other symptoms include:  Limited movement in the neck.  Arm or shoulder pain. How is this diagnosed? This condition may be diagnosed based on:  Your symptoms.  A physical exam.  An injection that blocks nerve signals (nerve block).  Imaging tests, such as: ? X-rays. ? CT scan. ? MRI. How is this treated? Treatment for this condition may depend on the underlying condition. Treatment may include:  Medicines, such as: ? NSAIDs. ? Muscle relaxants.  Physical therapy.  Massage therapy.  Complementary therapies, such as: ? Biofeedback. ? Meditation. ? Acupuncture.  Nerve block injections.  Botulinum toxin injections. Your treatment plan may involve working with a pain management team that includes your primary health care provider, a pain management specialist, a neurologist, and a physical therapist. Follow these instructions at home:  Take over-the-counter and prescription medicines only as told by your health care provider.  Do exercises at home as told  by your physical therapist.  Return to your normal activities as told by your health care provider. Ask your health care provider what activities are safe for you. Avoid activities that trigger your headaches.  Maintain good neck support and posture at home and at work.  Keep all follow-up visits as told by your health care provider. This is important. Contact a health care provider if you have:  Headaches that are getting worse and happening more often.  Headaches with any of the  following: ? Fever. ? Numbness. ? Weakness. ? Dizziness. ? Nausea or vomiting. Get help right away if:  You have a very sudden and severe headache. Summary  A cervicogenic headache is a headache caused by a condition that affects the bones and tissues in your cervical spine.  Your health care provider may diagnose this condition with a physical exam, a nerve block, and imaging tests.  Treatment may include medicine to reduce pain and inflammation, physical therapy, and nerve block injections.  Complementary therapies, such as acupuncture and meditation, may be added to other treatments.  Your treatment plan may involve working with a pain management team that includes your primary health care provider, a pain management specialist, a neurologist, and a physical therapist. This information is not intended to replace advice given to you by your health care provider. Make sure you discuss any questions you have with your health care provider. Document Revised: 06/16/2018 Document Reviewed: 03/06/2017 Elsevier Patient Education  [place].

## 2020-01-25 NOTE — Progress Notes (Signed)
Established Patient Office Visit  Subjective:  Patient ID: Paige Hansen, female    DOB: August 09, 1980  Age: 39 y.o. MRN: [id number]  CC:  Chief Complaint  Patient presents with  . Follow-up    HPI Paige Hansen is a 39 yo who established care is Sept. She reported history of daily posterior  HA that started 2 hours following her first [place] on 11/08/2019. She had syncopeat 4mn after the injection.She had her second Covid vaccine  on 12/07/19 and it  did not affect the HA, cause syncope, but she did have a fever Tmax 102.7 /chills for 24 hours after this vaccine.  A CT of head without contrast on 9/22/201 was normal. C spine 4+ view X-ray showed straightening of normal cervical lordosis maybe r/t postioning or muscle spasm and negative otherwise. MRI WO contrast and MRA WO contrast were both negative on 12/28/2019. She sees Neurologist Dr. AJaynee Eagleson 02/06/2020. Vit D is low at 16. B12 is WNL 257.   Last visit, she was given metoprolol succinate  XL 25 mg daily and a short course of tramadol for HA pain management. She had been taking ibuprofen without help and discontinued. She thinks Mag oxide causes leg cramps and has not helped her HA. She reports she took the tramadol at night and that helped decrease the pounding posterior HA. Rice heat pack helps a little.  She has new sensation which is a few seconds of electric shock -"zaps" on right posterior head side that has occurred onset 10/20/20021. One time, it had 3 zaps in 60 min- all fleeting. No trigger or position changes. No right arm pain, tingling. Her posterior  head feels heavy and a constant low pressure sensation. Positive a few dizzy spells when she turns her head side to side. No inner ear fullness or ear pain. She has not had neck massage.   Past Medical History:  Diagnosis Date  . Allergy   . Anxiety   . Colitis   . Depression    Post Partum   . Frequent headaches   . GERD (gastroesophageal reflux disease)    . Hemorrhoids   . Hypertension     Past Surgical History:  Procedure Laterality Date  . COLONOSCOPY    . none    . WISDOM TOOTH EXTRACTION     1998    Family History  Problem Relation Age of Onset  . Breast cancer Maternal Grandmother   . Arthritis Maternal Grandmother   . Cancer Maternal Grandmother   . Hyperlipidemia Maternal Grandmother   . Hypertension Maternal Grandmother   . Kidney disease Maternal Grandmother   . Diabetes Mother   . Hyperlipidemia Mother   . Hypertension Mother   . Alcohol abuse Father   . Alcohol abuse Brother   . Asthma Brother   . Asthma Daughter   . Intellectual disability Daughter   . Arthritis Maternal Grandfather   . Cancer Maternal Grandfather   . Diabetes Maternal Grandfather   . Hearing loss Maternal Grandfather   . Heart disease Maternal Grandfather   . Hyperlipidemia Maternal Grandfather   . Hypertension Maternal Grandfather   . Kidney disease Maternal Grandfather   . Heart attack Maternal Grandfather   . Diabetes Paternal Grandmother   . Heart disease Paternal Grandmother   . Hypertension Paternal Grandmother   . Colon cancer Neg Hx   . Esophageal cancer Neg Hx   . Rectal cancer Neg Hx   . Stomach cancer Neg Hx   .  Colon polyps Neg Hx     Social History   Socioeconomic History  . Marital status: Married    Spouse name: Not on file  . Number of children: 2  . Years of education: Not on file  . Highest education level: Not on file  Occupational History  . Occupation: [employer]    Employer: [employer]  Tobacco Use  . Smoking status: Never Smoker  . Smokeless tobacco: Never Used  Vaping Use  . Vaping Use: Never used  Substance and Sexual Activity  . Alcohol use: No  . Drug use: No  . Sexual activity: Yes    Birth control/protection: Implant    Comment: ensure impant  Other Topics Concern  . Not on file  Social History Narrative  . Not on file   Social Determinants of Health   Financial Resource Strain:   .  Difficulty of Paying Living Expenses: Not on file  Food Insecurity:   . Worried About Charity fundraiser in the Last Year: Not on file  . Ran Out of Food in the Last Year: Not on file  Transportation Needs:   . Lack of Transportation (Medical): Not on file  . Lack of Transportation (Non-Medical): Not on file  Physical Activity:   . Days of Exercise per Week: Not on file  . Minutes of Exercise per Session: Not on file  Stress:   . Feeling of Stress : Not on file  Social Connections:   . Frequency of Communication with Friends and Family: Not on file  . Frequency of Social Gatherings with Friends and Family: Not on file  . Attends Religious Services: Not on file  . Active Member of Clubs or Organizations: Not on file  . Attends Archivist Meetings: Not on file  . Marital Status: Not on file  Intimate Partner Violence:   . Fear of Current or Ex-Partner: Not on file  . Emotionally Abused: Not on file  . Physically Abused: Not on file  . Sexually Abused: Not on file    Outpatient Medications Prior to Visit  Medication Sig Dispense Refill  . calcium carbonate (TUMS - DOSED IN MG ELEMENTAL CALCIUM) 500 MG chewable tablet Chew 1 tablet by mouth daily. (Patient not taking: Reported on 01/26/2020)    . metoprolol succinate (TOPROL-XL) 25 MG 24 hr tablet Take 1 tablet (25 mg total) by mouth daily. 30 tablet 1  . metoprolol tartrate (LOPRESSOR) 100 MG tablet Take 1 tablet (100 mg) by mouth 2 hours prior to your Cardiac CTA 1 tablet 0  . Vitamin D, Ergocalciferol, (DRISDOL) 1.25 MG (50000 UNIT) CAPS capsule Take 1 capsule (50,000 Units total) by mouth every 7 (seven) days. 8 capsule 0   No facility-administered medications prior to visit.    Allergies  Allergen Reactions  . Latex     Review of Systems  Constitutional: Negative for chills and fever.  HENT: Negative for congestion, dental problem, ear pain, postnasal drip, sinus pressure, sinus pain, sneezing and sore throat.     Eyes: Positive for photophobia. Negative for visual disturbance.  Respiratory: Positive for shortness of breath. Negative for cough and chest tightness.   Cardiovascular: Positive for palpitations. Negative for chest pain and leg swelling.  Gastrointestinal: Negative for abdominal pain and blood in stool.  Endocrine: Negative.   Genitourinary: Negative.   Musculoskeletal: Positive for neck pain. Negative for back pain, gait problem and joint swelling.  Skin: Negative for rash.  Allergic/Immunologic: Negative.   Neurological: Positive  for dizziness and headaches. Negative for tremors, seizures, syncope, facial asymmetry, speech difficulty, weakness, light-headedness and numbness.  Hematological: Negative for adenopathy. Does not bruise/bleed easily.  Psychiatric/Behavioral:       No concerns about depression- Dx late post partum anxiety in distant past      Objective:    Physical Exam Vitals reviewed.  HENT:     Head: Normocephalic and atraumatic.  Eyes:     Pupils: Pupils are equal, round, and reactive to light.  Cardiovascular:     Rate and Rhythm: Normal rate.     Pulses: Normal pulses.  Pulmonary:     Effort: Pulmonary effort is normal.  Musculoskeletal:        General: Tenderness present. Normal range of motion.     Cervical back: Normal range of motion and neck supple. Tenderness present. No swelling, spasms, torticollis, bony tenderness or crepitus.     Comments: Tenderness right posterior cervical neck just at the base of the spine. Normal ROM.   Skin:    General: Skin is warm and dry.  Neurological:     General: No focal deficit present.     Mental Status: She is alert and oriented to person, place, and time.  Psychiatric:        Mood and Affect: Mood normal.     BP 118/72 (BP Location: Left Arm, Patient Position: Sitting, Cuff Size: Large)   Pulse 98   Temp 98.2 F (36.8 C) (Oral)   Resp 18   Ht 5' 3"  (1.6 m)   Wt 205 lb 8 oz (93.2 kg)   SpO2 98%   BMI  36.40 kg/m  Wt Readings from Last 3 Encounters:  01/25/20 205 lb 8 oz (93.2 kg)  12/23/19 202 lb (91.6 kg)  12/09/19 206 lb (93.4 kg)   Pulse Readings from Last 3 Encounters:  01/26/20 60  01/25/20 98  01/18/20 80    BP Readings from Last 3 Encounters:  01/26/20 (!) 104/92  01/25/20 118/72  01/18/20 104/68    Lab Results  Component Value Date   CHOL 134 11/30/2019   HDL 39.60 11/30/2019   LDLCALC 77 11/30/2019   TRIG 88.0 11/30/2019   CHOLHDL 3 11/30/2019      Health Maintenance Due  Topic Date Due  . Hepatitis C Screening  Never done  . HIV Screening  Never done  . TETANUS/TDAP  Never done  . PAP SMEAR-Modifier  Never done  . COLONOSCOPY  07/15/2017    There are no preventive care reminders to display for this patient.  Lab Results  Component Value Date   TSH 1.73 11/30/2019   Lab Results  Component Value Date   WBC 8.6 11/30/2019   HGB 14.0 11/30/2019   HCT 40.5 11/30/2019   MCV 88.7 11/30/2019   PLT 236.0 11/30/2019   Lab Results  Component Value Date   NA 138 11/30/2019   K 3.7 11/30/2019   CO2 29 11/30/2019   GLUCOSE 93 11/30/2019   BUN 9 11/30/2019   CREATININE 0.60 01/18/2020   BILITOT 0.7 11/30/2019   ALKPHOS 70 11/30/2019   AST 11 11/30/2019   ALT 13 11/30/2019   PROT 6.5 11/30/2019   ALBUMIN 4.4 11/30/2019   CALCIUM 9.1 11/30/2019   GFR 74.50 11/30/2019   Lab Results  Component Value Date   CHOL 134 11/30/2019   Lab Results  Component Value Date   HDL 39.60 11/30/2019   Lab Results  Component Value Date   LDLCALC 77 11/30/2019  Lab Results  Component Value Date   TRIG 88.0 11/30/2019   Lab Results  Component Value Date   CHOLHDL 3 11/30/2019   Lab Results  Component Value Date   HGBA1C 5.3 11/30/2019      Assessment & Plan:   Problem List Items Addressed This Visit      Other   New daily persistent headache   Relevant Medications   cyclobenzaprine (FLEXERIL) 5 MG tablet   DOE (dyspnea on exertion)    Vitamin D deficiency - Primary   Relevant Orders   VITAMIN D 25 Hydroxy (Vit-D Deficiency, Fractures)   Cervical spine pain      Meds ordered this encounter  Medications  . cyclobenzaprine (FLEXERIL) 5 MG tablet    Sig: Take 1 tablet (5 mg total) by mouth 3 (three) times daily as needed for up to 14 days for muscle spasms.    Dispense:  21 tablet    Refill:  1    Order Specific Question:   Supervising Provider    Answer:   Einar Pheasant [id number]  . Cholecalciferol 25 MCG (1000 UT) capsule    Sig: Take 1 capsule (1,000 Units total) by mouth daily.    Dispense:  30 capsule    Refill:  2    Order Specific Question:   Supervising Provider    Answer:   Einar Pheasant [[id number]]   Pt advised:  For persistent posterior headache  please see neurology as planned on December 14 with Dr. Jaynee Eagles.   Stop the Mag OX as it is not helping and you are getting leg cramps from it.    You have tenderness right posterior cervical neck just at the base of the spine.  When you turn your neck that hurts and you have a tingling - zapping sensations. C-spine x-rays showed straightening of the cervical spine to suggest muscle spasm.  We will add Flexeril 5 mg 1 tablet up to 3 times daily for 7 days and may refill.  Monitor for any drowsiness and cut back to bedtime use only if needed. You may also take a 10 mg dose of Flexeril at bedtime only if it helps. Tylenol 1 gm at bedtime instead of tramadol if needed. Avoid NSAIDs as bothered your stomach.   Also consider warm moist heat, neck massage.  Referral to Ortho when you are ready.   Vit D 3 1000 IU daily-for 3 months  I ordered as a prescription  Follow up with Cardiology plans for shortness of breath with exertion. Your pulse and BP are good today.  Please follow up in 3 months for recheck on Vit D level, headache and shortness of breath.   Follow-up: No follow-ups on file.  This visit occurred during the SARS-CoV-2 public health emergency.  Safety  protocols were in place, including screening questions prior to the visit, additional usage of staff PPE, and extensive cleaning of exam room while observing appropriate contact time as indicated for disinfecting solutions.    Denice Paradise, NP

## 2020-01-26 ENCOUNTER — Ambulatory Visit
Admission: RE | Admit: 2020-01-26 | Discharge: 2020-01-26 | Disposition: A | Discharge status: Home / Self Care | Payer: 59 | Source: Ambulatory Visit | Attending: Cardiology | Admitting: Cardiology

## 2020-01-26 DIAGNOSIS — R072 Precordial pain: Secondary | ICD-10-CM | POA: Insufficient documentation

## 2020-01-26 MED ORDER — DILTIAZEM HCL 25 MG/5ML IV SOLN
10.0000 mg | Freq: Once | INTRAVENOUS | Status: AC
  Administered 2020-01-26: 10 mg via INTRAVENOUS

## 2020-01-26 MED ORDER — METOPROLOL TARTRATE 5 MG/5ML IV SOLN
10.0000 mg | Freq: Once | INTRAVENOUS | Status: AC
  Administered 2020-01-26: 10 mg via INTRAVENOUS

## 2020-01-26 MED ORDER — IOHEXOL 350 MG/ML SOLN
90.0000 mL | Freq: Once | INTRAVENOUS | Status: AC | PRN
  Administered 2020-01-26: 90 mL via INTRAVENOUS

## 2020-01-26 MED ORDER — NITROGLYCERIN 0.4 MG SL SUBL
0.8000 mg | SUBLINGUAL_TABLET | Freq: Once | SUBLINGUAL | Status: AC
  Administered 2020-01-26: 0.8 mg via SUBLINGUAL

## 2020-01-26 NOTE — Progress Notes (Signed)
Patient tolerated procedure well. She did feel light headed after Cardizem but recovered quickly.  Ambulate w/o difficulty. Sitting in chair drinking water provided. Encouraged to drink extra water today and reasoning explained. Verbalized understanding. All questions answered. ABC intact. No further needs. Discharge from procedure area w/o issues.

## 2020-01-27 ENCOUNTER — Encounter: Payer: Self-pay | Admitting: Nurse Practitioner

## 2020-02-06 ENCOUNTER — Ambulatory Visit: Payer: 59 | Admitting: Cardiology

## 2020-02-07 ENCOUNTER — Ambulatory Visit: Payer: 59 | Admitting: Nurse Practitioner
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
## 2020-02-21 ENCOUNTER — Encounter: Payer: Self-pay | Admitting: Neurology

## 2020-02-21 ENCOUNTER — Ambulatory Visit: Payer: 59 | Admitting: Neurology

## 2020-02-21 ENCOUNTER — Other Ambulatory Visit: Payer: Self-pay | Admitting: Neurology

## 2020-02-21 VITALS — BP 119/82 | HR 84 | Ht 62.0 in | Wt 206.0 lb

## 2020-02-21 DIAGNOSIS — M5481 Occipital neuralgia: Secondary | ICD-10-CM

## 2020-02-21 DIAGNOSIS — M542 Cervicalgia: Secondary | ICD-10-CM | POA: Diagnosis not present

## 2020-02-21 DIAGNOSIS — E538 Deficiency of other specified B group vitamins: Secondary | ICD-10-CM

## 2020-02-21 MED ORDER — METHYLPREDNISOLONE ACETATE 80 MG/ML IJ SUSP
80.0000 mg | Freq: Once | INTRAMUSCULAR | Status: AC
  Administered 2020-02-21: 10:00:00 80 mg via INTRAMUSCULAR

## 2020-02-21 MED ORDER — GABAPENTIN 100 MG PO CAPS: 100.0000 mg | ORAL_CAPSULE | Freq: Three times a day (TID) | ORAL | 2 refills | Status: DC

## 2020-02-21 NOTE — Progress Notes (Signed)
Nerve block w/ steroid: Pt signed consent  0.5% Bupivocaine 1 mL LOT: [id number] EXP: 05/08/2021 NDC: 3567-0141-03  2% Lidocaine 1 mL LOT: [id number] EXP: 05/2021 NDC: 75797-282-06  Depomedrol 80 mg/ 1 mL See MAR

## 2020-02-21 NOTE — Patient Instructions (Addendum)
- Physical Therapy for cervio-occipital headaches, cervical myofascial pain/tight cervical muscles for manual therapy, stretching, sytrengthening, heating and especially dry needling.  - Continue Flexeril. Also Gabapentin as needed  - If nerve block helps can consider radiofrequency ablation(RFA) of the occipital nerve - can send [facility] for this. Can also consider MRI cervical spine and medial branch blocks c2/c3. This can be considered next.     Occipital Neuralgia  Occipital neuralgia is a type of headache that causes brief episodes of very bad pain in the back of your head. Pain from occipital neuralgia may spread (radiate) to other parts of your head. These headaches may be caused by irritation of the nerves that leave your spinal cord high up in your neck, just below the base of your skull (occipital nerves). Your occipital nerves transmit sensations from the back of your head, the top of your head, and the areas behind your ears. What are the causes? This condition can occur without any known cause (primary headache syndrome). In other cases, this condition is caused by pressure on or irritation of one of the two occipital nerves. Pressure and irritation may be due to:  Muscle spasm in the neck.  Neck injury.  Wear and tear of the vertebrae in the neck (osteoarthritis).  Disease of the disks that separate the vertebrae.  Swollen blood vessels that put pressure on the occipital nerves.  Infections.  Tumors.  Diabetes. What are the signs or symptoms? This condition causes brief burning, stabbing, electric, shocking, or shooting pain which can radiate to the top of the head. It can happen on one side or both sides of the head. It can also cause:  Pain behind the eye.  Shooting pain up the back of your head  Pain triggered by neck movement or hair brushing.  Scalp tenderness.  Aching in the back of the head between episodes of very bad pain.  Pain gets worse with  exposure to bright lights. How is this diagnosed? There is no test that diagnoses this condition. Your health care provider may diagnose this condition based on a physical exam and your symptoms. Other tests may be done, such as:  Imaging studies of the brain and neck (cervical spine), such as an MRI or CT scan. These look for causes of pinched nerves.  Applying pressure to the nerves in the neck to try to re-create the pain.  Injection of numbing medicine into the occipital nerve areas to see if pain goes away (diagnostic nerve block). How is this treated? Treatment for this condition may begin with simple measures, such as:  Rest.  Massage.  Applying heat or cold on the area.  Over-the-counter pain relievers. If these measures do not work, you may need other treatments, including:  Medicines, such as: ? Prescription-strength anti-inflammatory medicines. ? Muscle relaxants. ? Anti-seizure medicines, which can relieve pain. ? Antidepressants, which can relieve pain. ? Injected medicines, such as medicines that numb the area (local anesthetic) and steroids.  Pulsed radiofrequency ablation. This is when wires are implanted to deliver electrical impulses that block pain signals from the occipital nerve.  Surgery to relieve nerve pressure.  Physical therapy. Follow these instructions at home: Pain management      Avoid any activities that cause pain.  Rest when you have an attack of pain.  Try gentle massage to relieve pain.  Try a different pillow or sleeping position.  If directed, apply heat to the affected area as told by your health care provider.  Use the heat source that your health care provider recommends, such as a moist heat pack or a heating pad. ? Place a towel between your skin and the heat source. ? Leave the heat on for 20-30 minutes. ? Remove the heat if your skin turns bright red. This is especially important if you are unable to feel pain, heat, or  cold. You may have a greater risk of getting burned.  If directed, apply ice to the back of the head and neck area as told by your health care provider. ? Put ice in a plastic bag. ? Place a towel between your skin and the bag. ? Leave the ice on for 20 minutes, 2-3 times per day. General instructions  Take over-the-counter and prescription medicines only as told by your health care provider.  Avoid things that make your symptoms worse, such as bright lights.  Try to stay active. Get regular exercise that does not cause pain. Ask your health care provider to suggest safe exercises for you.  Work with a physical therapist to learn stretching exercises you can do at home.  Practice good posture.  Keep all follow-up visits as told by your health care provider. This is important. Contact a health care provider if:  Your medicine is not working.  You have new or worsening symptoms. Get help right away if:  You have very bad head pain that does not go away.  You have a sudden change in vision, balance, or speech. Summary  Occipital neuralgia is a type of headache that causes brief episodes of very bad pain in the back of your head.  Pain from occipital neuralgia may spread (radiate) to other parts of your head.  Treatment for this condition includes rest, massage, and medicines. This information is not intended to replace advice given to you by your health care provider. Make sure you discuss any questions you have with your health care provider. Document Revised: 02/10/2017 Document Reviewed: 05/01/2016 Elsevier Patient Education  [place].  Gabapentin capsules or tablets What is this medicine? GABAPENTIN (GA ba pen tin) is used to control seizures in certain types of epilepsy. It is also used to treat certain types of nerve pain. This medicine may be used for other purposes; ask your health care provider or pharmacist if you have questions. COMMON BRAND NAME(S):  Active-PAC with Gabapentin, Gabarone, Neurontin What should I tell my health care provider before I take this medicine? They need to know if you have any of these conditions:  history of drug abuse or alcohol abuse problem  kidney disease  lung or breathing disease  suicidal thoughts, plans, or attempt; a previous suicide attempt by you or a family member  an unusual or allergic reaction to gabapentin, other medicines, foods, dyes, or preservatives  pregnant or trying to get pregnant  breast-feeding How should I use this medicine? Take this medicine by mouth with a glass of water. Follow the directions on the prescription label. You can take it with or without food. If it upsets your stomach, take it with food. Take your medicine at regular intervals. Do not take it more often than directed. Do not stop taking except on your doctor's advice. If you are directed to break the 600 or 800 mg tablets in half as part of your dose, the extra half tablet should be used for the next dose. If you have not used the extra half tablet within 28 days, it should be thrown away. A special  MedGuide will be given to you by the pharmacist with each prescription and refill. Be sure to read this information carefully each time. Talk to your pediatrician regarding the use of this medicine in children. While this drug may be prescribed for children as young as 3 years for selected conditions, precautions do apply. Overdosage: If you think you have taken too much of this medicine contact a poison control center or emergency room at once. NOTE: This medicine is only for you. Do not share this medicine with others. What if I miss a dose? If you miss a dose, take it as soon as you can. If it is almost time for your next dose, take only that dose. Do not take double or extra doses. What may interact with this medicine? This medicine may interact with the following medications:  alcohol  antihistamines for  allergy, cough, and cold  certain medicines for anxiety or sleep  certain medicines for depression like amitriptyline, fluoxetine, sertraline  certain medicines for seizures like phenobarbital, primidone  certain medicines for stomach problems  general anesthetics like halothane, isoflurane, methoxyflurane, propofol  local anesthetics like lidocaine, pramoxine, tetracaine  medicines that relax muscles for surgery  narcotic medicines for pain  phenothiazines like chlorpromazine, mesoridazine, prochlorperazine, thioridazine This list may not describe all possible interactions. Give your health care provider a list of all the medicines, herbs, non-prescription drugs, or dietary supplements you use. Also tell them if you smoke, drink alcohol, or use illegal drugs. Some items may interact with your medicine. What should I watch for while using this medicine? Visit your doctor or health care provider for regular checks on your progress. You may want to keep a record at home of how you feel your condition is responding to treatment. You may want to share this information with your doctor or health care provider at each visit. You should contact your doctor or health care provider if your seizures get worse or if you have any new types of seizures. Do not stop taking this medicine or any of your seizure medicines unless instructed by your doctor or health care provider. Stopping your medicine suddenly can increase your seizures or their severity. This medicine may cause serious skin reactions. They can happen weeks to months after starting the medicine. Contact your health care provider right away if you notice fevers or flu-like symptoms with a rash. The rash may be red or purple and then turn into blisters or peeling of the skin. Or, you might notice a red rash with swelling of the face, lips or lymph nodes in your neck or under your arms. Wear a medical identification bracelet or chain if you are  taking this medicine for seizures, and carry a card that lists all your medications. You may get drowsy, dizzy, or have blurred vision. Do not drive, use machinery, or do anything that needs mental alertness until you know how this medicine affects you. To reduce dizzy or fainting spells, do not sit or stand up quickly, especially if you are an older patient. Alcohol can increase drowsiness and dizziness. Avoid alcoholic drinks. Your mouth may get dry. Chewing sugarless gum or sucking hard candy, and drinking plenty of water will help. The use of this medicine may increase the chance of suicidal thoughts or actions. Pay special attention to how you are responding while on this medicine. Any worsening of mood, or thoughts of suicide or dying should be reported to your health care provider right away. Women who become  pregnant while using this medicine may enroll in the [place] Pregnancy Registry by calling [phone]. This registry collects information about the safety of antiepileptic drug use during pregnancy. What side effects may I notice from receiving this medicine? Side effects that you should report to your doctor or health care professional as soon as possible:  allergic reactions like skin rash, itching or hives, swelling of the face, lips, or tongue  breathing problems  rash, fever, and swollen lymph nodes  redness, blistering, peeling or loosening of the skin, including inside the mouth  suicidal thoughts, mood changes Side effects that usually do not require medical attention (report to your doctor or health care professional if they continue or are bothersome):  dizziness  drowsiness  headache  nausea, vomiting  swelling of ankles, feet, hands  tiredness This list may not describe all possible side effects. Call your doctor for medical advice about side effects. You may report side effects to FDA at 1-800-FDA-1088. Where should I keep my  medicine? Keep out of reach of children. This medicine may cause accidental overdose and death if it taken by other adults, children, or pets. Mix any unused medicine with a substance like cat litter or coffee grounds. Then throw the medicine away in a sealed container like a sealed bag or a coffee can with a lid. Do not use the medicine after the expiration date. Store at room temperature between 15 and 30 degrees C (59 and 86 degrees F). NOTE: This sheet is a summary. It may not cover all possible information. If you have questions about this medicine, talk to your doctor, pharmacist, or health care provider.  2020 Elsevier/Gold Standard (2018-05-28 14:16:43)

## 2020-02-25 LAB — B12 AND FOLATE PANEL
Folate: 5.7 ng/mL (ref >3.0)
Vitamin B-12: 231 pg/mL — ABNORMAL LOW (ref 232–1245)

## 2020-02-25 LAB — METHYLMALONIC ACID, SERUM: Methylmalonic Acid: 141 nmol/L (ref 0–378)

## 2020-02-27 ENCOUNTER — Telehealth: Payer: Self-pay | Admitting: *Deleted

## 2020-02-27 NOTE — Telephone Encounter (Signed)
-----   Message from Melvenia Beam, MD sent at 02/24/2020 10:18 AM EST ----- Paige Hansen: Patient has B12 deficiency. B12 deficiency can cause weakness, fatigue, easy bruising or bleeding,sore tongue, stomach upset, weight loss, and diarrhea or constipation, tingling or numbness to the fingers and toes, difficulty walking, mood changes, depression, memory loss, disorientation and, in severe cases, dementia. Recommend 1000-2000 mcg B12 daily long term can buy it over the counter. recheck B12 in 8-12 weeks with pcp. It is important to check B12 again as some people do not absorb B12 orally and in that case you would have to take injections. We want B12 levels above 400(CC Denice Paradise). Thanks.

## 2020-02-27 NOTE — Telephone Encounter (Signed)
Called patient and LVM (ok per DPR) with a detailed message of pt's labs from Dr Jaynee Eagles. I left the office number in the message and asked pt to call back or send mychart message to confirm receipt of this message as there are strong recommendations to begin Vitamin B12 supplementation oral daily. Also encouraged pt to call with any questions and that primary care should manage this going forward and recheck in 8-12 weeks.

## 2020-02-29 NOTE — Telephone Encounter (Signed)
Spoke with pt. She did get my message. She will purchase OTC B12 1000-2000 mcg to take daily. Pt's questions were answered. She will f/u with her PCP Denice Paradise for recheck. Also stated she has about 7 of the symptoms of B12 deficiency that were mentioned. She verbalized appreciation for the call.

## 2020-03-01 ENCOUNTER — Encounter: Payer: Self-pay | Admitting: Cardiology

## 2020-03-01 ENCOUNTER — Other Ambulatory Visit: Payer: Self-pay

## 2020-03-01 ENCOUNTER — Ambulatory Visit: Payer: 59 | Admitting: Cardiology

## 2020-03-01 VITALS — BP 110/82 | HR 79 | Ht 62.0 in | Wt 205.0 lb

## 2020-03-01 DIAGNOSIS — R0600 Dyspnea, unspecified: Secondary | ICD-10-CM

## 2020-03-01 DIAGNOSIS — R0609 Other forms of dyspnea: Secondary | ICD-10-CM

## 2020-03-01 DIAGNOSIS — I10 Essential (primary) hypertension: Secondary | ICD-10-CM

## 2020-03-01 DIAGNOSIS — Z6837 Body mass index (BMI) 37.0-37.9, adult: Secondary | ICD-10-CM

## 2020-03-01 NOTE — Patient Instructions (Signed)
Medication Instructions:  Your physician recommends that you continue on your current medications as directed. Please refer to the Current Medication list given to you today.   *If you need a refill on your cardiac medications before your next appointment, please call your pharmacy*   Lab Work: None Ordered If you have labs (blood work) drawn today and your tests are completely normal, you will receive your results only by: Marland Kitchen MyChart Message (if you have MyChart) OR . A paper copy in the mail If you have any lab test that is abnormal or we need to change your treatment, we will call you to review the results.   Testing/Procedures: None Ordered   Follow-Up: At [facility], you and your health needs are our priority.  As part of our continuing mission to provide you with exceptional heart care, we have created designated Provider Care Teams.  These Care Teams include your primary Cardiologist (physician) and Advanced Practice Providers (APPs -  Physician Assistants and Nurse Practitioners) who all work together to provide you with the care you need, when you need it.  We recommend signing up for the patient portal called "MyChart".  Sign up information is provided on this After Visit Summary.  MyChart is used to connect with patients for Virtual Visits (Telemedicine).  Patients are able to view lab/test results, encounter notes, upcoming appointments, etc.  Non-urgent messages can be sent to your provider as well.   To learn more about what you can do with MyChart, go to [email or web address].    Your next appointment:   Follow up as needed   The format for your next appointment:   In Person  Provider:   Kate Sable, MD   Other Instructions

## 2020-03-01 NOTE — Progress Notes (Signed)
Cardiology Office Note:    Date:  03/01/2020   ID:  Paige Hansen, DOB [birth year], MRN [id number]  PCP:  Marval Regal, NP  [facility] Cardiologist:  Kate Sable, MD  [place] Electrophysiologist:  None   Referring MD: Marval Regal, NP   Chief Complaint  Patient presents with  . Follow-up    F/U after cardiac testing     History of Present Illness:    Paige Hansen is a 39 y.o. female with a hx of obesity, hypertension who presents for follow-up.  Last seen due to shortness of breath with exertion.  Also endorsed some chest discomfort.  Echocardiogram and coronary CTA was performed to evaluate presence of CAD.  She still has shortness of breath especially when she overexerts herself.  Symptoms resolved with rest.  Has no new concerns at this time.   Past Medical History:  Diagnosis Date  . Abdominal pain, epigastric 06/24/2012  . Allergy   . Anxiety   . Colitis   . Depression    Post Partum   . Diarrhea 06/24/2012  . Frequent headaches   . GERD (gastroesophageal reflux disease)   . Hemorrhoids   . Hypertension     Past Surgical History:  Procedure Laterality Date  . COLONOSCOPY    . ESSURE TUBAL LIGATION  04/2009  . none    . WISDOM TOOTH EXTRACTION     1998    Current Medications: Current Meds  Medication Sig  . calcium carbonate (TUMS - DOSED IN MG ELEMENTAL CALCIUM) 500 MG chewable tablet Chew 1 tablet by mouth daily.  . Cholecalciferol 25 MCG (1000 UT) capsule Take 1 capsule (1,000 Units total) by mouth daily.  . cyclobenzaprine (FLEXERIL) 10 MG tablet Take 10 mg by mouth as needed.  . gabapentin (NEURONTIN) 100 MG capsule Take 1 capsule (100 mg total) by mouth 3 (three) times daily.  . metoprolol succinate (TOPROL-XL) 25 MG 24 hr tablet Take 1 tablet (25 mg total) by mouth daily.     Allergies:   Latex   Social History   Socioeconomic History  . Marital status: Married    Spouse name: Not on file  . Number of children:  2  . Years of education: Not on file  . Highest education level: Not on file  Occupational History  . Occupation: [employer]    Employer: [employer]  Tobacco Use  . Smoking status: Never Smoker  . Smokeless tobacco: Never Used  Vaping Use  . Vaping Use: Never used  Substance and Sexual Activity  . Alcohol use: No  . Drug use: No  . Sexual activity: Yes    Birth control/protection: Other-see comments    Comment: essure implant   Other Topics Concern  . Not on file  Social History Narrative   Lives at home with husband and children   Right handed   Caffeine: 2 cups/day   Social Determinants of Health   Financial Resource Strain: Not on file  Food Insecurity: Not on file  Transportation Needs: Not on file  Physical Activity: Not on file  Stress: Not on file  Social Connections: Not on file     Family History: The patient's family history includes Alcohol abuse in her brother and father; Arthritis in her maternal grandfather and maternal grandmother; Asthma in her brother and daughter; Breast cancer in her maternal grandmother; Cancer in her maternal grandfather and maternal grandmother; Diabetes in her maternal grandfather, mother, and paternal grandmother; Hearing loss in  her maternal grandfather; Heart attack in her maternal grandfather; Heart disease in her maternal grandfather and paternal grandmother; Hyperlipidemia in her maternal grandfather, maternal grandmother, and mother; Hypertension in her maternal grandfather, maternal grandmother, mother, and paternal grandmother; Intellectual disability in her daughter; Kidney disease in her maternal grandfather and maternal grandmother. There is no history of Colon cancer, Esophageal cancer, Rectal cancer, Stomach cancer, Colon polyps, Headache, or Migraines.  ROS:   Please see the history of present illness.     All other systems reviewed and are negative.  EKGs/Labs/Other Studies Reviewed:    The following studies were reviewed  today:   EKG:  EKG not ordered today.   Recent Labs: 11/30/2019: ALT 13; BUN 9; Hemoglobin 14.0; Platelets 236.0; Potassium 3.7; Sodium 138; TSH 1.73 01/18/2020: Creatinine, Ser 0.60  Recent Lipid Panel    Component Value Date/Time   CHOL 134 11/30/2019 1116   TRIG 88.0 11/30/2019 1116   HDL 39.60 11/30/2019 1116   CHOLHDL 3 11/30/2019 1116   VLDL 17.6 11/30/2019 1116   LDLCALC 77 11/30/2019 1116     Risk Assessment/Calculations:      Physical Exam:    VS:  BP 110/82 (BP Location: Left Arm, Patient Position: Sitting, Cuff Size: Large)   Pulse 79   Ht 5' 2"  (1.575 m)   Wt 205 lb (93 kg)   SpO2 98%   BMI 37.49 kg/m     Wt Readings from Last 3 Encounters:  03/01/20 205 lb (93 kg)  02/21/20 206 lb (93.4 kg)  01/25/20 205 lb 8 oz (93.2 kg)     GEN:  Well nourished, well developed in no acute distress HEENT: Normal NECK: No JVD; No carotid bruits LYMPHATICS: No lymphadenopathy CARDIAC: RRR, no murmurs, rubs, gallops RESPIRATORY:  Clear to auscultation without rales, wheezing or rhonchi  ABDOMEN: Soft, non-tender, non-distended MUSCULOSKELETAL:  No edema; No deformity  SKIN: Warm and dry NEUROLOGIC:  Alert and oriented x 3 PSYCHIATRIC:  Normal affect   ASSESSMENT:    1. Dyspnea on exertion   2. Primary hypertension   3. BMI 37.0-37.9, adult    PLAN:    In order of problems listed above:  1. Dyspnea on exertion, chest pain.  Risk factors of obesity, hypertension.  Echo showed normal systolic and diastolic function, EF 60 to 65%.  Coronary CTA with calcium score of 0, no evidence of CAD.  Patient made aware of results.  Deconditioning/obesity likely contributing to symptoms.  She was advised to start a graduated exercise program, accompanied with eating healthier and weight loss. 2. History of hypertension, BP controlled.  Continue Toprol-XL  3. Patient is obese, low-calorie diet, weight loss advised.  Follow-up as needed   Medication Adjustments/Labs and  Tests Ordered: Current medicines are reviewed at length with the patient today.  Concerns regarding medicines are outlined above.  No orders of the defined types were placed in this encounter.  No orders of the defined types were placed in this encounter.   Patient Instructions  Medication Instructions:  Your physician recommends that you continue on your current medications as directed. Please refer to the Current Medication list given to you today.   *If you need a refill on your cardiac medications before your next appointment, please call your pharmacy*   Lab Work: None Ordered If you have labs (blood work) drawn today and your tests are completely normal, you will receive your results only by: Marland Kitchen MyChart Message (if you have MyChart) OR . A paper copy in  the mail If you have any lab test that is abnormal or we need to change your treatment, we will call you to review the results.   Testing/Procedures: None Ordered   Follow-Up: At [facility], you and your health needs are our priority.  As part of our continuing mission to provide you with exceptional heart care, we have created designated Provider Care Teams.  These Care Teams include your primary Cardiologist (physician) and Advanced Practice Providers (APPs -  Physician Assistants and Nurse Practitioners) who all work together to provide you with the care you need, when you need it.  We recommend signing up for the patient portal called "MyChart".  Sign up information is provided on this After Visit Summary.  MyChart is used to connect with patients for Virtual Visits (Telemedicine).  Patients are able to view lab/test results, encounter notes, upcoming appointments, etc.  Non-urgent messages can be sent to your provider as well.   To learn more about what you can do with MyChart, go to [email or web address].    Your next appointment:   Follow up as needed   The format for your next appointment:   In  Person  Provider:   Kate Sable, MD   Other Instructions      Signed, Kate Sable, MD  03/01/2020 12:55 PM    [place]

## 2020-04-02 ENCOUNTER — Other Ambulatory Visit: Payer: Self-pay | Admitting: Emergency Medicine

## 2020-04-02 ENCOUNTER — Telehealth: Payer: 59 | Admitting: Emergency Medicine

## 2020-04-02 ENCOUNTER — Telehealth: Payer: Self-pay | Admitting: Family Medicine

## 2020-04-02 DIAGNOSIS — J029 Acute pharyngitis, unspecified: Secondary | ICD-10-CM | POA: Diagnosis not present

## 2020-04-02 MED ORDER — BENZONATATE 100 MG PO CAPS: 100.0000 mg | ORAL_CAPSULE | Freq: Two times a day (BID) | ORAL | 0 refills | Status: DC | PRN

## 2020-04-02 MED ORDER — AZITHROMYCIN 250 MG PO TABS: ORAL_TABLET | ORAL | 0 refills | Status: DC

## 2020-04-02 MED ORDER — FLUTICASONE PROPIONATE 50 MCG/ACT NA SUSP: 2.0000 | Freq: Every day | NASAL | 0 refills | Status: DC

## 2020-04-02 NOTE — Telephone Encounter (Signed)
Pt sent in appointment request with sore throat, runny nose and congestion. Advised pt to reach out to their primary care doctor for treatment as we don't treat cold/flu symptoms.

## 2020-04-02 NOTE — Addendum Note (Signed)
Addended by: Montine Circle B on: 04/02/2020 01:39 PM   Modules accepted: Orders

## 2020-04-02 NOTE — Progress Notes (Signed)
We are sorry you are not feeling well.  Here is how we plan to help!  Based on what you have shared with me, it looks like you may have a upper respiratory infection.  Upper respiratory infections are caused by a large number of viruses; however, rhinovirus is the most common cause.   However, based on the length of your symptoms, it may be worthwhile trying and antibiotic.  I've sent over a z-pak to your pharmacy.  Symptoms vary from person to person, with common symptoms including sore throat, cough, fatigue or lack of energy and feeling of general discomfort.  A low-grade fever of up to 100.4 may present, but is often uncommon.  Symptoms vary however, and are closely related to a person's age or underlying illnesses.  The most common symptoms associated with an upper respiratory infection are nasal discharge or congestion, cough, sneezing, headache and pressure in the ears and face.  These symptoms usually persist for about 3 to 10 days, but can last up to 2 weeks.  It is important to know that upper respiratory infections do not cause serious illness or complications in most cases.    Upper respiratory infections can be transmitted from person to person, with the most common method of transmission being a person's hands.  The virus is able to live on the skin and can infect other persons for up to 2 hours after direct contact.  Also, these can be transmitted when someone coughs or sneezes; thus, it is important to cover the mouth to reduce this risk.  To keep the spread of the illness at [place], good hand hygiene is very important.  This is an infection that is most likely caused by a virus. There are no specific treatments other than to help you with the symptoms until the infection runs its course.  We are sorry you are not feeling well.  Here is how we plan to help!   For nasal congestion, you may use an oral decongestants such as Mucinex D or if you have glaucoma or high blood pressure use plain  Mucinex.  Saline nasal spray or nasal drops can help and can safely be used as often as needed for congestion.  For your congestion, I have prescribed Fluticasone nasal spray one spray in each nostril twice a day  If you do not have a history of heart disease, hypertension, diabetes or thyroid disease, prostate/bladder issues or glaucoma, you may also use Sudafed to treat nasal congestion.  It is highly recommended that you consult with a pharmacist or your primary care physician to ensure this medication is safe for you to take.     If you have a cough, you may use cough suppressants such as Delsym and Robitussin.  If you have glaucoma or high blood pressure, you can also use Coricidin HBP.   For cough I have prescribed for you A prescription cough medication called Tessalon Perles 100 mg. You may take 1-2 capsules every 8 hours as needed for cough  If you have a sore or scratchy throat, use a saltwater gargle-  to  teaspoon of salt dissolved in a 4-ounce to 8-ounce glass of warm water.  Gargle the solution for approximately 15-30 seconds and then spit.  It is important not to swallow the solution.  You can also use throat lozenges/cough drops and Chloraseptic spray to help with throat pain or discomfort.  Warm or cold liquids can also be helpful in relieving throat pain.  For headache,  pain or general discomfort, you can use Ibuprofen or Tylenol as directed.   Some authorities believe that zinc sprays or the use of Echinacea may shorten the course of your symptoms.   HOME CARE . Only take medications as instructed by your medical team. . Be sure to drink plenty of fluids. Water is fine as well as fruit juices, sodas and electrolyte beverages. You may want to stay away from caffeine or alcohol. If you are nauseated, try taking small sips of liquids. How do you know if you are getting enough fluid? Your urine should be a pale yellow or almost colorless. . Get rest. . Taking a steamy shower or  using a humidifier may help nasal congestion and ease sore throat pain. You can place a towel over your head and breathe in the steam from hot water coming from a faucet. . Using a saline nasal spray works much the same way. . Cough drops, hard candies and sore throat lozenges may ease your cough. . Avoid close contacts especially the very young and the elderly . Cover your mouth if you cough or sneeze . Always remember to wash your hands.   GET HELP RIGHT AWAY IF: . You develop worsening fever. . If your symptoms do not improve within 10 days . You develop yellow or green discharge from your nose over 3 days. . You have coughing fits . You develop a severe head ache or visual changes. . You develop shortness of breath, difficulty breathing or start having chest pain . Your symptoms persist after you have completed your treatment plan  MAKE SURE YOU   Understand these instructions.  Will watch your condition.  Will get help right away if you are not doing well or get worse.  Your e-visit answers were reviewed by a board certified advanced clinical practitioner to complete your personal care plan. Depending upon the condition, your plan could have included both over the counter or prescription medications. Please review your pharmacy choice. If there is a problem, you may call our nursing hot line at and have the prescription routed to another pharmacy. Your safety is important to [place]. If you have drug allergies check your prescription carefully.   You can use MyChart to ask questions about today's visit, request a non-urgent call back, or ask for a work or school excuse for 24 hours related to this e-Visit. If it has been greater than 24 hours you will need to follow up with your provider, or enter a new e-Visit to address those concerns. You will get an e-mail in the next two days asking about your experience.  I hope that your e-visit has been valuable and will speed your recovery.  Thank you for using e-visits.     Approximately 5 minutes was used in reviewing the patient's chart, questionnaire, prescribing medications, and documentation.

## 2020-04-26 ENCOUNTER — Encounter: Payer: Self-pay | Admitting: Family Medicine

## 2020-05-15 ENCOUNTER — Ambulatory Visit: Payer: 59 | Admitting: Family

## 2020-05-24 ENCOUNTER — Encounter: Payer: Self-pay | Admitting: Physical Therapy

## 2020-05-24 ENCOUNTER — Ambulatory Visit: Payer: 59 | Attending: Neurology | Admitting: Physical Therapy

## 2020-05-24 ENCOUNTER — Other Ambulatory Visit: Payer: Self-pay

## 2020-05-24 DIAGNOSIS — M542 Cervicalgia: Secondary | ICD-10-CM | POA: Diagnosis not present

## 2020-05-24 NOTE — Therapy (Signed)
[place], [place], [address] Phone: [phone]   Fax:  [phone]  Physical Therapy Evaluation  Patient Details  Name: Paige Hansen MRN: [id number] Date of Birth: December 22, 1980 Referring Provider (PT): Sarina Ill, MD   Encounter Date: 05/24/2020   PT End of Session - 05/24/20 1329    Visit Number 1    Number of Visits 12    Date for PT Re-Evaluation 07/05/20    Authorization Type [facility], recheck FOTO visit 6    PT Start Time [phone]    PT Stop Time 0930    PT Time Calculation (min) 38 min    Activity Tolerance Patient tolerated treatment well    Behavior During Therapy [facility] for tasks assessed/performed           Past Medical History:  Diagnosis Date  . Abdominal pain, epigastric 06/24/2012  . Allergy   . Anxiety   . Colitis   . Depression    Post Partum   . Diarrhea 06/24/2012  . Frequent headaches   . GERD (gastroesophageal reflux disease)   . Hemorrhoids   . Hypertension     Past Surgical History:  Procedure Laterality Date  . COLONOSCOPY    . ESSURE TUBAL LIGATION  04/2009  . none    . WISDOM TOOTH EXTRACTION     1998    There were no vitals filed for this visit.    Subjective Assessment - 05/24/20 0935    Subjective Pt. is a 40 y/o female referred to PT for c/o right occipital region headaches and neck pain with onset after getting Covid vaccine last August. Symptoms include constant dull headache from right occipital region extending into right cervical paraspinals and upper trapezius region with associated skin sensitivity as well as photosensitivity, pt. also reports that her vision gets blurry if not taking medications to address. Brain MRI (-). X-rays (cervical) revealed decreased lordosis otherwise (-). Pt. had nerve block which provided temporary relief x about 2 weeks. Ablation is being considered but pt. wants to try PT first to see if any relief can be achieved.     Pertinent History HTN, anxiety/depression    Diagnostic tests Brain MRI, cervical X-rays    Patient Stated Goals Resolve headaches, if possible avoid ablation procedure    Currently in Pain? Yes    Pain Score 4     Pain Location Head    Pain Orientation Posterior;Right;Lower    Pain Descriptors / Indicators Dull    Pain Type Chronic pain    Pain Radiating Towards right cervical and upper trapezius region    Pain Onset More than a month ago    Pain Frequency Constant    Aggravating Factors  movement, bright lights    Pain Relieving Factors medication    Effect of Pain on Daily Activities limits ADL tolerance and ability UE functional use              [facility] PT Assessment - 05/24/20 0001      Assessment   Medical Diagnosis Occipital neuralgia    Referring Provider (PT) Sarina Ill, MD    Onset Date/Surgical Date 11/08/19    Hand Dominance Right    Prior Therapy none      Precautions   Precautions None      Restrictions   Weight Bearing Restrictions No      Balance Screen   Has the patient fallen in the past 6 months No  Prior Function   Level of Independence Independent with basic ADLs;Independent with community mobility without device    Vocation --   nurse tech     Cognition   Overall Cognitive Status Within Functional Limits for tasks assessed      Observation/Other Assessments   Focus on Therapeutic Outcomes (FOTO)  59% function      Sensation   Light Touch Appears Intact    Additional Comments hypersensitivity extending from right occipital region into cervical paraspinals and upper trapezius on right otherwise C3-T1 dermatomal screen intact      Posture/Postural Control   Posture/Postural Control Postural limitations    Postural Limitations Rounded Shoulders      Deep Tendon Reflexes   DTR Assessment Site Biceps;Brachioradialis;Triceps    Biceps DTR 2+    Brachioradialis DTR 2+    Triceps DTR 2+      ROM / Strength   AROM / PROM / Strength  AROM;Strength      AROM   Overall AROM Comments Bilateral shoulder AROM grossly [facility]    AROM Assessment Site Cervical    Cervical Flexion 40    Cervical Extension 30    Cervical - Right Side Bend 27    Cervical - Left Side Bend 29    Cervical - Right Rotation 58    Cervical - Left Rotation 60      Strength   Overall Strength Comments Bilat. UE MMTs grossly 5/5      Flexibility   Soft Tissue Assessment /Muscle Length --   tight right upper trapezius     Palpation   Palpation comment TTP right occipital region, cervical paraspinals and upper trapezius                      Objective measurements completed on examination: See above findings.       [facility] Adult PT Treatment/Exercise - 05/24/20 0001      Exercises   Exercises --   brief HEP handout review           Trigger Point Dry Needling - 05/24/20 0001    Consent Given? Yes    Education Handout Provided Yes    Muscles Treated Head and Neck Upper trapezius;Suboccipitals;Semispinalis capitus;Cervical multifidi    Dry Needling Comments needling to right side muscles as noted in prone with 30-32 gauge 30 and 50 mm needles                PT Education - 05/24/20 1328    Education Details dry needling, HEP, POC, FOTO patient report    Person(s) Educated Patient    Methods Explanation;Demonstration;Verbal cues;Handout    Comprehension Verbalized understanding;Returned demonstration               PT Long Term Goals - 05/24/20 1337      PT LONG TERM GOAL #1   Title Independent with HEP    Baseline needs HEP    Time 6    Period Weeks    Status New    Target Date 07/05/20      PT LONG TERM GOAL #2   Title Improve FOTO outcome measure score to 71% or greater functional status    Baseline 59% function    Time 6    Period Weeks    Status New    Target Date 07/05/20      PT LONG TERM GOAL #3   Title Increase bilateral cervical rotation AROM at least 5-10 deg to improve aiblity  to turn head  for activities such as driving    Baseline right 58 deg, left 60 deg    Time 6    Period Weeks    Status New    Target Date 07/05/20      PT LONG TERM GOAL #4   Title Decrease headache intensity by at [place] 50% from baseline status and pain from constant to intermittent to improve tolerance ADLs and lifting activities    Time 6    Period Weeks    Status New    Target Date 07/05/20                  Plan - 05/24/20 1330    Clinical Impression Statement Pt. presents with occipital neuralgia and associated cervical and upper trapezius pain with associated cervical muscle tightness and potential contributing postural factors (to muscle tension) with onset reported after Covid vaccine last Summer. Plan trial PT including dry needling treatment to see if symptom relief can be achieved and to work on addressing associated functional limitations for ADLs/IADLs.    Personal Factors and Comorbidities Time since onset of injury/illness/exacerbation;Comorbidity 2    Comorbidities see PMH    Examination-Activity Limitations Lift;Reach Overhead    Examination-Participation Restrictions Cleaning;Driving    Stability/Clinical Decision Making Stable/Uncomplicated    Clinical Decision Making Low    Rehab Potential Fair    PT Frequency --   1-2x/week   PT Duration 6 weeks    PT Treatment/Interventions ADLs/Self Care Home Management;Cryotherapy;Electrical Stimulation;Moist Heat;Therapeutic activities;Therapeutic exercise;Patient/family education;Manual techniques;Dry needling;Taping;Neuromuscular re-education;Ultrasound    PT Next Visit Plan Review HEP as needed, check response dry needling and continue as found beneficial, gentle manual, stretches, worl on postural training/add T-band exercises as tolerated for periscapular strengthening    PT Home Exercise Plan Access code: [email or web address]    Consulted and Agree with Plan of Care Patient           Patient will benefit from skilled therapeutic  intervention in order to improve the following deficits and impairments:  Decreased activity tolerance,Impaired UE functional use,Impaired flexibility,Increased fascial restricitons,Decreased range of motion,Increased muscle spasms,Postural dysfunction,Pain  Visit Diagnosis: Cervicalgia     Problem List Patient Active Problem List   Diagnosis Date Noted  . Occipital neuralgia of right side 02/21/2020  . Cervico-occipital neuralgia of right side 02/21/2020  . Cervicalgia 01/25/2020  . Vitamin D deficiency 12/02/2019  . New daily persistent headache 11/30/2019  . Chest pain on breathing 11/30/2019  . Elevated heart rate with elevated blood pressure and diagnosis of hypertension 11/30/2019  . DOE (dyspnea on exertion) 11/30/2019  . Ulcerative colitis, unspecified 10/19/2013  . GERD (gastroesophageal reflux disease) 10/19/2013  . Rectal bleeding 06/24/2012  . Hemorrhoids 06/24/2012    Beaulah Dinning, PT, DPT 05/24/20 1:42 PM  [facility] [address] [place], [place], [address] Phone: [phone]   Fax:  [phone]  Name: Paige Hansen MRN: [id number] Date of Birth: [birth year]

## 2020-05-24 NOTE — Patient Instructions (Addendum)

## 2020-05-29 ENCOUNTER — Other Ambulatory Visit: Payer: Self-pay

## 2020-05-29 ENCOUNTER — Ambulatory Visit: Payer: 59 | Admitting: Family Medicine

## 2020-05-29 ENCOUNTER — Encounter: Payer: Self-pay | Admitting: Family Medicine

## 2020-05-29 ENCOUNTER — Other Ambulatory Visit: Payer: Self-pay | Admitting: Family Medicine

## 2020-05-29 VITALS — BP 128/80 | HR 80 | Ht 62.0 in | Wt 207.0 lb

## 2020-05-29 DIAGNOSIS — R0683 Snoring: Secondary | ICD-10-CM

## 2020-05-29 DIAGNOSIS — M542 Cervicalgia: Secondary | ICD-10-CM | POA: Diagnosis not present

## 2020-05-29 DIAGNOSIS — R519 Headache, unspecified: Secondary | ICD-10-CM | POA: Diagnosis not present

## 2020-05-29 DIAGNOSIS — E538 Deficiency of other specified B group vitamins: Secondary | ICD-10-CM

## 2020-05-29 DIAGNOSIS — M5481 Occipital neuralgia: Secondary | ICD-10-CM

## 2020-05-29 MED ORDER — GABAPENTIN 100 MG PO CAPS: 100.0000 mg | ORAL_CAPSULE | Freq: Three times a day (TID) | ORAL | 1 refills | Status: DC

## 2020-05-29 MED ORDER — CYCLOBENZAPRINE HCL 10 MG PO TABS: 10.0000 mg | ORAL_TABLET | ORAL | 1 refills | Status: DC | PRN
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
## 2020-05-29 NOTE — Patient Instructions (Signed)
Below is our plan:  We will continue cyclobenzaprine 88m daily and gabapentin up to 3020mdaily. I will recheck B12 today. I will refer you to sleep medicine.   Please make sure you are staying well hydrated. I recommend 50-60 ounces daily. Well balanced diet and regular exercise encouraged. Consistent sleep schedule with 6-8 hours recommended.   Please continue follow up with care team as directed.   Follow up pending sleep consult.   You may receive a survey regarding today's visit. I encourage you to leave honest feed back as I do use this information to improve patient care. Thank you for seeing me today!    Sleep Apnea Sleep apnea affects breathing during sleep. It causes breathing to stop for a short time or to become shallow. It can also increase the risk of:  Heart attack.  Stroke.  Being very overweight (obese).  Diabetes.  Heart failure.  Irregular heartbeat. The goal of treatment is to help you breathe normally again. What are the causes? There are three kinds of sleep apnea:  Obstructive sleep apnea. This is caused by a blocked or collapsed airway.  Central sleep apnea. This happens when the brain does not send the right signals to the muscles that control breathing.  Mixed sleep apnea. This is a combination of obstructive and central sleep apnea. The most common cause of this condition is a collapsed or blocked airway. This can happen if:  Your throat muscles are too relaxed.  Your tongue and tonsils are too large.  You are overweight.  Your airway is too small.   What increases the risk?  Being overweight.  Smoking.  Having a small airway.  Being older.  Being female.  Drinking alcohol.  Taking medicines to calm yourself (sedatives or tranquilizers).  Having family members with the condition. What are the signs or symptoms?  Trouble staying asleep.  Being sleepy or tired during the day.  Getting angry a lot.  Loud  snoring.  Headaches in the morning.  Not being able to focus your mind (concentrate).  Forgetting things.  Less interest in sex.  Mood swings.  Personality changes.  Feelings of sadness (depression).  Waking up a lot during the night to pee (urinate).  Dry mouth.  Sore throat. How is this diagnosed?  Your medical history.  A physical exam.  A test that is done when you are sleeping (sleep study). The test is most often done in a sleep lab but may also be done at home. How is this treated?  Sleeping on your side.  Using a medicine to get rid of mucus in your nose (decongestant).  Avoiding the use of alcohol, medicines to help you relax, or certain pain medicines (narcotics).  Losing weight, if needed.  Changing your diet.  Not smoking.  Using a machine to open your airway while you sleep, such as: ? An oral appliance. This is a mouthpiece that shifts your lower jaw forward. ? A CPAP device. This device blows air through a mask when you breathe out (exhale). ? An EPAP device. This has valves that you put in each nostril. ? A BPAP device. This device blows air through a mask when you breathe in (inhale) and breathe out.  Having surgery if other treatments do not work. It is important to get treatment for sleep apnea. Without treatment, it can lead to:  High blood pressure.  Coronary artery disease.  In men, not being able to have an erection (impotence).  Reduced  thinking ability.   Follow these instructions at home: Lifestyle  Make changes that your doctor recommends.  Eat a healthy diet.  Lose weight if needed.  Avoid alcohol, medicines to help you relax, and some pain medicines.  Do not use any products that contain nicotine or tobacco, such as cigarettes, e-cigarettes, and chewing tobacco. If you need help quitting, ask your doctor. General instructions  Take over-the-counter and prescription medicines only as told by your doctor.  If you  were given a machine to use while you sleep, use it only as told by your doctor.  If you are having surgery, make sure to tell your doctor you have sleep apnea. You may need to bring your device with you.  Keep all follow-up visits as told by your doctor. This is important. Contact a doctor if:  The machine that you were given to use during sleep bothers you or does not seem to be working.  You do not get better.  You get worse. Get help right away if:  Your chest hurts.  You have trouble breathing in enough air.  You have an uncomfortable feeling in your back, arms, or stomach.  You have trouble talking.  One side of your body feels weak.  A part of your face is hanging down. These symptoms may be an emergency. Do not wait to see if the symptoms will go away. Get medical help right away. Call your local emergency services (911 in the [place] Do not drive yourself to the hospital. Summary  This condition affects breathing during sleep.  The most common cause is a collapsed or blocked airway.  The goal of treatment is to help you breathe normally while you sleep. This information is not intended to replace advice given to you by your health care provider. Make sure you discuss any questions you have with your health care provider. Document Revised: 12/11/2017 Document Reviewed: 10/20/2017 Elsevier Patient Education  2021 [place].    Occipital Neuralgia  Occipital neuralgia is a type of headache that causes brief episodes of very bad pain in the back of the head. Pain from occipital neuralgia may spread (radiate) to other parts of the head. These headaches may be caused by irritation of the nerves that leave the spinal cord high up in the neck, just below the base of the skull (occipital nerves). The occipital nerves transmit sensations from the back of the head, the top of the head, and the areas behind the ears. What are the causes? This condition can occur without  any known cause (primary headache syndrome). In other cases, this condition is caused by pressure on or irritation of one of the two occipital nerves. Pressure and irritation may be due to:  Muscle spasm in the neck.  Neck injury.  Wear and tear of the vertebrae in the neck (osteoarthritis).  Disease of the disks that separate the vertebrae.  Swollen blood vessels that put pressure on the occipital nerves.  Infections.  Tumors.  Diabetes. What are the signs or symptoms? This condition causes brief burning, stabbing, electric, shocking, or shooting pain in the back of the head that can radiate to the top of the head. It can happen on one side or both sides of the head. It can also cause:  Pain behind the eye.  Pain triggered by neck movement or hair brushing.  Scalp tenderness.  Aching in the back of the head between episodes of very bad pain.  Pain that gets worse with  exposure to bright lights. How is this diagnosed? Your health care provider may diagnose the condition based on a physical exam and your symptoms. Tests may be done, such as:  Imaging studies of the brain and neck (cervical spine), such as an MRI or CT scan. These look for causes of pinched nerves.  Applying pressure to the nerves in the neck to try to re-create the pain.  Injection of numbing medicine into the occipital nerve areas to see if pain goes away (diagnostic nerve block).   How is this treated? Treatment for this condition may begin with simple measures, such as:  Rest.  Massage.  Applying heat or cold to the area.  Over-the-counter pain relievers. If these measures do not work, you may need other treatments, including:  Medicines, such as: ? Prescription-strength anti-inflammatory medicines. ? Muscle relaxants. ? Anti-seizure medicines, which can relieve pain. ? Antidepressants, which can relieve pain. ? Injected medicines, such as medicines that numb the area (local anesthetic) and  steroids.  Pulsed radiofrequency ablation. This is when wires are implanted to deliver electrical impulses that block pain signals from the occipital nerve.  Surgery to relieve nerve pressure.  Physical therapy. Follow these instructions at home: Managing pain  Avoid any activities that cause pain.  Rest when you have an attack of pain.  Try gentle massage to relieve pain.  Try a different pillow or sleeping position.  If directed, apply heat to the affected area as often as told by your health care provider. Use the heat source that your health care provider recommends, such as a moist heat pack or a heating pad. ? Place a towel between your skin and the heat source. ? Leave the heat on for 20-30 minutes. ? Remove the heat if your skin turns bright red. This is especially important if you are unable to feel pain, heat, or cold. You have a greater risk of getting burned.  If directed, put ice on the back of your head and neck area. To do this: ? Put ice in a plastic bag. ? Place a towel between your skin and the bag. ? Leave the ice on for 20 minutes, 2-3 times a day. ? Remove the ice if your skin turns bright red. This is very important. If you cannot feel pain, heat, or cold, you have a greater risk of damage to the area.      General instructions  Take over-the-counter and prescription medicines only as told by your health care provider.  Avoid things that make your symptoms worse, such as bright lights.  Try to stay active. Get regular exercise that does not cause pain. Ask your health care provider to suggest safe exercises for you.  Work with a physical therapist to learn stretching exercises you can do at home.  Practice good posture.  Keep all follow-up visits. This is important. Contact a health care provider if:  Your medicine is not working.  You have new or worsening symptoms. Get help right away if:  You have very bad head pain that does not go  away.  You have a sudden change in vision, balance, or speech. These symptoms may represent a serious problem that is an emergency. Do not wait to see if the symptoms will go away. Get medical help right away. Call your local emergency services (911 in the [place] Do not drive yourself to the hospital. Summary  Occipital neuralgia is a type of headache that causes brief episodes of very bad pain  in the back of the head.  Pain from occipital neuralgia may spread (radiate) to other parts of the head.  Treatment for this condition includes rest, massage, and medicines. This information is not intended to replace advice given to you by your health care provider. Make sure you discuss any questions you have with your health care provider. Document Revised: 12/25/2019 Document Reviewed: 12/25/2019 Elsevier Patient Education  2021 [employer].

## 2020-05-30 LAB — VITAMIN B12: Vitamin B-12: 268 pg/mL (ref 232–1245)

## 2020-06-08 ENCOUNTER — Encounter: Payer: Self-pay | Admitting: Physical Therapy

## 2020-06-08 ENCOUNTER — Other Ambulatory Visit: Payer: Self-pay

## 2020-06-08 ENCOUNTER — Ambulatory Visit: Payer: 59 | Attending: Neurology | Admitting: Physical Therapy

## 2020-06-08 DIAGNOSIS — M542 Cervicalgia: Secondary | ICD-10-CM | POA: Diagnosis not present

## 2020-06-08 NOTE — Patient Instructions (Signed)

## 2020-06-08 NOTE — Therapy (Signed)
[place], [place], [address] Phone: [phone]   Fax:  [phone]  Physical Therapy Treatment  Patient Details  Name: Paige Hansen MRN: [id number] Date of Birth: 24-Aug-1980 Referring Provider (PT): Sarina Ill, MD   Encounter Date: 06/08/2020   PT End of Session - 06/08/20 0904    Visit Number 2    Number of Visits 12    Date for PT Re-Evaluation 07/05/20    Authorization Type [facility] [employer], recheck FOTO visit 6    PT Start Time [phone]    PT Stop Time 0928    PT Time Calculation (min) 42 min    Activity Tolerance Patient tolerated treatment well    Behavior During Therapy [facility] for tasks assessed/performed           Past Medical History:  Diagnosis Date  . Abdominal pain, epigastric 06/24/2012  . Allergy   . Anxiety   . Colitis   . Depression    Post Partum   . Diarrhea 06/24/2012  . Frequent headaches   . GERD (gastroesophageal reflux disease)   . Hemorrhoids   . Hypertension     Past Surgical History:  Procedure Laterality Date  . COLONOSCOPY    . ESSURE TUBAL LIGATION  04/2009  . none    . WISDOM TOOTH EXTRACTION     1998    There were no vitals filed for this visit.   Subjective Assessment - 06/08/20 0849    Subjective " I am doing okay, I am still have headaches daily."    Patient Stated Goals Resolve headaches, if possible avoid ablation procedure    Currently in Pain? Yes    Pain Score 3     Pain Orientation Right    Pain Descriptors / Indicators Dull;Aching    Pain Type Chronic pain    Pain Onset More than a month ago    Pain Frequency Intermittent    Aggravating Factors  getting out of bed in the AM, bright lights                             [facility] Adult PT Treatment/Exercise - 06/08/20 0001      Self-Care   Self-Care Posture    Posture efficient siting posture.      Exercises   Exercises Neck      Neck Exercises: Supine   Neck Retraction 10  reps;3 secs   while laying on tennis balls     Manual Therapy   Manual Therapy Other (comment)    Manual therapy comments sub-occipital release,    Other Manual Therapy R upper trap inhibition taping      Neck Exercises: Stretches   Upper Trapezius Stretch 1 rep;Right;30 seconds    Levator Stretch Right;1 rep;30 seconds                  PT Education - 06/08/20 0917    Education Details Reviewed posture and self trigger point release techniques    Person(s) Educated Patient    Methods Explanation;Verbal cues;Handout    Comprehension Verbalized understanding;Verbal cues required               PT Long Term Goals - 05/24/20 1337      PT LONG TERM GOAL #1   Title Independent with HEP    Baseline needs HEP    Time 6    Period Weeks    Status  New    Target Date 07/05/20      PT LONG TERM GOAL #2   Title Improve FOTO outcome measure score to 71% or greater functional status    Baseline 59% function    Time 6    Period Weeks    Status New    Target Date 07/05/20      PT LONG TERM GOAL #3   Title Increase bilateral cervical rotation AROM at least 5-10 deg to improve aiblity to turn head for activities such as driving    Baseline right 58 deg, left 60 deg    Time 6    Period Weeks    Status New    Target Date 07/05/20      PT LONG TERM GOAL #4   Title Decrease headache intensity by at [place] 50% from baseline status and pain from constant to intermittent to improve tolerance ADLs and lifting activities    Time 6    Period Weeks    Status New    Target Date 07/05/20                 Plan - 06/08/20 1761    Clinical Impression Statement pt rpeorts conitnued neck pain/ tension and hx of HA which she reportes occurs in the morning and worsens at the end of the day. Focus on self trigger point release techniques and how to perofrm at home to hlep bridge DN and home treatment. reviewed HEP and posture to reduce abnormal tension especially at work. tiraled  inhibition taping for the R upper trap which she noted decreased pain end of session dropping to 2/10 cmpared to 3/10 when she started treatment.    PT Treatment/Interventions ADLs/Self Care Home Management;Cryotherapy;Electrical Stimulation;Moist Heat;Therapeutic activities;Therapeutic exercise;Patient/family education;Manual techniques;Dry needling;Taping;Neuromuscular re-education;Ultrasound    PT Next Visit Plan Review HEP as needed, check response dry needling and continue as found beneficial, gentle manual, stretches, worl on postural training/add T-band exercises as tolerated for periscapular strengthening    Consulted and Agree with Plan of Care Patient           Patient will benefit from skilled therapeutic intervention in order to improve the following deficits and impairments:  Decreased activity tolerance,Impaired UE functional use,Impaired flexibility,Increased fascial restricitons,Decreased range of motion,Increased muscle spasms,Postural dysfunction,Pain  Visit Diagnosis: Cervicalgia     Problem List Patient Active Problem List   Diagnosis Date Noted  . Occipital neuralgia of right side 02/21/2020  . Cervico-occipital neuralgia of right side 02/21/2020  . Cervicalgia 01/25/2020  . Vitamin D deficiency 12/02/2019  . New daily persistent headache 11/30/2019  . Chest pain on breathing 11/30/2019  . Elevated heart rate with elevated blood pressure and diagnosis of hypertension 11/30/2019  . DOE (dyspnea on exertion) 11/30/2019  . Ulcerative colitis, unspecified 10/19/2013  . GERD (gastroesophageal reflux disease) 10/19/2013  . Rectal bleeding 06/24/2012  . Hemorrhoids 06/24/2012   Starr Lake PT, DPT, LAT, ATC  06/08/20  9:31 AM      [facility] [address] [place], [place], [address] Phone: [phone]   Fax:  [phone]  Name: Paige Hansen MRN: [id number] Date of Birth: [birth year]

## 2020-06-14 ENCOUNTER — Other Ambulatory Visit: Payer: Self-pay

## 2020-06-14 ENCOUNTER — Ambulatory Visit: Payer: 59 | Admitting: Physical Therapy

## 2020-06-14 ENCOUNTER — Encounter: Payer: Self-pay | Admitting: Physical Therapy

## 2020-06-14 DIAGNOSIS — M542 Cervicalgia: Secondary | ICD-10-CM | POA: Diagnosis not present

## 2020-06-14 NOTE — Therapy (Signed)
[place], [place], [address] Phone: [phone]   Fax:  [phone]  Physical Therapy Treatment  Patient Details  Name: Paige Hansen MRN: [id number] Date of Birth: Dec 26, 1980 Referring Provider (PT): Sarina Ill, MD   Encounter Date: 06/14/2020   PT End of Session - 06/14/20 0901    Visit Number 3    Number of Visits 12    Date for PT Re-Evaluation 07/05/20    Authorization Type [facility], recheck FOTO visit 6    PT Start Time [phone]    PT Stop Time 0930    PT Time Calculation (min) 46 min    Activity Tolerance Patient tolerated treatment well    Behavior During Therapy [facility] for tasks assessed/performed           Past Medical History:  Diagnosis Date  . Abdominal pain, epigastric 06/24/2012  . Allergy   . Anxiety   . Colitis   . Depression    Post Partum   . Diarrhea 06/24/2012  . Frequent headaches   . GERD (gastroesophageal reflux disease)   . Hemorrhoids   . Hypertension     Past Surgical History:  Procedure Laterality Date  . COLONOSCOPY    . ESSURE TUBAL LIGATION  04/2009  . none    . WISDOM TOOTH EXTRACTION     1998    There were no vitals filed for this visit.   Subjective Assessment - 06/14/20 0844    Subjective Headache frequency still the same but mild improvement in intensity from previous status.    Currently in Pain? No/denies              [facility] PT Assessment - 06/14/20 0001      AROM   Cervical - Right Rotation 58    Cervical - Left Rotation 75                         [facility] Adult PT Treatment/Exercise - 06/14/20 0001      Neck Exercises: Theraband   Rows 15 reps;Red    Horizontal ABduction 15 reps;Red    Horizontal ABduction Limitations supine      Neck Exercises: Seated   Other Seated Exercise SNAG for cervical rotation x 5 reps to right      Neck Exercises: Supine   Neck Retraction 20 reps      Manual Therapy   Manual Therapy  Joint mobilization;Manual Traction    Joint Mobilization upper cervical CPAs in supine grade I-III    Manual Traction suboccipital release and gentle manual traction      Neck Exercises: Stretches   Upper Trapezius Stretch Right;3 reps;30 seconds    Levator Stretch Right;3 reps;30 seconds            Trigger Point Dry Needling - 06/14/20 0001    Consent Given? Yes    Education Handout Provided Previously provided    Muscles Treated Head and Neck Upper trapezius;Oblique capitus;Suboccipitals;Semispinalis capitus    Dry Needling Comments needling to right side muscles as noted with 32 gauge 30 mm needles    Electrical Stimulation Performed with Dry Needling Yes    E-stim with Dry Needling Details TENS 2 pps x 10 minutes                     PT Long Term Goals - 05/24/20 1337      PT LONG TERM GOAL #1  Title Independent with HEP    Baseline needs HEP    Time 6    Period Weeks    Status New    Target Date 07/05/20      PT LONG TERM GOAL #2   Title Improve FOTO outcome measure score to 71% or greater functional status    Baseline 59% function    Time 6    Period Weeks    Status New    Target Date 07/05/20      PT LONG TERM GOAL #3   Title Increase bilateral cervical rotation AROM at least 5-10 deg to improve aiblity to turn head for activities such as driving    Baseline right 58 deg, left 60 deg    Time 6    Period Weeks    Status New    Target Date 07/05/20      PT LONG TERM GOAL #4   Title Decrease headache intensity by at [place] 50% from baseline status and pain from constant to intermittent to improve tolerance ADLs and lifting activities    Time 6    Period Weeks    Status New    Target Date 07/05/20                 Plan - 06/14/20 0935    Clinical Impression Statement Mild improvement in pain intensity for HAs as notes in subjective and pt. has made improvements in cervical rotation AROM with 15 deg gain to left but right side still limited  consistent with status at eval. Added SNAG with towel to assist. Trial estim with dry needling today so will await further tx. response. Progress with goals otherwise ongoing.    Personal Factors and Comorbidities Time since onset of injury/illness/exacerbation;Comorbidity 2    Comorbidities see PMH    Examination-Activity Limitations Lift;Reach Overhead    Examination-Participation Restrictions Cleaning;Driving    Stability/Clinical Decision Making Stable/Uncomplicated    Clinical Decision Making Low    Rehab Potential Fair    PT Frequency --   1-2x/week   PT Duration 6 weeks    PT Treatment/Interventions ADLs/Self Care Home Management;Cryotherapy;Electrical Stimulation;Moist Heat;Therapeutic activities;Therapeutic exercise;Patient/family education;Manual techniques;Dry needling;Taping;Neuromuscular re-education;Ultrasound    PT Next Visit Plan continue DN, manual, stretches, SNAGS, postural training    PT Home Exercise Plan Access code: [email or web address]    Consulted and Agree with Plan of Care Patient           Patient will benefit from skilled therapeutic intervention in order to improve the following deficits and impairments:  Decreased activity tolerance,Impaired UE functional use,Impaired flexibility,Increased fascial restricitons,Decreased range of motion,Increased muscle spasms,Postural dysfunction,Pain  Visit Diagnosis: Cervicalgia     Problem List Patient Active Problem List   Diagnosis Date Noted  . Occipital neuralgia of right side 02/21/2020  . Cervico-occipital neuralgia of right side 02/21/2020  . Cervicalgia 01/25/2020  . Vitamin D deficiency 12/02/2019  . New daily persistent headache 11/30/2019  . Chest pain on breathing 11/30/2019  . Elevated heart rate with elevated blood pressure and diagnosis of hypertension 11/30/2019  . DOE (dyspnea on exertion) 11/30/2019  . Ulcerative colitis, unspecified 10/19/2013  . GERD (gastroesophageal reflux disease) 10/19/2013  .  Rectal bleeding 06/24/2012  . Hemorrhoids 06/24/2012    Beaulah Dinning, PT, DPT 06/14/20 9:40 AM  [facility] [address] [place], [place], [address] Phone: [phone]   Fax:  [phone]  Name: Paige Hansen MRN: [id number] Date of Birth: Aug 04, 1980

## 2020-06-19 ENCOUNTER — Other Ambulatory Visit: Payer: Self-pay

## 2020-06-19 ENCOUNTER — Ambulatory Visit: Payer: 59 | Admitting: Physical Therapy

## 2020-06-19 ENCOUNTER — Encounter: Payer: Self-pay | Admitting: Physical Therapy

## 2020-06-19 DIAGNOSIS — M542 Cervicalgia: Secondary | ICD-10-CM

## 2020-06-19 NOTE — Therapy (Signed)
[place], [place], [address] Phone: [phone]   Fax:  [phone]  Physical Therapy Treatment  Patient Details  Name: Paige Hansen MRN: [id number] Date of Birth: 06/02/1980 Referring Provider (PT): Sarina Ill, MD   Encounter Date: 06/19/2020   PT End of Session - 06/19/20 0851    Visit Number 4    Number of Visits 12    Date for PT Re-Evaluation 07/05/20    Authorization Type [facility], recheck FOTO visit 6    PT Start Time [phone]    PT Stop Time 0931    PT Time Calculation (min) 42 min    Activity Tolerance Patient tolerated treatment well    Behavior During Therapy [facility] for tasks assessed/performed           Past Medical History:  Diagnosis Date  . Abdominal pain, epigastric 06/24/2012  . Allergy   . Anxiety   . Colitis   . Depression    Post Partum   . Diarrhea 06/24/2012  . Frequent headaches   . GERD (gastroesophageal reflux disease)   . Hemorrhoids   . Hypertension     Past Surgical History:  Procedure Laterality Date  . COLONOSCOPY    . ESSURE TUBAL LIGATION  04/2009  . none    . WISDOM TOOTH EXTRACTION     1998    There were no vitals filed for this visit.   Subjective Assessment - 06/19/20 0849    Subjective Headache frequency has decreased from 7x/week to 4x/week. No pain pre-tx. this AM.    Currently in Pain? No/denies                             [facility] Adult PT Treatment/Exercise - 06/19/20 0001      Neck Exercises: Theraband   Shoulder Extension 20 reps;Green    Rows 20 reps;Green    Horizontal ABduction 20 reps;Green    Horizontal ABduction Limitations supine      Neck Exercises: Supine   Neck Retraction 20 reps      Manual Therapy   Manual Traction suboccipital release and gentle manual traction      Neck Exercises: Stretches   Upper Trapezius Stretch Right;3 reps;30 seconds   supine manual stretch   Levator Stretch Right;3 reps;30  seconds   supine manual stretch   Other Neck Stretches supine manual pec minor stretch 3x30 sec            Trigger Point Dry Needling - 06/19/20 0001    Consent Given? Yes    Education Handout Provided Previously provided    Muscles Treated Head and Neck Upper trapezius;Oblique capitus;Suboccipitals;Semispinalis capitus    Dry Needling Comments muscles as noted needled on right side only, 32 gauge 50 mm needle used for semispinalis otherwise 30-32 gauge 30 mm needles used    Electrical Stimulation Performed with Dry Needling Yes    E-stim with Dry Needling Details TENS 2 pps x 10 minutes                PT Education - 06/19/20 0926    Education Details HEP updates    Person(s) Educated Patient    Methods Explanation;Handout;Demonstration;Verbal cues    Comprehension Verbalized understanding               PT Long Term Goals - 05/24/20 1337      PT LONG TERM GOAL #1   Title  Independent with HEP    Baseline needs HEP    Time 6    Period Weeks    Status New    Target Date 07/05/20      PT LONG TERM GOAL #2   Title Improve FOTO outcome measure score to 71% or greater functional status    Baseline 59% function    Time 6    Period Weeks    Status New    Target Date 07/05/20      PT LONG TERM GOAL #3   Title Increase bilateral cervical rotation AROM at least 5-10 deg to improve aiblity to turn head for activities such as driving    Baseline right 58 deg, left 60 deg    Time 6    Period Weeks    Status New    Target Date 07/05/20      PT LONG TERM GOAL #4   Title Decrease headache intensity by at [place] 50% from baseline status and pain from constant to intermittent to improve tolerance ADLs and lifting activities    Time 6    Period Weeks    Status New    Target Date 07/05/20                 Plan - 06/19/20 0927    Clinical Impression Statement Pt. showing progress with decreased HA frequency from previous status but still having HA 4x/week.  Continuedd previous tx. emphasis given benefit noted and updated HEP with Theraband postural strengthening with good tolerance. Plan continued PT for further progress to decrease HA frequency and intensity.    Personal Factors and Comorbidities Time since onset of injury/illness/exacerbation;Comorbidity 2    Comorbidities see PMH    Examination-Activity Limitations Lift;Reach Overhead    Examination-Participation Restrictions Cleaning;Driving    Stability/Clinical Decision Making Stable/Uncomplicated    Clinical Decision Making Low    Rehab Potential Fair    PT Frequency --   1-2x/week   PT Duration 6 weeks    PT Treatment/Interventions ADLs/Self Care Home Management;Cryotherapy;Electrical Stimulation;Moist Heat;Therapeutic activities;Therapeutic exercise;Patient/family education;Manual techniques;Dry needling;Taping;Neuromuscular re-education;Ultrasound    PT Next Visit Plan continue DN, manual, stretches, SNAGS, postural training    PT Home Exercise Plan Access code: [email or web address]    Consulted and Agree with Plan of Care Patient           Patient will benefit from skilled therapeutic intervention in order to improve the following deficits and impairments:  Decreased activity tolerance,Impaired UE functional use,Impaired flexibility,Increased fascial restricitons,Decreased range of motion,Increased muscle spasms,Postural dysfunction,Pain  Visit Diagnosis: Cervicalgia     Problem List Patient Active Problem List   Diagnosis Date Noted  . Occipital neuralgia of right side 02/21/2020  . Cervico-occipital neuralgia of right side 02/21/2020  . Cervicalgia 01/25/2020  . Vitamin D deficiency 12/02/2019  . New daily persistent headache 11/30/2019  . Chest pain on breathing 11/30/2019  . Elevated heart rate with elevated blood pressure and diagnosis of hypertension 11/30/2019  . DOE (dyspnea on exertion) 11/30/2019  . Ulcerative colitis, unspecified 10/19/2013  . GERD (gastroesophageal  reflux disease) 10/19/2013  . Rectal bleeding 06/24/2012  . Hemorrhoids 06/24/2012    Beaulah Dinning, PT, DPT 06/19/20 9:29 AM  [facility] [address] [place], [place], [address] Phone: [phone]   Fax:  [phone]  Name: Paige Hansen MRN: [id number] Date of Birth: [birth year]

## 2020-06-26 ENCOUNTER — Other Ambulatory Visit: Payer: Self-pay

## 2020-06-26 ENCOUNTER — Ambulatory Visit (HOSPITAL_BASED_OUTPATIENT_CLINIC_OR_DEPARTMENT_OTHER): Payer: 59 | Admitting: Nurse Practitioner

## 2020-06-26 ENCOUNTER — Encounter (HOSPITAL_BASED_OUTPATIENT_CLINIC_OR_DEPARTMENT_OTHER): Payer: Self-pay | Admitting: Nurse Practitioner

## 2020-06-26 VITALS — BP 131/91 | HR 90 | Ht 62.0 in | Wt 208.2 lb

## 2020-06-26 DIAGNOSIS — Z7689 Persons encountering health services in other specified circumstances: Secondary | ICD-10-CM | POA: Diagnosis not present

## 2020-06-26 DIAGNOSIS — R0609 Other forms of dyspnea: Secondary | ICD-10-CM | POA: Diagnosis not present

## 2020-06-26 DIAGNOSIS — G4452 New daily persistent headache (NDPH): Secondary | ICD-10-CM | POA: Diagnosis not present

## 2020-06-26 DIAGNOSIS — E559 Vitamin D deficiency, unspecified: Secondary | ICD-10-CM | POA: Diagnosis not present

## 2020-06-26 DIAGNOSIS — I10 Essential (primary) hypertension: Secondary | ICD-10-CM

## 2020-06-26 DIAGNOSIS — R009 Unspecified abnormalities of heart beat: Secondary | ICD-10-CM | POA: Diagnosis not present

## 2020-06-26 DIAGNOSIS — H5203 Hypermetropia, bilateral: Secondary | ICD-10-CM | POA: Diagnosis not present

## 2020-06-26 DIAGNOSIS — K5180 Other ulcerative colitis without complications: Secondary | ICD-10-CM | POA: Diagnosis not present

## 2020-06-26 DIAGNOSIS — U099 Post covid-19 condition, unspecified: Secondary | ICD-10-CM

## 2020-06-26 DIAGNOSIS — R635 Abnormal weight gain: Secondary | ICD-10-CM | POA: Insufficient documentation

## 2020-06-26 DIAGNOSIS — E538 Deficiency of other specified B group vitamins: Secondary | ICD-10-CM | POA: Diagnosis not present

## 2020-06-26 MED ORDER — SEMAGLUTIDE-WEIGHT MANAGEMENT 0.25 MG/0.5ML ~~LOC~~ SOAJ
0.2500 mg | SUBCUTANEOUS | 0 refills | Status: DC
  Filled 2020-06-26: qty 2, 28d supply, fill #0

## 2020-06-26 MED ORDER — BREO ELLIPTA 100-25 MCG/INH IN AEPB: 1.0000 | INHALATION_SPRAY | Freq: Every day | RESPIRATORY_TRACT | 0 refills | Status: DC

## 2020-06-26 MED ORDER — SEMAGLUTIDE-WEIGHT MANAGEMENT 2.4 MG/0.75ML ~~LOC~~ SOAJ
2.4000 mg | SUBCUTANEOUS | 2 refills | Status: DC
  Filled 2020-06-26: qty 3, fill #0

## 2020-06-26 MED ORDER — METOPROLOL SUCCINATE ER 25 MG PO TB24
50.0000 mg | ORAL_TABLET | Freq: Every day | ORAL | 11 refills | Status: DC
  Filled 2020-06-26: qty 60, 30d supply, fill #0

## 2020-06-26 MED ORDER — SEMAGLUTIDE-WEIGHT MANAGEMENT 1 MG/0.5ML ~~LOC~~ SOAJ
1.0000 mg | SUBCUTANEOUS | 0 refills | Status: DC
  Filled 2020-06-26: qty 2, 28d supply, fill #0

## 2020-06-26 MED ORDER — ONDANSETRON HCL 4 MG PO TABS
4.0000 mg | ORAL_TABLET | Freq: Three times a day (TID) | ORAL | 3 refills | Status: DC | PRN
  Filled 2020-06-26: qty 60, 20d supply, fill #0

## 2020-06-26 MED ORDER — SEMAGLUTIDE-WEIGHT MANAGEMENT 0.5 MG/0.5ML ~~LOC~~ SOAJ
0.5000 mg | SUBCUTANEOUS | 0 refills | Status: DC
  Filled 2020-06-26: qty 2, 28d supply, fill #0

## 2020-06-26 MED ORDER — SEMAGLUTIDE-WEIGHT MANAGEMENT 1.7 MG/0.75ML ~~LOC~~ SOAJ
1.7000 mg | SUBCUTANEOUS | 0 refills | Status: DC
  Filled 2020-06-26: qty 3, 28d supply, fill #0

## 2020-06-26 NOTE — Progress Notes (Signed)
New Patient Office Visit  Subjective:  Patient ID: Paige Hansen, female    DOB: [birth year]  Age: 40 y.o. MRN: [id number]  CC:  Chief Complaint  Patient presents with  . Establish Care  . Hypertension    HPI Paige Hansen is a pleasant 40 year old female known to this provider presenting today to establish care with this practice. She has previously received primary care services from Dawson Bills, NP, but she is no longer in practice at her previous location.   She is also established with: Dr. Jaynee Eagles and Debbora Presto, NP with neurology Beaulah Dinning, with PT Dr. Kate Sable with cardiology Dr. Camillo Flaming with dermatology Dr. Molli Posey with OB GYN Dr. [place] Cellar with GI  She presents with concerns today with chronic headache/occipital neuralgia, weight gain, B12 deficiency, hypertension and tachycardia, and shortness of breath with exertion and prolonged fatigue post COVID.   CHRONIC HEADACHE/OCCIPITAL NEURALGIA He reports that shortly after receiving her first COVID vaccine she developed and occipitally located headache on the same side as the injection in her deltoid. She reports the pain and frequency worsening after receiving her second vaccine and has been fairly consistent since that time (12/07/2019). She is currently seeing neurology for this and has had extensive work-up performed with no conclusive findings. She reports that she did have a nerve block placed, which was effective for a short period of time, but is no longer effective. She was told that an ablation may be an option, but she is wanting to avoid this if at all possible and is currently in treatment with dry needling.  She states that the dry needling does seem to be somewhat effective, however, her headaches are severe the night of and day after the procedure and then begin to lighten. She tells me that she can now go 2-3 days headache free, where they were occurring daily.  She is  taking Gabapentin on occasion for this pain as well as flexeril (not together), but both cause significant drowsiness therefore she is not able to take these on days that she has to work. She reports that ibuprofen can be helpful to relieve the symptoms, but the headache returns before time to take another dose of medication. She expresses frustration over the chronic problem.  Neurology suggested a sleep study that may be contributing to the headaches and she is scheduled to have this evaluation on May 12. She is optimistic that this will resolve.   WEIGHT GAIN She endorses a 20 pound weight gain in the past year despite changes to monitoring her diet and increasing her physical activity. She states that she began to gain weight after being sick with COVID and she has been unable to get the weight off. She endorses prolonged COVID symptoms that severely limit her ability to be too physically active and this is frustrating for her. She has tried contrave and qsymia in the past for weight loss, but does not wish to try these medications at this time. She is interested in something that will help boost her metabolism and decrease her appetite to help aid in her weight loss goals. She is hopeful that weight loss will also improve her symptoms of fatigue and decreased strength and endurance as well as breathing difficulties.   B12 DEFICIENCY She reports a history of vitamin B12 deficiency with oral supplementation. At her last evaluation with her PCP her levels had returned to low normal, but she is concerned that these levels  have dropped as she is experiencing severe fatigue and weakness. She denies any numbness or tingling of the extremities, but reports that these were not present when her B12 was low in the past. She is interested in possible B12 injections if it will help restore her levels.   HYPERTENSION AND TACHYCARDIA She reports that following her COVID vaccine she was found to have elevated blood  pressures in the 150's-160's/90's. This was a significant change from her normal values in the 110's/60's prior to [place]. She was also found to have tachycardia without abnormality in rhythm. She has had a workup with cardiology and no concerning findings have been identified. She reports that she is taking the metoprolol 56m per day and she does feel that this has improved her blood pressures and heart rate, but they are still elevated. She endorses home readings in the 140's/80's consistently. She does have headaches, but these are not thought to be related to BP. She denies dizziness, but does endorse vision changes associated with her headaches. Imaging studies have been negative for concerning findings.   POST COVID SYMPTOMS She endorses multiple concerns after having COVID in [place] 2020 and then again in 2021. 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 Her first battle with COVID was not tested, but she was later found to have had the virus. She reports the first illness was severe and took her more than a month to physically recover. She states that she still is experiencing extreme fatigue, muscle weakness, difficulty pulling and pushing, and shortness of breath with minimal exertion. She states that it feels as though the air is not moving in her lungs. This is frustrating for her as she has been very active and her job requires significant physical labor, which causes increased exhaustion.  She has had a chest x-ray which was negative for any concerning findings in the past. She has not had any testing for asthma or COPD. She denies increased mucous production or productive cough. She does state that she will have coughing spells, which are dry. She has not had any fevers or chills.    Past Medical History:  Diagnosis Date  . Abdominal pain, epigastric 06/24/2012  . Allergy   . Anxiety   . Colitis   . Depression     Post Partum   . Diarrhea 06/24/2012  . Frequent headaches   . GERD (gastroesophageal reflux disease)   . Hemorrhoids   . Hypertension     Past Surgical History:  Procedure Laterality Date  . COLONOSCOPY    . ESSURE TUBAL LIGATION  04/2009  . none    . WISDOM TOOTH EXTRACTION     1998    Family History  Problem Relation Age of Onset  . Breast cancer Maternal Grandmother   . Arthritis Maternal Grandmother   . Cancer Maternal Grandmother   . Hyperlipidemia Maternal Grandmother   . Hypertension Maternal Grandmother   . Kidney disease Maternal Grandmother   . Diabetes Mother   . Hyperlipidemia Mother   . Hypertension Mother   . Alcohol abuse Father   . Alcohol abuse Brother   . Asthma Brother   . Asthma Daughter   . Intellectual disability Daughter   . Arthritis Maternal Grandfather   . Cancer Maternal Grandfather   . Diabetes Maternal Grandfather   . Hearing loss Maternal Grandfather   . Heart disease Maternal Grandfather   .  Hyperlipidemia Maternal Grandfather   . Hypertension Maternal Grandfather   . Kidney disease Maternal Grandfather   . Heart attack Maternal Grandfather   . Diabetes Paternal Grandmother   . Heart disease Paternal Grandmother   . Hypertension Paternal Grandmother   . Colon cancer Neg Hx   . Esophageal cancer Neg Hx   . Rectal cancer Neg Hx   . Stomach cancer Neg Hx   . Colon polyps Neg Hx   . Headache Neg Hx   . Migraines Neg Hx     Social History   Socioeconomic History  . Marital status: Married    Spouse name: Not on file  . Number of children: 2  . Years of education: Not on file  . Highest education level: Not on file  Occupational History  . Occupation: [employer]    Employer: [employer]  Tobacco Use  . Smoking status: Never Smoker  . Smokeless tobacco: Never Used  Vaping Use  . Vaping Use: Never used  Substance and Sexual Activity  . Alcohol use: No  . Drug use: No  . Sexual activity: Yes    Birth control/protection:  Other-see comments    Comment: essure implant   Other Topics Concern  . Not on file  Social History Narrative   Lives at home with husband and children   Right handed   Caffeine: 2 cups/day   Social Determinants of Health   Financial Resource Strain: Not on file  Food Insecurity: Not on file  Transportation Needs: Not on file  Physical Activity: Not on file  Stress: Not on file  Social Connections: Not on file  Intimate Partner Violence: Not on file    ROS Review of Systems  Constitutional: Positive for activity change, fatigue and unexpected weight change.  HENT: Negative for rhinorrhea, sinus pressure, sinus pain, sneezing, sore throat, tinnitus, trouble swallowing and voice change.        Increased sensitivity to sounds with headache  Eyes: Positive for photophobia and visual disturbance.       With headaches  Respiratory: Positive for cough, chest tightness, shortness of breath and wheezing.   Cardiovascular: Positive for chest pain. Negative for palpitations and leg swelling.  Gastrointestinal: Negative for abdominal pain, constipation, diarrhea, nausea and vomiting.  Endocrine: Negative for cold intolerance, heat intolerance, polydipsia, polyphagia and polyuria.  Genitourinary: Negative for dysuria, frequency, menstrual problem, urgency and vaginal discharge.  Musculoskeletal: Positive for neck stiffness. Negative for arthralgias and joint swelling.  Skin: Negative for color change, pallor, rash and wound.  Neurological: Positive for dizziness, weakness and headaches. Negative for facial asymmetry, speech difficulty and numbness.  Hematological: Negative for adenopathy. Does not bruise/bleed easily.  Psychiatric/Behavioral: Positive for sleep disturbance. Negative for agitation, confusion, decreased concentration, dysphoric mood and suicidal ideas. The patient is not nervous/anxious.     Objective:   Today's Vitals: BP (!) 131/91   Pulse 90   Ht 5' 2"  (1.575 m)   Wt  208 lb 3.2 oz (94.4 kg)   SpO2 99%   BMI 38.08 kg/m   Physical Exam Vitals and nursing note reviewed.  Constitutional:      General: She is not in acute distress.    Appearance: Normal appearance. She is normal weight.  HENT:     Head: Normocephalic and atraumatic.     Right Ear: Tympanic membrane, ear canal and external ear normal.     Left Ear: Tympanic membrane, ear canal and external ear normal.     Nose: Nose  normal. No congestion.     Mouth/Throat:     Mouth: Mucous membranes are moist.     Pharynx: Oropharynx is clear. No oropharyngeal exudate or posterior oropharyngeal erythema.  Eyes:     Extraocular Movements: Extraocular movements intact.     Conjunctiva/sclera: Conjunctivae normal.     Pupils: Pupils are equal, round, and reactive to light.  Neck:     Vascular: No carotid bruit.  Cardiovascular:     Rate and Rhythm: Normal rate and regular rhythm.     Pulses: Normal pulses.     Heart sounds: Normal heart sounds. No murmur heard. No friction rub. No gallop.   Pulmonary:     Effort: Pulmonary effort is normal. No respiratory distress.     Breath sounds: Decreased air movement present. Examination of the left-middle field reveals decreased breath sounds. Examination of the right-lower field reveals decreased breath sounds. Examination of the left-lower field reveals decreased breath sounds. Decreased breath sounds present. No wheezing.     Comments: Decreased breath sounds noted on the left>right in mid and lower lobes of the lungs. No retraction or oxygenation deficits noted.  Chest:     Chest wall: No tenderness.  Abdominal:     General: Abdomen is flat. Bowel sounds are normal. There is no distension.     Palpations: Abdomen is soft.     Tenderness: There is no abdominal tenderness. There is no right CVA tenderness, left CVA tenderness, guarding or rebound.  Musculoskeletal:        General: Normal range of motion.     Cervical back: Normal range of motion. No  rigidity or tenderness.     Right lower leg: No edema.     Left lower leg: No edema.  Lymphadenopathy:     Cervical: No cervical adenopathy.  Skin:    General: Skin is warm and dry.     Capillary Refill: Capillary refill takes less than 2 seconds.  Neurological:     General: No focal deficit present.     Mental Status: She is alert and oriented to person, place, and time.     Cranial Nerves: No cranial nerve deficit.     Sensory: No sensory deficit.     Motor: Weakness present.     Coordination: Coordination normal.     Gait: Gait normal.     Comments: Generalized mild weakness to bilateral upper and lower extremities noted against opposition with no deficits determined.   Psychiatric:        Mood and Affect: Mood normal.        Behavior: Behavior normal.        Thought Content: Thought content normal.        Judgment: Judgment normal.     Assessment & Plan:   Problem List Items Addressed This Visit      Cardiovascular and Mediastinum   Elevated heart rate with elevated blood pressure and diagnosis of hypertension    Tachycardia with RRR in the setting of sudden onset of hypertension post COVID-19 vaccine.  Currently on metoprolol 5m daily. Plan to increase to Metoprolol 539mper day with monitoring of HR and BP with goal of BP <130/80 and HR <90. Patient to report elevated readings or low readings for further evaluation.  Cardiology following.  Will check TSH today to rule out thyroid dysfunction. No signs of dehydration present. No warning signs present today.  Plan to follow-up in 4-6 weeks.       Relevant Medications  metoprolol succinate (TOPROL-XL) 25 MG 24 hr tablet   Primary hypertension    Elevated blood pressure after COVID-19 vaccine and continuing. Current treatment of metoprolol 42m per day with no adverse effects.  Appears that metoprolol was started given tachycardia present in addition to hypertension. Patient remains higher than goal in HR and BP here  and on home measurements.  Headaches appear to be independent of BP, however, will monitor for changes that may affect her symptoms.  Will plan to increase metoprolol to 569mper day with goal BP of less than 130/80 and goal HR of less than 90 on average.  Recommend monitoring BP at home and notifying me of readings consistently higher than goal.  May consider adding HCTZ if BP remains elevated. Metoprolol will likely need to continue due to HR. Cardiology is following and will coordinate care with them.  F/U in 4-6 weeks       Relevant Medications   metoprolol succinate (TOPROL-XL) 25 MG 24 hr tablet     Other   New daily persistent headache    Occipital neuralgia of the right side with continued nearly daily headache despite nerve block, daily gabapentin, dry needling procedures, and flexeril. Symptoms are reportedly somewhat improving, but resolution has not occurred.  Recommend continued medications and treatment at this time. Headaches appear to be independent of blood pressure elevation and imaging studies have been negative.  Followed by neurology with sleep study scheduled in May for possible OSA component.  Follow-up with [facility] [facility] if new or worsening symptoms develop. Seek emergency evaluation if emergency symptoms such as slurred speech, weakness, confusion, or dizziness present.       Relevant Medications   metoprolol succinate (TOPROL-XL) 25 MG 24 hr tablet   Other Relevant Orders   Thyroid Panel With TSH   B12 and Folate Panel   Vitamin D deficiency    History of vitamin d deficiency with recent levels of 16.86 in 11/2019. She is currently on supplementation daily. No changes to plan of care today, but plan to monitor labs again at CPE in September for evaluation of effectiveness of medication.       Relevant Orders   Thyroid Panel With TSH   Encounter to establish care - Primary    New patient to practice to establish care.  Medical records reviewed from previous  PCP and speciality offices.   Review of current and past medical history, surgical history, medications, family history, and SDOH completed today. Medication review completed with recommendations.   Health maintenance reviewed and will be updated once previous records received.  Annual Exam due in September- labs not scheduled today.  Follow-up sooner if needed.        B12 deficiency    History of B12 deficiency currently on oral therapy. Symptoms of fatigue and brain fog are present, therefore re-evaluation of levels appropriate today.  Discussed with patient if levels found to be below normal or on the low side of normal, we will consider injectable replacement to help improve medication absorption and symptom reduction.  She is agreeable to this plan.  Will follow-up when labs are available and make changes to the plan of care as necessary.       Relevant Orders   B12 and Folate Panel   Weight gain    20 pound weight gain in past 12 months without change with diet and activity.  Physical activity currently limited due to respiratory complications post [id number] x2 in the past two  years.  Discussed weight management options including injectable semaglutide weekly treatments. She is agreeable to this plan.  Prescription sent to pharmacy. Current BMI 38.08.  In the setting of HTN weight loss would be beneficial for her overall health and reduction of disease burden.  No family history of medullary thyroid tumor or thyroid carcinoma.  Zofran provided her intermittent nausea associated with dosing of medication.  Plan to follow-up in 4-6 weeks for weight check      Relevant Medications   Semaglutide-Weight Management 0.25 MG/0.5ML SOAJ   Semaglutide-Weight Management 0.5 MG/0.5ML SOAJ (Start on 07/25/2020)   Semaglutide-Weight Management 1 MG/0.5ML SOAJ (Start on 08/23/2020)   Semaglutide-Weight Management 1.7 MG/0.75ML SOAJ (Start on 09/21/2020)   Semaglutide-Weight Management  2.4 MG/0.75ML SOAJ (Start on 10/20/2020)   ondansetron (ZOFRAN) 4 MG tablet   Post-COVID chronic dyspnea    Chronic dyspnea present post COVID-19 illness x2 in the past 2 years with decreased breath sounds noted on auscultation today. No wheezing or distress noted. O2 saturations normal at 99%. Previous CXR negative for any concerning findings.  Discussed with patient that she may benefit from inhaler to help with symptoms while she continues to recover from dual infection. Unfortunately with limited knowledge of the trajectory of COVID-19 and post infection statuses, there is not a clear answer to treatment at this time.  Due to elevation in HR will avoid SABA at this time to prevent worsening tachycardia.  Sample of Breo Ellipta provided to patient for trial use to see if symptoms of DOE improve. Instructions provided and patient educated on use of medications.  Monitor for symptoms of increased heart rate and report any symptoms.  If medication is effective, will continue treatment. If not effective, will consider pulmonology referral for formal PFT.       Relevant Medications   fluticasone furoate-vilanterol (BREO ELLIPTA) 100-25 MCG/INH AEPB    Other Visit Diagnoses    Other ulcerative colitis without complication ([place])   (Chronic)        Outpatient Encounter Medications as of 06/26/2020  Medication Sig  . Cholecalciferol 25 MCG (1000 UT) capsule Take 1 capsule (1,000 Units total) by mouth daily.  . cyclobenzaprine (FLEXERIL) 10 MG tablet TAKE 1 TABLET BY MOUTH AS NEEDED.  . fluticasone furoate-vilanterol (BREO ELLIPTA) 100-25 MCG/INH AEPB Inhale 1 puff into the lungs daily. Sample Provided.  . gabapentin (NEURONTIN) 100 MG capsule TAKE 1 CAPSULE BY MOUTH 3 (THREE) TIMES DAILY.  Marland Kitchen ondansetron (ZOFRAN) 4 MG tablet Take 1 tablet (4 mg total) by mouth every 8 (eight) hours as needed for nausea or vomiting.  . Semaglutide-Weight Management 0.25 MG/0.5ML SOAJ Inject 0.25 mg into the skin once  a week for 28 days.  Derrill Memo ON 07/25/2020] Semaglutide-Weight Management 0.5 MG/0.5ML SOAJ Inject 0.5 mg into the skin once a week for 28 days.  Derrill Memo ON 08/23/2020] Semaglutide-Weight Management 1 MG/0.5ML SOAJ Inject 1 mg into the skin once a week for 28 days.  Derrill Memo ON 09/21/2020] Semaglutide-Weight Management 1.7 MG/0.75ML SOAJ Inject 1.7 mg into the skin once a week for 28 days.  Derrill Memo ON 10/20/2020] Semaglutide-Weight Management 2.4 MG/0.75ML SOAJ Inject 2.4 mg into the skin once a week for 28 days.  . vitamin B-12 (CYANOCOBALAMIN) 500 MCG tablet Take 500 mcg by mouth daily.  . [DISCONTINUED] cyclobenzaprine (FLEXERIL) 5 MG tablet TAKE 1 TABLET (5 MG TOTAL) BY MOUTH 3 (THREE) TIMES DAILY AS NEEDED FOR UP TO 14 DAYS FOR MUSCLE SPASMS.  . [DISCONTINUED] metoprolol  succinate (TOPROL-XL) 25 MG 24 hr tablet TAKE 1 TABLET BY MOUTH ONCE DAILY  . [DISCONTINUED] Phentermine-Topiramate (QSYMIA) 11.25-69 MG CP24 Qsymia 11.25 mg-69 mg capsule, extended release  Take 1 capsule every day by oral route.  . metoprolol succinate (TOPROL-XL) 25 MG 24 hr tablet Take 2 tablets (50 mg total) by mouth daily.  . [DISCONTINUED] fluticasone (FLONASE) 50 MCG/ACT nasal spray Place 2 sprays into both nostrils daily.  . [DISCONTINUED] metoprolol tartrate (LOPRESSOR) 100 MG tablet Take 1 tablet (100 mg) by mouth 2 hours prior to your Cardiac CTA   No facility-administered encounter medications on file as of 06/26/2020.    Follow-up: Return in about 4 weeks (around 07/24/2020) for Virtual F/U medication and BP.   Time: 75 minutes, >50% spent counseling, care coordination, chart review, and documentation.    Orma Render, NP

## 2020-06-26 NOTE — Assessment & Plan Note (Addendum)
Tachycardia with RRR in the setting of sudden onset of hypertension post COVID-19 vaccine.  Currently on metoprolol 80m daily. Plan to increase to Metoprolol 521mper day with monitoring of HR and BP with goal of BP <130/80 and HR <90. Patient to report elevated readings or low readings for further evaluation.  Cardiology following.  Will check TSH today to rule out thyroid dysfunction. No signs of dehydration present. No warning signs present today.  Plan to follow-up in 4-6 weeks.

## 2020-06-26 NOTE — Assessment & Plan Note (Addendum)
Chronic dyspnea present post COVID-19 illness x2 in the past 2 years with decreased breath sounds noted on auscultation today. No wheezing or distress noted. O2 saturations normal at 99%. Previous CXR negative for any concerning findings.  Discussed with patient that she may benefit from inhaler to help with symptoms while she continues to recover from dual infection. Unfortunately with limited knowledge of the trajectory of COVID-19 and post infection statuses, there is not a clear answer to treatment at this time.  Due to elevation in HR will avoid SABA at this time to prevent worsening tachycardia.  Sample of Breo Ellipta provided to patient for trial use to see if symptoms of DOE improve. Instructions provided and patient educated on use of medications.  Monitor for symptoms of increased heart rate and report any symptoms.  If medication is effective, will continue treatment. If not effective, will consider pulmonology referral for formal PFT.

## 2020-06-26 NOTE — Assessment & Plan Note (Signed)
New patient to practice to establish care.  Medical records reviewed from previous PCP and speciality offices.   Review of current and past medical history, surgical history, medications, family history, and SDOH completed today. Medication review completed with recommendations.   Health maintenance reviewed and will be updated once previous records received.  Annual Exam due in September- labs not scheduled today.  Follow-up sooner if needed.

## 2020-06-26 NOTE — Assessment & Plan Note (Signed)
Occipital neuralgia of the right side with continued nearly daily headache despite nerve block, daily gabapentin, dry needling procedures, and flexeril. Symptoms are reportedly somewhat improving, but resolution has not occurred.  Recommend continued medications and treatment at this time. Headaches appear to be independent of blood pressure elevation and imaging studies have been negative.  Followed by neurology with sleep study scheduled in May for possible OSA component.  Follow-up with [place] or neurology if new or worsening symptoms develop. Seek emergency evaluation if emergency symptoms such as slurred speech, weakness, confusion, or dizziness present.

## 2020-06-26 NOTE — Assessment & Plan Note (Addendum)
20 pound weight gain in past 12 months without change with diet and activity.  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  Discussed weight management options including injectable semaglutide weekly treatments. She is agreeable to this plan.  Prescription sent to pharmacy. Current BMI 38.08.  In the setting of HTN weight loss would be beneficial for her overall health and reduction of disease burden.  No family history of medullary thyroid tumor or thyroid carcinoma.  Zofran provided her intermittent nausea associated with dosing of medication.  Plan to follow-up in 4-6 weeks for weight check

## 2020-06-26 NOTE — Assessment & Plan Note (Signed)
History of vitamin d deficiency with recent levels of 16.86 in 11/2019. She is currently on supplementation daily. No changes to plan of care today, but plan to monitor labs again at [facility] in September for evaluation of effectiveness of medication.

## 2020-06-26 NOTE — Assessment & Plan Note (Signed)
History of B12 deficiency currently on oral therapy. Symptoms of fatigue and brain fog are present, therefore re-evaluation of levels appropriate today.  Discussed with patient if levels found to be below normal or on the low side of normal, we will consider injectable replacement to help improve medication absorption and symptom reduction.  She is agreeable to this plan.  Will follow-up when labs are available and make changes to the plan of care as necessary.

## 2020-06-26 NOTE — Assessment & Plan Note (Addendum)
Elevated blood pressure after COVID-19 vaccine and continuing. Current treatment of metoprolol 8m per day with no adverse effects.  Appears that metoprolol was started given tachycardia present in addition to hypertension. Patient remains higher than goal in HR and BP here and on home measurements.  Headaches appear to be independent of BP, however, will monitor for changes that may affect her symptoms.  Will plan to increase metoprolol to 569mper day with goal BP of less than 130/80 and goal HR of less than 90 on average.  Recommend monitoring BP at home and notifying me of readings consistently higher than goal.  May consider adding HCTZ if BP remains elevated. Metoprolol will likely need to continue due to HR. Cardiology is following and will coordinate care with them.  F/U in 4-6 weeks

## 2020-06-26 NOTE — Patient Instructions (Addendum)
Recommendations from today's visit:  We have changed your metoprolol from 70m to 526monce a day for blood pressure and heart rate. I sent an update prescription to the pharmacy for you.  Monitor your blood pressure at home and report readings consistently over 130/80. We would ideally like to have your systolic (top number) blood pressure below 12829nd your diastolic (bottom number) blood pressure below 80.  If you are not meeting this goal, we will evaluate if we need to make medication changes.   We will check your thyroid and B12 levels today and I will let you know if we need to make any changes.   I have sent a referral for GI for colonoscopy. They will call you to schedule.   I have given you a sample of Breo Ellipta inhaler which is a steroid and long acting bronchodilator. You will take one inhalation of this once a day every day. If this helps with your breathing, please let me know and we can work to get you more.   [facility] with the increased doses for the next 6 months. This may take a few days to get prior authorization, but the pharmacy will contact you when it is ready. I have also sent in for Zofran. Let me know if you start having any issues or concerns.   You will be due for you annual physical exam after September 22 of this year. You can schedule this when you leave today, if you would like. We will plan to get fasting labs at that time which can be done on the same day or a few days prior to the appointment. If you have labs done on the same day, we will likely not have results to discuss. _________________________________________________________________________________________ [facility] you [facility]. I am excited for the opportunity to partner with you to meet your health care goals. It was a pleasure meeting you today!  I am an Adult-Geriatric Nurse Practitioner with a  background in caring for patients for more than 20 years. I received my BaPaediatric nursen Nursing and my Doctor of Nursing Practice degrees at [employer] received additional fellowship training in primary care and sports medicine after receiving my doctorate degree. I provide primary care and sports medicine services to patients age 4161nd older within this office. I am also a provider with the [facility] the director of the APP Fellowship [facility] [place], but have called the [place] for nearly 20 years and am proud to be a member of this community.   I am passionate about providing the best service to you through preventive medicine and supportive care. I consider you a part of the medical team and value your input. I work diligently to ensure that you are heard and your needs are met in a safe and effective manner. I want you to feel comfortable with me as your provider and want you to know that your health concerns are important to me.   For your information, our office hours are Monday- Friday 8:00 AM - 5:00 PM At this time I am not in the office on Wednesdays.  If you have questions or concerns, please call our office at [phone] send [place] MyChart message and we will respond as quickly as possible.   For  all urgent or time sensitive needs we ask that you please call the office to avoid delays. MyChart is not constantly monitored and replies may take up to 72 business hours.  MyChart Policy: . MyChart allows for you to see your visit notes, after visit summary, provider recommendations, lab and tests results, make an appointment, request refills, and contact your provider or the office for non-urgent questions or concerns.  . Providers are seeing patients during normal business hours and do not have built in time to review MyChart messages. We ask that you allow a minimum of 72 business hours for  MyChart message responses.  . Complex MyChart concerns may require a visit. Your provider may request you schedule a virtual or in person visit to ensure we are providing the best care possible. . MyChart messages sent after 4:00 PM on Friday will not be received by the provider until Monday morning.    Lab and Test Results: . You will receive your lab and test results on MyChart as soon as they are completed and results have been sent by the lab or testing facility. Due to this service, you will receive your results BEFORE your provider.  . Please allow a minimum of 72 business hours for your provider to receive and review lab and test results and contact you about.   . Most lab and test result comments from the provider will be sent through [place]. Your provider may recommend changes to the plan of care, follow-up visits, repeat testing, ask questions, or request an office visit to discuss these results. You may reply directly to this message or call the office at [phone] to provide information for the provider or set up an appointment. . In some instances, you will be called with test results and recommendations. Please let us know if this is preferred and we will make note of this in your chart to provide this for you.    . If you have not heard a response to your lab or test results in 72 business hours, please call the office to let us know.   After Hours: . For all non-emergency after hours needs, please call the office at [phone] and select the option to reach the on-call provider service. On-call services are shared between multiple [place] offices and therefore it will not be possible to speak directly with your provider. On-call providers may provide medical advice and recommendations, but are unable to provide refills for maintenance medications.  . For all emergency or urgent medical needs after normal business hours, we recommend that you seek care at the closest Urgent Care  or Emergency Department to ensure appropriate treatment in a timely manner.  [facility] has a 24 hour emergency room located on the ground floor for your convenience.    Please do not hesitate to reach out to [place] with concerns.   Thank you, again, for choosing me as your health care partner. I appreciate your trust and look forward to learning more about you.   SaraBeth Zakry Caso, DNP, AGNP-c _____________________________________________________________________________ ACTIVITY RECOMMENDATIONS   I recommend that all adults get at least 20 minutes of moderate physical activity that elevates your heart rate at least 5 days out of the week.  Some examples include: . Walking or jogging at a pace that allows you to carry on a conversation . Cycling (stationary bike or outdoors) . Water aerobics . Yoga . Weight lifting . Dancing If physical limitations prevent you from putting  stress on your joints, exercise in a pool or seated in a chair are excellent options.  Do determine your MAXIMUM heart rate for activity: YOUR AGE - 220 = MAX HeartRate   Remember! . Do not push yourself too hard.  . Start slowly and build up your pace, speed, weight, time in exercise, etc.  . Allow your body to rest between exercise and get good sleep. . You will need more water than normal when you are exerting yourself. Do not wait until you are thirsty to drink. Drink with a purpose of getting in at least 8, 8 ounce glasses of water a day plus more depending on how much you exercise and sweat.    If you begin to develop dizziness, chest pain, abdominal pain, jaw pain, shortness of breath, headache, vision changes, lightheadedness, or other concerning symptoms, stop the activity and allow your body to rest. If your symptoms are severe, seek emergency evaluation immediately. If your symptoms are concerning, but not severe, please let us know so that we can recommend further evaluation.    _____________________________________________________________________________ DIET RECOMMENDATIONS  I recommend that all patients maintain a diet low in saturated fats, carbohydrates, and cholesterol. While this can be challenging at first, it is not impossible and small changes can make big differences.  Things to try: Marland Kitchen Decreasing the amount of soda, sweet tea, and/or juice to one or less per day and replace with water o While water is always the first choice, if you do not like water you may consider - adding a water additive without sugar to improve the taste - other sugar free drinks . Replace potatoes with a brightly colored vegetable at dinner . Use healthy oils, such as canola oil or olive oil, instead of butter or hard margarine . Limit your bread intake to two pieces or less a day . Replace regular pasta with low carb pasta options . Bake, broil, or grill foods instead of frying . Monitor portion sizes  . Eat smaller, more frequent meals throughout the day instead of large meals  An important thing to remember is, if you love foods that are not great for your health, you don't have to give them up completely. Instead, allow these foods to be a reward when you have done well. Allowing yourself to still have special treats every once in a while is a nice way to tell yourself thank you for working hard to keep yourself healthy.   Also remember that every day is a new day. If you have a bad day and "fall off the wagon", you can still climb right back up and keep moving along on your journey!  We have resources available to help you!  Some websites that may be helpful include: . www.http://carter.biz/  . Www.VeryWellFit.com ___________________________________________________________________________________________

## 2020-06-27 ENCOUNTER — Encounter: Payer: Self-pay | Admitting: Physical Therapy

## 2020-06-27 ENCOUNTER — Ambulatory Visit: Payer: 59 | Admitting: Physical Therapy

## 2020-06-27 DIAGNOSIS — M542 Cervicalgia: Secondary | ICD-10-CM

## 2020-06-27 NOTE — Therapy (Addendum)
[place], [place], [address] Phone: [phone]   Fax:  [phone]  Physical Therapy Treatment / Discharge  Patient Details  Name: Paige Hansen MRN: [id number] Date of Birth: 1981/01/02 Referring Provider (PT): Sarina Ill, MD   Encounter Date: 06/27/2020   PT End of Session - 06/27/20 0937    Visit Number 5    Number of Visits 12    Date for PT Re-Evaluation 07/05/20    Authorization Type [facility], recheck FOTO visit 6    PT Start Time [phone]   pt arrived late   PT Stop Time 1017    PT Time Calculation (min) 41 min    Activity Tolerance Patient tolerated treatment well    Behavior During Therapy [facility] for tasks assessed/performed           Past Medical History:  Diagnosis Date  . Abdominal pain, epigastric 06/24/2012  . Allergy   . Anxiety   . Colitis   . Depression    Post Partum   . Diarrhea 06/24/2012  . Frequent headaches   . GERD (gastroesophageal reflux disease)   . Hemorrhoids   . Hypertension     Past Surgical History:  Procedure Laterality Date  . COLONOSCOPY    . ESSURE TUBAL LIGATION  04/2009  . none    . WISDOM TOOTH EXTRACTION     1998    There were no vitals filed for this visit.   Subjective Assessment - 06/27/20 0938    Subjective "I am doing pretty good. I've had about 5 headaches this week, its worse in the AM."    Patient Stated Goals Resolve headaches, if possible avoid ablation procedure    Currently in Pain? Yes    Pain Score 4     Pain Orientation Right    Pain Type Chronic pain    Pain Onset More than a month ago    Pain Frequency Intermittent                             [facility] Adult PT Treatment/Exercise - 06/27/20 0001      Self-Care   Posture sleeping posture/ positioning to reduce abnormal tension/ muscel strain      Manual Therapy   Manual Therapy Soft tissue mobilization    Manual therapy comments skilled palpation and  monitoring of pt throughout TPDN    Soft tissue mobilization IASTM along bil upper trap/ cervical paraspinals    Other Manual Therapy R upper trap inhibition taping            Trigger Point Dry Needling - 06/27/20 0001    Consent Given? Yes    Education Handout Provided Previously provided    Muscles Treated Head and Neck Upper trapezius;Cervical multifidi;Suboccipitals    E-stim with Dry Needling Details TENS 20 CPS, x 10 min increasing PRN throughout treatment   for cervical multifidi   Upper Trapezius Response Twitch reponse elicited;Palpable increased muscle length    Suboccipitals Response Twitch response elicited;Palpable increased muscle length    Cervical multifidi Response Twitch reponse elicited;Palpable increased muscle length                PT Education - 06/27/20 1018    Education Details reviewed sleeping posture/ positioning    Person(s) Educated Patient    Methods Explanation;Verbal cues    Comprehension Verbalized understanding;Verbal cues required  PT Long Term Goals - 05/24/20 1337      PT LONG TERM GOAL #1   Title Independent with HEP    Baseline needs HEP    Time 6    Period Weeks    Status New    Target Date 07/05/20      PT LONG TERM GOAL #2   Title Improve FOTO outcome measure score to 71% or greater functional status    Baseline 59% function    Time 6    Period Weeks    Status New    Target Date 07/05/20      PT LONG TERM GOAL #3   Title Increase bilateral cervical rotation AROM at least 5-10 deg to improve aiblity to turn head for activities such as driving    Baseline right 58 deg, left 60 deg    Time 6    Period Weeks    Status New    Target Date 07/05/20      PT LONG TERM GOAL #4   Title Decrease headache intensity by at [place] 50% from baseline status and pain from constant to intermittent to improve tolerance ADLs and lifting activities    Time 6    Period Weeks    Status New    Target Date 07/05/20                  Plan - 06/27/20 1018    Clinical Impression Statement pt reports continued HA with the frequency this week being 5 x times this week. cotninued TPDN focusing on bil upper trap and cervical multifidi / sub-occipitals which was combined with E-stim which was followed with IASTM techniques. Continued application of inhibition taping which she notes some relief of tension. reviewed posture as it relates to sleeping position to help reduce abnormal tension/ posture.    PT Treatment/Interventions ADLs/Self Care Home Management;Cryotherapy;Electrical Stimulation;Moist Heat;Therapeutic activities;Therapeutic exercise;Patient/family education;Manual techniques;Dry needling;Taping;Neuromuscular re-education;Ultrasound    PT Next Visit Plan continue DN, manual, stretches, SNAGS, postural training    Consulted and Agree with Plan of Care Patient           Patient will benefit from skilled therapeutic intervention in order to improve the following deficits and impairments:  Decreased activity tolerance,Impaired UE functional use,Impaired flexibility,Increased fascial restricitons,Decreased range of motion,Increased muscle spasms,Postural dysfunction,Pain  Visit Diagnosis: Cervicalgia     Problem List Patient Active Problem List   Diagnosis Date Noted  . Encounter to establish care 06/26/2020  . B12 deficiency 06/26/2020  . Weight gain 06/26/2020  . Primary hypertension 06/26/2020  . Post-COVID chronic dyspnea 06/26/2020  . Occipital neuralgia of right side 02/21/2020  . Cervico-occipital neuralgia of right side 02/21/2020  . Cervicalgia 01/25/2020  . Plantar fasciitis of left foot 12/09/2019  . Vitamin D deficiency 12/02/2019  . New daily persistent headache 11/30/2019  . Chest pain on breathing 11/30/2019  . Elevated heart rate with elevated blood pressure and diagnosis of hypertension 11/30/2019  . DOE (dyspnea on exertion) 11/30/2019  . Adhesive capsulitis of right  shoulder 05/19/2019  . Hiatal hernia 10/04/2018  . Ulcerative colitis, unspecified 10/19/2013  . Gastroesophageal reflux disease 10/19/2013  . Rectal bleeding 06/24/2012  . Hemorrhoids 06/24/2012    Starr Lake PT, DPT, LAT, ATC  06/27/20  10:26 AM      [facility] [address] [place], [place], [address] Phone: [phone]   Fax:  [phone]  Name: Paige Hansen MRN: [id number] Date of Birth: 1980/06/26  PHYSICAL THERAPY DISCHARGE SUMMARY  Visits from Start of Care: 5  Current functional level related to goals / functional outcomes: See goals   Remaining deficits: Current status unknown   Education / Equipment: HEP  Plan: Patient agrees to discharge.  Patient goals were not met. Patient is being discharged due to not returning since the last visit.  ?????        Izaya Netherton PT, DPT, LAT, ATC  08/09/20  10:19 AM

## 2020-07-02 ENCOUNTER — Other Ambulatory Visit: Payer: Self-pay

## 2020-07-03 ENCOUNTER — Encounter (HOSPITAL_BASED_OUTPATIENT_CLINIC_OR_DEPARTMENT_OTHER): Payer: Self-pay | Admitting: Nurse Practitioner

## 2020-07-06 ENCOUNTER — Encounter (HOSPITAL_BASED_OUTPATIENT_CLINIC_OR_DEPARTMENT_OTHER): Payer: Self-pay | Admitting: Nurse Practitioner

## 2020-07-17 ENCOUNTER — Telehealth (HOSPITAL_BASED_OUTPATIENT_CLINIC_OR_DEPARTMENT_OTHER): Payer: Self-pay | Admitting: Nurse Practitioner

## 2020-07-17 NOTE — Telephone Encounter (Signed)
Shauntel has notified me that she still hasn't heard anything about her [facility] prescription. She has called the pharmacy and they don't have any information for her. I have not received any notice of a prior authorization yet. Can you call the pharmacy for me and see if they have received a PA request? This is very unusual that we haven't heard anything at all. [place]. Thank you!

## 2020-07-18 ENCOUNTER — Encounter (HOSPITAL_BASED_OUTPATIENT_CLINIC_OR_DEPARTMENT_OTHER): Payer: Self-pay | Admitting: Nurse Practitioner

## 2020-07-18 ENCOUNTER — Other Ambulatory Visit (HOSPITAL_BASED_OUTPATIENT_CLINIC_OR_DEPARTMENT_OTHER): Payer: Self-pay | Admitting: Nurse Practitioner

## 2020-07-18 DIAGNOSIS — R635 Abnormal weight gain: Secondary | ICD-10-CM

## 2020-07-18 MED ORDER — SEMAGLUTIDE-WEIGHT MANAGEMENT 0.5 MG/0.5ML ~~LOC~~ SOAJ
0.5000 mg | SUBCUTANEOUS | 0 refills | Status: DC
  Filled 2020-07-18: qty 2, 28d supply, fill #0

## 2020-07-18 MED ORDER — SEMAGLUTIDE-WEIGHT MANAGEMENT 0.25 MG/0.5ML ~~LOC~~ SOAJ
0.2500 mg | SUBCUTANEOUS | 0 refills | Status: DC
  Filled 2020-07-18: qty 2, 28d supply, fill #0

## 2020-07-18 MED ORDER — SEMAGLUTIDE-WEIGHT MANAGEMENT 1.7 MG/0.75ML ~~LOC~~ SOAJ
1.7000 mg | SUBCUTANEOUS | 0 refills | Status: DC
  Filled 2020-07-18: qty 3, 28d supply, fill #0

## 2020-07-18 MED ORDER — SEMAGLUTIDE-WEIGHT MANAGEMENT 2.4 MG/0.75ML ~~LOC~~ SOAJ
2.4000 mg | SUBCUTANEOUS | 2 refills | Status: DC
Start: 2020-10-10 — End: 2020-07-19
  Filled 2020-07-18: qty 3, fill #0

## 2020-07-18 MED ORDER — SEMAGLUTIDE-WEIGHT MANAGEMENT 1 MG/0.5ML ~~LOC~~ SOAJ
1.0000 mg | SUBCUTANEOUS | 0 refills | Status: DC
  Filled 2020-07-18: qty 2, 28d supply, fill #0

## 2020-07-19 ENCOUNTER — Other Ambulatory Visit (HOSPITAL_COMMUNITY): Payer: Self-pay

## 2020-07-19 ENCOUNTER — Encounter: Payer: Self-pay | Admitting: Neurology

## 2020-07-19 ENCOUNTER — Ambulatory Visit: Payer: 59 | Admitting: Neurology

## 2020-07-19 VITALS — Ht 62.0 in | Wt 208.0 lb

## 2020-07-19 DIAGNOSIS — R0683 Snoring: Secondary | ICD-10-CM

## 2020-07-19 DIAGNOSIS — R519 Headache, unspecified: Secondary | ICD-10-CM

## 2020-07-19 DIAGNOSIS — G4719 Other hypersomnia: Secondary | ICD-10-CM | POA: Diagnosis not present

## 2020-07-19 DIAGNOSIS — E669 Obesity, unspecified: Secondary | ICD-10-CM

## 2020-07-19 DIAGNOSIS — R0681 Apnea, not elsewhere classified: Secondary | ICD-10-CM

## 2020-07-19 NOTE — Progress Notes (Signed)
Subjective:    Patient ID: Paige Hansen is a 40 y.o. female.  HPI     Star Age, MD, PhD [facility] [address], [address]O. [place], [place] [address]  Dear Warren Lacy,   I saw your patient, Paige Hansen, upon your kind request in my sleep clinic today for initial consultation of sleep disorder, in particular, concern for underlying obstructive sleep apnea.  The patient is unaccompanied today.  As you know, [name] is a 40 year old right-handed woman with an underlying medical history of occipital headaches, allergies, anxiety, colitis, depression, reflux disease, hypertension, and obesity, who reports snoring and recurrent headaches including morning headaches.  I reviewed your office note from 05/29/2020.  Her Epworth sleepiness score is 13 out of 24, fatigue severity score is 46 out of 63.  She has had weight gain over time and is working on weight loss.  She is supposed to start Adipex.  She could not get Wegovy.  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  She is married and lives with her husband and 2 children, ages 69 and 65.  They have 1 dog in the household. She is a non-smoker and does not drink alcohol and does not drink caffeine daily.  She does not have a TV in her bedroom, bedtime is generally around 10 or 11, when she has to work the next day she will rise at 5:40 AM, otherwise wake up time is between 6 and 7.  She has had recurrent morning headaches, nearly daily.  She has no night to night nocturia and denies any restless legs symptoms.  She works 3 days a week.   Her Past Medical History Is Significant For: Past Medical History:  Diagnosis Date  . Abdominal pain, epigastric 06/24/2012  . Allergy   . Anxiety   . Colitis   . Depression    Post Partum   . Diarrhea 06/24/2012  . Frequent headaches   . GERD (gastroesophageal reflux disease)   . Hemorrhoids   . Hypertension     Her Past Surgical  History Is Significant For: Past Surgical History:  Procedure Laterality Date  . COLONOSCOPY    . ESSURE TUBAL LIGATION  04/2009  . none    . Tees Toh    Her Family History Is Significant For: Family History  Problem Relation Age of Onset  . Breast cancer Maternal Grandmother   . Arthritis Maternal Grandmother   . Cancer Maternal Grandmother   . Hyperlipidemia Maternal Grandmother   . Hypertension Maternal Grandmother   . Kidney disease Maternal Grandmother   . Diabetes Mother   . Hyperlipidemia Mother   . Hypertension Mother   . Alcohol abuse Father   . Alcohol abuse Brother   . Asthma Brother   . Asthma Daughter   . Intellectual disability Daughter   . Arthritis Maternal Grandfather   . Cancer Maternal Grandfather   . Diabetes Maternal Grandfather   . Hearing loss Maternal Grandfather   . Heart disease Maternal Grandfather   . Hyperlipidemia Maternal Grandfather   . Hypertension Maternal Grandfather   . Kidney disease Maternal Grandfather   . Heart attack Maternal Grandfather   . Diabetes Paternal Grandmother   . Heart disease Paternal Grandmother   . Hypertension Paternal Grandmother   . Colon cancer Neg Hx   . Esophageal cancer Neg Hx   . Rectal cancer  Neg Hx   . Stomach cancer Neg Hx   . Colon polyps Neg Hx   . Headache Neg Hx   . Migraines Neg Hx     Her Social History Is Significant For: Social History   Socioeconomic History  . Marital status: Married    Spouse name: Not on file  . Number of children: 2  . Years of education: Not on file  . Highest education level: Not on file  Occupational History  . Occupation: [employer]    Employer: [employer]  Tobacco Use  . Smoking status: Never Smoker  . Smokeless tobacco: Never Used  Vaping Use  . Vaping Use: Never used  Substance and Sexual Activity  . Alcohol use: No  . Drug use: No  . Sexual activity: Yes    Birth control/protection: Other-see comments    Comment: essure implant    Other Topics Concern  . Not on file  Social History Narrative   Lives at home with husband and children   Right handed   Caffeine: 2 cups/day   Social Determinants of Health   Financial Resource Strain: Not on file  Food Insecurity: Not on file  Transportation Needs: Not on file  Physical Activity: Not on file  Stress: Not on file  Social Connections: Not on file    Her Allergies Are:  Allergies  Allergen Reactions  . Latex   :   Her Current Medications Are:  Outpatient Encounter Medications as of 07/19/2020  Medication Sig  . cyclobenzaprine (FLEXERIL) 10 MG tablet TAKE 1 TABLET BY MOUTH AS NEEDED.  . fluticasone furoate-vilanterol (BREO ELLIPTA) 100-25 MCG/INH AEPB Inhale 1 puff into the lungs daily. Sample Provided.  . gabapentin (NEURONTIN) 100 MG capsule TAKE 1 CAPSULE BY MOUTH 3 (THREE) TIMES DAILY.  . vitamin B-12 (CYANOCOBALAMIN) 500 MCG tablet Take 500 mcg by mouth daily.  . [DISCONTINUED] Cholecalciferol 25 MCG (1000 UT) capsule Take 1 capsule (1,000 Units total) by mouth daily.  . [DISCONTINUED] fluticasone (FLONASE) 50 MCG/ACT nasal spray Place 2 sprays into both nostrils daily.  . [DISCONTINUED] metoprolol succinate (TOPROL-XL) 25 MG 24 hr tablet Take 2 tablets (50 mg total) by mouth daily.  . [DISCONTINUED] metoprolol tartrate (LOPRESSOR) 100 MG tablet Take 1 tablet (100 mg) by mouth 2 hours prior to your Cardiac CTA  . [DISCONTINUED] ondansetron (ZOFRAN) 4 MG tablet Take 1 tablet (4 mg total) by mouth every 8 (eight) hours as needed for nausea or vomiting. (Patient not taking: Reported on 07/19/2020)  . [DISCONTINUED] Semaglutide-Weight Management 0.25 MG/0.5ML SOAJ Inject 0.25 mg into the skin once a week for 28 days.  . [DISCONTINUED] Semaglutide-Weight Management 0.5 MG/0.5ML SOAJ Inject 0.5 mg into the skin once a week for 28 days.  . [DISCONTINUED] Semaglutide-Weight Management 1 MG/0.5ML SOAJ Inject 1 mg into the skin once a week for 28 days.  .  [DISCONTINUED] Semaglutide-Weight Management 1.7 MG/0.75ML SOAJ Inject 1.7 mg into the skin once a week for 28 days.  . [DISCONTINUED] Semaglutide-Weight Management 2.4 MG/0.75ML SOAJ Inject 2.4 mg into the skin once a week for 28 days.   No facility-administered encounter medications on file as of 07/19/2020.  :   Review of Systems:  Out of a complete 14 point review of systems, all are reviewed and negative with the exception of these symptoms as listed below:  Review of Systems  Neurological:       Here for sleep consult, no prior sleep study. Epworth Sleepiness Scale 0= would never doze 1=  slight chance of dozing 2= moderate chance of dozing 3= high chance of dozing  Sitting and reading:3 Watching TV:2 Sitting inactive in a public place (ex. Theater or meeting):0 As a passenger in a car for an hour without a break:2 Lying down to rest in the afternoon:2 Sitting and talking to someone:0 Sitting quietly after lunch (no alcohol):3 In a car, while stopped in traffic:1 Total:13     Objective:  Neurological Exam  Physical Exam Physical Examination:   There were no vitals filed for this visit.  General Examination: The patient is a very pleasant 40 y.o. female in no acute distress. She appears well-developed and well-nourished and well groomed.   HEENT: Normocephalic, atraumatic, pupils are equal, round and reactive to light, extraocular tracking is good without limitation to gaze excursion or nystagmus noted. Hearing is grossly intact. Face is symmetric with normal facial animation. Speech is clear with no dysarthria noted. There is no hypophonia. There is no lip, neck/head, jaw or voice tremor. Neck is supple with full range of passive and active motion. There are no carotid bruits on auscultation. Oropharynx exam reveals: mild mouth dryness, good dental hygiene and mild airway crowding, due to small airway entry, tonsils are small, Mallampati class II, neck circumference of  15-7/8 inches.  She has a minimal overbite.  Tongue protrudes centrally and palate elevates symmetrically.   Chest: Clear to auscultation without wheezing, rhonchi or crackles noted.  Heart: S1+S2+0, regular and normal without murmurs, rubs or gallops noted.   Abdomen: Soft, non-tender and non-distended with normal bowel sounds appreciated on auscultation.  Extremities: There is trace pitting edema in the distal lower extremities bilaterally.   Skin: Warm and dry without trophic changes noted.   Musculoskeletal: exam reveals no obvious joint deformities, tenderness or joint swelling or erythema.   Neurologically:  Mental status: The patient is awake, alert and oriented in all 4 spheres. Her immediate and remote memory, attention, language skills and fund of knowledge are appropriate. There is no evidence of aphasia, agnosia, apraxia or anomia. Speech is clear with normal prosody and enunciation. Thought process is linear. Mood is normal and affect is normal.  Cranial nerves II - XII are as described above under HEENT exam.  Motor exam: Normal bulk, strength and tone is noted. There is no tremor, Romberg is negative. Fine motor skills and coordination: grossly intact.  Cerebellar testing: No dysmetria or intention tremor. There is no truncal or gait ataxia.  Sensory exam: intact to light touch in the upper and lower extremities.  Gait, station and balance: She stands easily. No veering to one side is noted. No leaning to one side is noted. Posture is age-appropriate and stance is narrow based. Gait shows normal stride length and normal pace. No problems turning are noted. Tandem walk is unremarkable.                Assessment and Plan:  In summary, PRINCESA WILLIG is a very pleasant 40 y.o.-year old female with an underlying medical history of occipital headaches, allergies, anxiety, colitis, depression, reflux disease, hypertension, and obesity, whose history and physical exam are concerning  for obstructive sleep apnea (OSA). I had a long chat with the patient about my findings and the diagnosis of OSA, its prognosis and treatment options. We talked about medical treatments, surgical interventions and non-pharmacological approaches. I explained in particular the risks and ramifications of untreated moderate to severe OSA, especially with respect to developing cardiovascular disease down the Road, including congestive  heart failure, difficult to treat hypertension, cardiac arrhythmias, or stroke. Even type 2 diabetes has, in part, been linked to untreated OSA. Symptoms of untreated OSA include daytime sleepiness, memory problems, mood irritability and mood disorder such as depression and anxiety, lack of energy, as well as recurrent headaches, especially morning headaches. We talked about trying to maintain a healthy lifestyle in general, as well as the importance of weight control. We also talked about the importance of good sleep hygiene. I recommended the following at this time: sleep study.   I explained the sleep test procedure to the patient and also outlined possible surgical and non-surgical treatment options of OSA, including the use of a custom-made dental device (which would require a referral to a specialist dentist or oral surgeon), upper airway surgical options, such as traditional UPPP or a novel less invasive surgical option in the form of Inspire hypoglossal nerve stimulation (which would involve a referral to an ENT surgeon). I also explained the CPAP treatment option to the patient, who indicated that she would be willing to try CPAP if the need arises. I explained the importance of being compliant with PAP treatment, not only for insurance purposes but primarily to improve Her symptoms, and for the patient's long term health benefit, including to reduce Her cardiovascular risks. I answered all her questions today and the patient was in agreement. I plan to see her back after the  sleep study is completed and encouraged her to call with any interim questions, concerns, problems or updates.   Thank you very much for allowing me to participate in the care of this nice patient. If I can be of any further assistance to you please do not hesitate to talk to me.  Sincerely,   Star Age, MD, PhD

## 2020-07-19 NOTE — Patient Instructions (Signed)
It was nice to meet you today!   Here is what we discussed today and what we came up with as our plan for you:    Based on your symptoms and your exam I believe you are at some risk for obstructive sleep apnea (aka OSA), and I think we should proceed with a sleep study to determine whether you do or do not have OSA and how severe it is. Even, if you have mild OSA, I may want you to consider treatment with CPAP, as treatment of even borderline or mild sleep apnea can result and improvement of symptoms such as sleep disruption, daytime sleepiness, nighttime bathroom breaks, restless leg symptoms, improvement of headache syndromes, even improved mood disorder.   As explained, an attended sleep study meaning you get to stay overnight in the sleep lab, lets [place] monitor sleep-related behaviors such as sleep talking and leg movements in sleep, in addition to monitoring for sleep apnea.  A home sleep test is a screening tool for sleep apnea only, and unfortunately does not help with any other sleep-related diagnoses.  Please remember, the long-term risks and ramifications of untreated moderate to severe obstructive sleep apnea are: increased Cardiovascular disease, including congestive heart failure, stroke, difficult to control hypertension, treatment resistant obesity, arrhythmias, especially irregular heartbeat commonly known as A. Fib. (atrial fibrillation); even type 2 diabetes has been linked to untreated OSA.   Sleep apnea can cause disruption of sleep and sleep deprivation in most cases, which, in turn, can cause recurrent headaches, problems with memory, mood, concentration, focus, and vigilance. Most people with untreated sleep apnea report excessive daytime sleepiness, which can affect their ability to drive. Please do not drive if you feel sleepy. Patients with sleep apnea can also develop difficulty initiating and maintaining sleep (aka insomnia).   Having sleep apnea may increase your risk for other  sleep disorders, including involuntary behaviors sleep such as sleep terrors, sleep talking, sleepwalking.    Having sleep apnea can also increase your risk for restless leg syndrome and leg movements at night.   Please note that untreated obstructive sleep apnea may carry additional perioperative morbidity. Patients with significant obstructive sleep apnea (typically, in the moderate to severe degree) should receive, if possible, perioperative PAP (positive airway pressure) therapy and the surgeons and particularly the anesthesiologists should be informed of the diagnosis and the severity of the sleep disordered breathing.   I will likely see you back after your sleep study to go over the test results and where to go from there. We will call you after your sleep study to advise about the results (most likely, you will hear from [facility], my nurse) and to set up an appointment at the time, as necessary.    Our sleep lab administrative assistant will call you to schedule your sleep study and give you further instructions, regarding the check in process for the sleep study, arrival time, what to bring, when you can expect to leave after the study, etc., and to answer any other logistical questions you may have. If you don't hear back from her by about 2 weeks from now, please feel free to call her direct line at [phone] or you can call our general clinic number, or email [place] through My Chart.

## 2020-07-23 ENCOUNTER — Other Ambulatory Visit (HOSPITAL_BASED_OUTPATIENT_CLINIC_OR_DEPARTMENT_OTHER): Payer: Self-pay | Admitting: Nurse Practitioner

## 2020-07-23 ENCOUNTER — Other Ambulatory Visit: Payer: Self-pay

## 2020-07-23 DIAGNOSIS — R635 Abnormal weight gain: Secondary | ICD-10-CM

## 2020-07-23 DIAGNOSIS — Z6838 Body mass index (BMI) 38.0-38.9, adult: Secondary | ICD-10-CM

## 2020-07-23 MED ORDER — PHENTERMINE HCL 37.5 MG PO CAPS
37.5000 mg | ORAL_CAPSULE | ORAL | 0 refills | Status: DC
  Filled 2020-07-23: qty 30, 30d supply, fill #0

## 2020-07-25 ENCOUNTER — Telehealth: Payer: Self-pay

## 2020-07-25 NOTE — Telephone Encounter (Signed)
LVM for pt to call me back to schedule sleep study  

## 2020-07-26 ENCOUNTER — Other Ambulatory Visit: Payer: Self-pay

## 2020-07-26 ENCOUNTER — Encounter (HOSPITAL_BASED_OUTPATIENT_CLINIC_OR_DEPARTMENT_OTHER): Payer: Self-pay | Admitting: Nurse Practitioner

## 2020-07-26 ENCOUNTER — Telehealth (INDEPENDENT_AMBULATORY_CARE_PROVIDER_SITE_OTHER): Payer: 59 | Admitting: Nurse Practitioner

## 2020-07-26 VITALS — BP 120/76 | HR 88 | Temp 98.8°F | Ht 62.0 in | Wt 208.8 lb

## 2020-07-26 DIAGNOSIS — Z6838 Body mass index (BMI) 38.0-38.9, adult: Secondary | ICD-10-CM | POA: Diagnosis not present

## 2020-07-26 DIAGNOSIS — I10 Essential (primary) hypertension: Secondary | ICD-10-CM

## 2020-07-26 NOTE — Patient Instructions (Signed)
Blood pressures are great! No further concerns.   Please let me know if they start to run high again or if you have any concerning symptoms.    Pease send me a message after about a week of phentermine to let me know how you are tolerating it. I will keep you updated on Wegovy

## 2020-07-26 NOTE — Progress Notes (Signed)
Virtual Visit via Telephone Note  I connected with  Paige Hansen on 07/27/20 at  1:30 PM EDT by telephone and verified that I am speaking with the correct person using two identifiers.   I discussed the limitations, risks, security and privacy concerns of performing an evaluation and management service by telephone and the availability of in person appointments. I also discussed with the patient that there may be a patient responsible charge related to this service. The patient expressed understanding and agreed to proceed.  Participating parties included in this telephone visit include: The patient and the nurse practitioner listed.  The patient is: At home I am: In the office  Subjective:    CC: HTN F/U  HPI: Paige Hansen is a 40 y.o. year old female presenting today via telephone visit to discuss blood pressures.  Reports BP at home has been 110's-130/60's-70's.  No headache, weakness, dizziness, CP, palpitations HR in 70's  Has not started weight loss medication yet- plans to start. Would like to change to [facility] as soon as manufacturing has returned.    Past medical history, Surgical history, Family history not pertinant except as noted below, Social history, Allergies, and medications have been entered into the medical record, reviewed, and corrections made.   Review of Systems:  All review of systems negative except what is listed in the HPI  Objective:    General:  Patient speaking clearly in complete sentences. No shortness of breath noted.   Alert and oriented x3.   Normal judgment.  No apparent acute distress.  Impression and Recommendations:    1. Primary hypertension BP well controlled- only on metoprolol 60m daily No tachycardia, CP, dizziness, fatigue, or weakness, headaches Continue medications as prescribed- monitor for low BP and/or low heart rate Follow-up with CPE or sooner if needed.   2. Body mass index (BMI) of 38.0-38.9 in  adult Phentermine at pharmacy Discussed monitoring for adverse effects Will plan to F/U in 4 weeks after start to ensure no complications and efficacy of medication [facility]. Monitor diet and exercise- notify of any concerns.     I discussed the assessment and treatment plan with the patient. The patient was provided an opportunity to ask questions and all were answered. The patient agreed with the plan and demonstrated an understanding of the instructions.   The patient was advised to call back or seek an in-person evaluation if the symptoms worsen or if the condition fails to improve as anticipated.  I provided 18 minutes of non-face-to-face time during this TELEPHONE encounter.    SOrma Render NP

## 2020-07-27 DIAGNOSIS — Z6838 Body mass index (BMI) 38.0-38.9, adult: Secondary | ICD-10-CM | POA: Insufficient documentation

## 2020-07-27 NOTE — Assessment & Plan Note (Signed)
Phentermine at pharmacy Discussed monitoring for adverse effects Will plan to F/U in 4 weeks after start to ensure no complications and efficacy of medication Will plan to switch to [facility] when manufacturing issues resolved. Monitor diet and exercise- notify of any concerns.

## 2020-07-27 NOTE — Assessment & Plan Note (Signed)
BP well controlled- only on metoprolol 82m daily No tachycardia, CP, dizziness, fatigue, or weakness, headaches Continue medications as prescribed- monitor for low BP and/or low heart rate Follow-up with CPE or sooner if needed.

## 2020-07-30 ENCOUNTER — Telehealth: Payer: Self-pay

## 2020-07-30 NOTE — Telephone Encounter (Signed)
Lvm for pt to call back to schedule sleep consult.

## 2020-08-01 ENCOUNTER — Other Ambulatory Visit: Payer: Self-pay

## 2020-08-28 ENCOUNTER — Encounter (HOSPITAL_BASED_OUTPATIENT_CLINIC_OR_DEPARTMENT_OTHER): Payer: Self-pay | Admitting: Nurse Practitioner

## 2020-08-29 ENCOUNTER — Other Ambulatory Visit (HOSPITAL_BASED_OUTPATIENT_CLINIC_OR_DEPARTMENT_OTHER): Payer: Self-pay | Admitting: Nurse Practitioner

## 2020-08-29 ENCOUNTER — Other Ambulatory Visit: Payer: Self-pay

## 2020-08-29 DIAGNOSIS — R635 Abnormal weight gain: Secondary | ICD-10-CM

## 2020-08-29 DIAGNOSIS — Z6838 Body mass index (BMI) 38.0-38.9, adult: Secondary | ICD-10-CM

## 2020-08-29 MED ORDER — PHENTERMINE HCL 37.5 MG PO CAPS
37.5000 mg | ORAL_CAPSULE | ORAL | 0 refills | Status: DC
  Filled 2020-08-29: qty 30, 30d supply, fill #0

## 2020-08-29 MED ORDER — PHENTERMINE HCL 37.5 MG PO CAPS
37.5000 mg | ORAL_CAPSULE | ORAL | 0 refills | Status: DC
  Filled 2020-08-29 – 2020-10-01 (×2): qty 30, 30d supply, fill #0

## 2020-08-29 MED ORDER — PHENTERMINE HCL 37.5 MG PO CAPS
37.5000 mg | ORAL_CAPSULE | ORAL | 0 refills | Status: DC
  Filled 2020-11-01: qty 30, 30d supply, fill #0

## 2020-09-13 ENCOUNTER — Other Ambulatory Visit: Payer: Self-pay

## 2020-09-13 ENCOUNTER — Ambulatory Visit (INDEPENDENT_AMBULATORY_CARE_PROVIDER_SITE_OTHER): Payer: 59 | Admitting: Neurology

## 2020-09-13 DIAGNOSIS — E669 Obesity, unspecified: Secondary | ICD-10-CM

## 2020-09-13 DIAGNOSIS — R0681 Apnea, not elsewhere classified: Secondary | ICD-10-CM

## 2020-09-13 DIAGNOSIS — R519 Headache, unspecified: Secondary | ICD-10-CM

## 2020-09-13 DIAGNOSIS — G4720 Circadian rhythm sleep disorder, unspecified type: Secondary | ICD-10-CM

## 2020-09-13 DIAGNOSIS — G4719 Other hypersomnia: Secondary | ICD-10-CM

## 2020-09-13 DIAGNOSIS — R0683 Snoring: Secondary | ICD-10-CM

## 2020-09-19 DIAGNOSIS — Z01419 Encounter for gynecological examination (general) (routine) without abnormal findings: Secondary | ICD-10-CM | POA: Diagnosis not present

## 2020-09-19 DIAGNOSIS — Z1231 Encounter for screening mammogram for malignant neoplasm of breast: Secondary | ICD-10-CM | POA: Diagnosis not present

## 2020-09-19 DIAGNOSIS — Z6834 Body mass index (BMI) 34.0-34.9, adult: Secondary | ICD-10-CM | POA: Diagnosis not present

## 2020-09-19 LAB — HM PAP SMEAR

## 2020-09-19 LAB — CBC AND DIFFERENTIAL: Hemoglobin: 14.2 (ref 12.0–16.0)

## 2020-09-20 DIAGNOSIS — Z01419 Encounter for gynecological examination (general) (routine) without abnormal findings: Secondary | ICD-10-CM | POA: Diagnosis not present

## 2020-09-21 ENCOUNTER — Encounter (HOSPITAL_BASED_OUTPATIENT_CLINIC_OR_DEPARTMENT_OTHER): Payer: Self-pay | Admitting: Nurse Practitioner

## 2020-09-26 ENCOUNTER — Other Ambulatory Visit: Payer: Self-pay

## 2020-09-26 MED FILL — Gabapentin Cap 100 MG: ORAL | 30 days supply | Qty: 90 | Fill #0 | Status: AC

## 2020-10-01 ENCOUNTER — Other Ambulatory Visit: Payer: Self-pay

## 2020-10-03 NOTE — Procedures (Signed)
PATIENT'S NAME:  Paige Hansen, Paige Hansen DOB:      [birth year]      MR#:    [id number]     DATE OF RECORDING: 09/13/2020 REFERRING M.D.:  Debbora Presto, NP Study Performed:   Baseline Polysomnogram HISTORY: 40 year old woman with a history of occipital headaches, allergies, anxiety, colitis, depression, reflux disease, hypertension, and obesity, who reports snoring and recurrent headaches including morning headaches. The patient endorsed the Epworth Sleepiness Scale at 13 points. The patient's weight 208 pounds with a height of 62 (inches), resulting in a BMI of 38.1 kg/m2. The patient's neck circumference measured 15 7/8 inches.  CURRENT MEDICATIONS: Flexeril, Neurontin, Vitamin B-12, Breo Ellipta   PROCEDURE:  This is a multichannel digital polysomnogram utilizing the Somnostar 11.2 system.  Electrodes and sensors were applied and monitored per AASM Specifications.   EEG, EOG, Chin and Limb EMG, were sampled at 200 Hz.  ECG, Snore and Nasal Pressure, Thermal Airflow, Respiratory Effort, CPAP Flow and Pressure, Oximetry was sampled at 50 Hz. Digital video and audio were recorded.      BASELINE STUDY  Lights Out was at 21:48 and Lights On at 04:46.  Total recording time (TRT) was 418.5 minutes, with a total sleep time (TST) of 392 minutes.   The patient's sleep latency was 7 minutes.  REM latency was 138.5 minutes, which is mildly delayed. The sleep efficiency was 93.7 %.     SLEEP ARCHITECTURE: WASO (Wake after sleep onset) was 19.5 minutes with mild sleep fragmentation noted. There were 80.5 minutes in Stage N1, 196.5 minutes Stage N2, 46.5 minutes Stage N3 and 68.5 minutes in Stage REM.  The percentage of Stage N1 was 20.5%, which is markedly increased, Stage N2 was 50.1%, which is normal, Stage N3 was 11.9% and Stage R (REM sleep) was 17.5%, which is mildly reduced. The arousals were noted as: 59 were spontaneous, 0 were associated with PLMs, 13 were associated with respiratory events.  RESPIRATORY ANALYSIS:   There were a total of 19 respiratory events:  0 obstructive apneas, 1 central apneas and 0 mixed apneas with a total of 1 apneas and an apnea index (AI) of .2 /hour. There were 18 hypopneas with a hypopnea index of 2.8 /hour. The patient also had 0 respiratory event related arousals (RERAs).      The total APNEA/HYPOPNEA INDEX (AHI) was 2.9/hour and the total RESPIRATORY DISTURBANCE INDEX was  2.9 /hour.  3 events occurred in REM sleep and 32 events in NREM. The REM AHI was  2.6 /hour, versus a non-REM AHI of 3.. The patient spent 2.5 minutes of total sleep time in the supine position and 390 minutes in non-supine.. The supine AHI was n/a versus a non-supine AHI of 3.0.  OXYGEN SATURATION & C02:  The Wake baseline 02 saturation was 94%, with the lowest being 90%. Time spent below 89% saturation equaled 0 minutes.   PERIODIC LIMB MOVEMENTS: The patient had a total of 0 Periodic Limb Movements.  The Periodic Limb Movement (PLM) index was 0 and the PLM Arousal index was 0/hour.  Audio and video analysis did not show any abnormal or unusual movements, behaviors, phonations or vocalizations. The patient took no bathroom breaks. Intermittent mild snoring was noted. The EKG was in keeping with normal sinus rhythm (NSR).  Post-study, the patient indicated that sleep was the same as usual.   IMPRESSION:  Primary Snoring Dysfunctions associated with sleep stages or arousal from sleep  RECOMMENDATIONS:  This study does not demonstrate any significant obstructive  or central sleep disordered breathing. Overall AHI was 2.9/hour, O2 nadir 90%. Mild, intermittent snoring was noted. The absence of supine sleep may underestimate her snoring and sleep disordered breathing. Avoidance of the supine sleep position and weight loss may aid in reducing her snoring. This study does not support an intrinsic sleep disorder as a cause of the patient's symptoms. Other causes, including circadian rhythm disturbances, an  underlying mood disorder, medication effect and/or an underlying medical problem cannot be ruled out. This study shows some sleep fragmentation and abnormal sleep stage percentages; these are nonspecific findings and per se do not signify an intrinsic sleep disorder or a cause for the patient's sleep-related symptoms. Causes include (but are not limited to) the first night effect of the sleep study, circadian rhythm disturbances, medication effect or an underlying mood disorder or medical problem.  The patient should be cautioned not to drive, work at heights, or operate dangerous or heavy equipment when tired or sleepy. Review and reiteration of good sleep hygiene measures should be pursued with any patient. The patient will be advised to follow up with the referring provider, who will be notified of the test results.  I certify that I have reviewed the entire raw data recording prior to the issuance of this report in accordance with the Standards of Accreditation of the American Academy of Sleep Medicine (AASM)  Star Age, MD, PhD Diplomat, American Board of Neurology and Sleep Medicine (Neurology and Sleep Medicine)

## 2020-10-03 NOTE — Progress Notes (Signed)
See procedure note.

## 2020-10-04 ENCOUNTER — Telehealth: Payer: Self-pay

## 2020-10-04 NOTE — Telephone Encounter (Signed)
-----   Message from Star Age, MD sent at 10/03/2020  7:04 PM EDT ----- Patient referred by AL, seen by me on 07/19/20, diagnostic PSG on 09/13/20.   Please call and notify the patient that the recent sleep study did not show any significant obstructive sleep apnea. Overall AHI was 2.9/hour, O2 nadir 90%. Mild, intermittent snoring was noted. The absence of supine sleep may have underestimated her snoring and sleep disordered breathing. Avoidance of the supine sleep position and weight loss may aid in reducing her snoring. At this juncture, she can follow up with Amy and Dr. Jaynee Eagles as scheduled.  Thanks,  Star Age, MD, PhD [facility])

## 2020-10-04 NOTE — Telephone Encounter (Signed)
I called the pt and left a voicemail advising of results of sleep study ( ok per dpr).  Pt was advised if she had any questions/concerns to please call back.

## 2020-10-12 DIAGNOSIS — N924 Excessive bleeding in the premenopausal period: Secondary | ICD-10-CM | POA: Diagnosis not present

## 2020-11-01 ENCOUNTER — Other Ambulatory Visit: Payer: Self-pay

## 2020-11-01 ENCOUNTER — Encounter: Payer: Self-pay | Admitting: Physician Assistant

## 2020-11-01 ENCOUNTER — Encounter (HOSPITAL_BASED_OUTPATIENT_CLINIC_OR_DEPARTMENT_OTHER): Payer: Self-pay | Admitting: Nurse Practitioner

## 2020-11-07 ENCOUNTER — Ambulatory Visit: Payer: 59 | Admitting: Physician Assistant

## 2020-11-13 ENCOUNTER — Other Ambulatory Visit: Payer: Self-pay

## 2020-11-23 ENCOUNTER — Encounter: Payer: Self-pay | Admitting: Family Medicine

## 2020-11-27 ENCOUNTER — Other Ambulatory Visit: Payer: Self-pay

## 2020-11-27 ENCOUNTER — Encounter: Payer: Self-pay | Admitting: Family Medicine

## 2020-11-27 ENCOUNTER — Telehealth: Payer: 59 | Admitting: Family Medicine

## 2020-11-27 DIAGNOSIS — M5481 Occipital neuralgia: Secondary | ICD-10-CM

## 2020-11-27 MED ORDER — OXCARBAZEPINE 150 MG PO TABS
150.0000 mg | ORAL_TABLET | Freq: Two times a day (BID) | ORAL | 1 refills | Status: DC
  Filled 2020-11-27: qty 180, 90d supply, fill #0

## 2020-11-27 MED ORDER — GABAPENTIN 100 MG PO CAPS
ORAL_CAPSULE | ORAL | 1 refills | Status: DC
  Filled 2020-11-27: qty 270, 90d supply, fill #0

## 2020-11-27 NOTE — Patient Instructions (Signed)
Below is our plan:  We will continue gabapentin 182m in the mornings and 2060min the evenings. I will add oxcarbazepine 15034mwice daily. Start with 150m3mily at bedtime with gabapentin for the first 1-2 weeks. If well tolerated, increase dose to twice daily. Let me know if you have any unwanted side effects as discussed.   Please make sure you are staying well hydrated. I recommend 50-60 ounces daily. Well balanced diet and regular exercise encouraged. Consistent sleep schedule with 6-8 hours recommended.   Please continue follow up with care team as directed.   Follow up with me in January.   You may receive a survey regarding today's visit. I encourage you to leave honest feed back as I do use this information to improve patient care. Thank you for seeing me today!

## 2020-11-27 NOTE — Progress Notes (Signed)
PATIENT: Paige Hansen DOB: 12-11-1980  REASON FOR VISIT: follow up HISTORY FROM: patient  Virtual Visit via Telephone Note  I connected with Paige Hansen on 11/27/20 at  9:15 AM EDT by telephone and verified that I am speaking with the correct person using two identifiers.   I discussed the limitations, risks, security and privacy concerns of performing an evaluation and management service by telephone and the availability of in person appointments. I also discussed with the patient that there may be a patient responsible charge related to this service. The patient expressed understanding and agreed to proceed.   History of Present Illness:  11/27/20 ALL: Paige Hansen is a 40 y.o. female here today for follow up for headaches. She reports that she is having more frequent and intense electrical sensations of the right occipital region. She feels "zapping" occurs about 10 times a day and last for several seconds then resolves. Pain radiates to the top of her head and occasionally to her right eye. She continues gabapentin 198m in the mornings and 2035mat bedtime. She can not tolerate higher doses due to sleepiness. Cyclobenzaprine was not helpful. Sleep study was normal. Last B12 was 268. She continues oral supplements.   05/29/20 ALL:  Paige Hansen a [age].o. female here today for follow up for headaches. She was seen by Dr AhJaynee Eaglesn 02/2020 for dull headache in occipital region of her head with occasional feelings of being "zapped", mostly on right side. Headaches started following administration of 1st Covid vaccine. She was given occipital nerve block in the office that was helpful.  PT recommended for dry needling as she carried more stress in her neck and shoulders. She has had one session and feels headache may be a little worse. She feels that the intensity is worse.  Dr AhJaynee Eagleslso started gabapentin 10019mp to three times daily. She does not feel that it helps.  Flexeril helps more than anything. When she takes it at night, she wakes without a headache. B12 supplements recommended. She has not seen PCP for recheck. PCP moved and she is looking for a new provider. She feels that she is sleeping ok. She usually goes to bed aroun 10p. Wakes around 6a. She does snore. She wakes herself up snoring. She endorses a dry mouth. Most all headaches are present in the morning but unsure if they are present upon waking. She feels that she is tired during the day. She takes a nap every day around 1-3 if she is not working. Mom has sleep apnea but doesn't use CPAP.   Observations/Objective:  Generalized: Well developed, in no acute distress  Mentation: Alert oriented to time, place, history taking. Follows all commands speech and language fluent   Assessment and Plan:  [age]o. year old female  has a past medical history of Abdominal pain, epigastric (06/24/2012), Allergy, Anxiety, Colitis, Depression, Diarrhea (06/24/2012), Frequent headaches, GERD (gastroesophageal reflux disease), Hemorrhoids, and Hypertension. here with    ICD-10-CM   1. Occipital neuralgia of right side  M54.81       Paige Hansen had worsening pain over the past few months. We will continue gabapentin 100m45m am and 200mg76mthe evenings. I will add oxcarbazepine 150mg 51m She will take 150mg d92m at bedtime for 1-2 weeks then increase dose to BID if well tolerated. Side effects reviewed. She will return for labs in 3 months. Sleep study was normal. She was encouraged to follow up with  PCP for B12 monitoring. She will continue healthy lifestyle habits. Follow up scheduled with me 04/03/2021.   No orders of the defined types were placed in this encounter.   Meds ordered this encounter  Medications   gabapentin (NEURONTIN) 100 MG capsule    Sig: TAKE 1 CAPSULE BY MOUTH 3 (THREE) TIMES DAILY.    Dispense:  270 capsule    Refill:  1    Order Specific Question:   Supervising Provider    Answer:    Melvenia Beam [[id number]]   OXcarbazepine (TRILEPTAL) 150 MG tablet    Sig: Take 1 tablet (150 mg total) by mouth 2 (two) times daily.    Dispense:  180 tablet    Refill:  1    Order Specific Question:   Supervising Provider    Answer:   Melvenia Beam [id number]     Follow Up Instructions:  I discussed the assessment and treatment plan with the patient. The patient was provided an opportunity to ask questions and all were answered. The patient agreed with the plan and demonstrated an understanding of the instructions.   The patient was advised to call back or seek an in-person evaluation if the symptoms worsen or if the condition fails to improve as anticipated.  I provided 15 minutes of non-face-to-face time during this encounter. Patient located at their place of residence during [place] visit. Provider is in the office.    Debbora Presto, NP

## 2020-11-28 ENCOUNTER — Other Ambulatory Visit: Payer: Self-pay

## 2020-11-29 ENCOUNTER — Ambulatory Visit (INDEPENDENT_AMBULATORY_CARE_PROVIDER_SITE_OTHER): Payer: 59 | Admitting: Physician Assistant

## 2020-11-29 ENCOUNTER — Other Ambulatory Visit: Payer: Self-pay

## 2020-11-29 ENCOUNTER — Encounter: Payer: Self-pay | Admitting: Physician Assistant

## 2020-11-29 VITALS — BP 110/86 | HR 104 | Ht 62.6 in | Wt 190.4 lb

## 2020-11-29 DIAGNOSIS — R1084 Generalized abdominal pain: Secondary | ICD-10-CM

## 2020-11-29 DIAGNOSIS — R110 Nausea: Secondary | ICD-10-CM

## 2020-11-29 DIAGNOSIS — R1013 Epigastric pain: Secondary | ICD-10-CM

## 2020-11-29 DIAGNOSIS — R194 Change in bowel habit: Secondary | ICD-10-CM | POA: Diagnosis not present

## 2020-11-29 MED ORDER — PANTOPRAZOLE SODIUM 40 MG PO TBEC
40.0000 mg | DELAYED_RELEASE_TABLET | Freq: Every day | ORAL | 1 refills | Status: DC
  Filled 2020-11-29: qty 30, 30d supply, fill #0
  Filled 2021-01-28: qty 30, 30d supply, fill #1

## 2020-11-29 MED ORDER — NA SULFATE-K SULFATE-MG SULF 17.5-3.13-1.6 GM/177ML PO SOLN
1.0000 | Freq: Once | ORAL | 0 refills | Status: AC
  Filled 2020-11-29: qty 354, 1d supply, fill #0

## 2020-11-29 NOTE — Telephone Encounter (Signed)
Script sent to pharmacy.

## 2020-11-29 NOTE — Progress Notes (Signed)
Chief Complaint: Epigastric pain, nausea, constipation  HPI:    Paige Hansen is a 40 year old female, known to Dr. Havery Moros from previous EGD, with a past medical history as listed below including reflux and anxiety, who was referred to me by Early, Coralee Pesa, NP for a complaint of epigastric pain, nausea and constipation.    07/15/2012 colonoscopy with abnormal mucosa in the rectum and sigmoid colon.  Started on Lialda.  Pathology showed mild active chronic colitis.    07/06/2017 right upper quadrant ultrasound was normal.      09/28/2017 HIDA scan normal.    06/30/2017 office visit with Tye Savoy for right upper quadrant pain associated with nausea as well as reflux.  Described history of left-sided colitis found in 2014 with biopsies compatible with active chronic colitis.  She had been off of Mesalamine for 4 years with normal bowel movements.  At that time she was scheduled for an EGD and told to use Pantoprazole 40 mg every morning.    08/28/2017 EGD with 2 cm hiatal hernia and otherwise normal.    Today, the patient presents to clinic and tells me that 2 months ago she had a change in her regular soft solid bowel movements daily to constipation going sometimes 3 to 4 days in between bowel movements.  She tells me that she will get constipated and started having some epigastric pain that radiates over to her left upper quadrant and start with a lot of excessive gas for which she uses Simethicone.  She will start taking 2-3 stool softeners daily but if she does not go by day 3-4 then she will use milk of magnesia, once she has a bowel movement then she has a generalized abdominal "ache", she tells me this feels "like the colitis".  Remains nauseous almost this entire time.  Also tells me that while constipated she will have a reflux of a mucus type material but "not acid".    Started Phentermine about 4 months ago.    Denies fever, chills or blood in her stool.  Past Medical History:  Diagnosis  Date   Abdominal pain, epigastric 06/24/2012   Allergy    Anxiety    Colitis    Depression    Post Partum    Diarrhea 06/24/2012   Frequent headaches    GERD (gastroesophageal reflux disease)    Hemorrhoids    Hypertension     Past Surgical History:  Procedure Laterality Date   COLONOSCOPY     ESSURE TUBAL LIGATION  04/2009   none     WISDOM TOOTH EXTRACTION     1998    Current Outpatient Medications  Medication Sig Dispense Refill   gabapentin (NEURONTIN) 100 MG capsule TAKE 1 CAPSULE BY MOUTH 3 (THREE) TIMES DAILY. 270 capsule 1   OXcarbazepine (TRILEPTAL) 150 MG tablet Take 1 tablet (150 mg total) by mouth 2 (two) times daily. 180 tablet 1   phentermine 37.5 MG capsule Take 1 capsule (37.5 mg total) by mouth every morning. 30 capsule 0   vitamin B-12 (CYANOCOBALAMIN) 500 MCG tablet Take 500 mcg by mouth daily.     No current facility-administered medications for this visit.    Allergies as of 11/29/2020 - Review Complete 11/29/2020  Allergen Reaction Noted   Latex  11/30/2019    Family History  Problem Relation Age of Onset   Breast cancer Maternal Grandmother    Arthritis Maternal Grandmother    Cancer Maternal Grandmother    Hyperlipidemia Maternal Grandmother  Hypertension Maternal Grandmother    Kidney disease Maternal Grandmother    Diabetes Mother    Hyperlipidemia Mother    Hypertension Mother    Alcohol abuse Father    Alcohol abuse Brother    Asthma Brother    Asthma Daughter    Intellectual disability Daughter    Arthritis Maternal Grandfather    Cancer Maternal Grandfather    Diabetes Maternal Grandfather    Hearing loss Maternal Grandfather    Heart disease Maternal Grandfather    Hyperlipidemia Maternal Grandfather    Hypertension Maternal Grandfather    Kidney disease Maternal Grandfather    Heart attack Maternal Grandfather    Diabetes Paternal Grandmother    Heart disease Paternal Grandmother    Hypertension Paternal Grandmother     Colon cancer Neg Hx    Esophageal cancer Neg Hx    Rectal cancer Neg Hx    Stomach cancer Neg Hx    Colon polyps Neg Hx    Headache Neg Hx    Migraines Neg Hx     Social History   Socioeconomic History   Marital status: Married    Spouse name: Not on file   Number of children: 2   Years of education: Not on file   Highest education level: Not on file  Occupational History   Occupation: [employer]    Employer: [employer]  Tobacco Use   Smoking status: Never   Smokeless tobacco: Never  Vaping Use   Vaping Use: Never used  Substance and Sexual Activity   Alcohol use: No   Drug use: No   Sexual activity: Yes    Birth control/protection: Other-see comments    Comment: essure implant   Other Topics Concern   Not on file  Social History Narrative   Lives at home with husband and children   Right handed   Caffeine: 2 cups/day   Social Determinants of Health   Financial Resource Strain: Not on file  Food Insecurity: Not on file  Transportation Needs: Not on file  Physical Activity: Not on file  Stress: Not on file  Social Connections: Not on file  Intimate Partner Violence: Not on file    Review of Systems  Constitutional:  Negative for chills, fever and weight loss.  HENT:  Negative for congestion, hearing loss and sore throat.   Respiratory:  Negative for cough, shortness of breath and wheezing.   Cardiovascular:  Negative for chest pain, palpitations and leg swelling.  Gastrointestinal:  Positive for abdominal pain, constipation and nausea. Negative for blood in stool, heartburn and vomiting.  Genitourinary:  Negative for dysuria, frequency and hematuria.  Musculoskeletal:  Negative for back pain, falls and myalgias.  Skin:  Negative for itching and rash.  Neurological:  Negative for dizziness, weakness and headaches.  Psychiatric/Behavioral:  Negative for depression and substance abuse. The patient is nervous/anxious.     Physical Exam:  Vital signs: BP 110/86 (BP  Location: Left Arm, Patient Position: Sitting, Cuff Size: Normal)   Pulse (!) 104   Ht 5' 2.6" (1.59 m) Comment: height measured without shoes  Wt 190 lb 6 oz (86.4 kg)   LMP 11/17/2020   BMI 34.16 kg/m    Constitutional:   Overweight Caucasian female appears to be in NAD, Well developed, Well nourished, alert and cooperative Respiratory: Respirations even and unlabored. Lungs clear to auscultation bilaterally.   No wheezes, crackles, or rhonchi.  Cardiovascular: Normal S1, S2. No MRG. Regular rate and rhythm. No peripheral edema, cyanosis  or pallor.  Gastrointestinal:  Soft, nondistended, mild epigastric ttp, No rebound or guarding. Normal bowel sounds. No appreciable masses or hepatomegaly. Rectal:  Not performed.  Psychiatric: Demonstrates good judgement and reason without abnormal affect or behaviors.  No recent labs.  Assessment: 1.  Epigastric pain: Over the past 2 months with a change in bowel habits towards constipation, history of gastritis; consider constipation versus gastritis 2.  Change in bowel habits: As above, history of chronic colitis diagnosed in 2014, patient describes similar abdominal pain now; consider IBD versus IBS versus relation to Phentermine 3.  Nausea: With above 4.  History of colitis  Plan: 1.  Discussed with patient that due to her history of colitis and biopsies at the time showing this was chronic and her recent change in bowel habits with abdominal pain would recommend that we repeat a colonoscopy for further evaluation.  Scheduled the patient in the [facility] with Dr. Havery Moros. Patient is appropriate for endoscopic procedure(s) in the ambulatory ([facility]) setting.  Did provide the patient a detailed list of risks for the procedure and she agrees to proceed. 2.  For now would recommend the patient start MiraLAX twice daily, titrated to a soft solid bowel movement every 1 to 2 days.  Discontinue stool softeners. 3.  Prescribed Pantoprazole 40 mg once daily, 30-60  minutes before breakfast #30.  Discussed that she can try using this for a month and then discontinue if feeling better.  Refill x3. 4.  Patient was advised to hold her Phentermine for 10 days prior to time of procedure. 6.  Patient to follow in clinic per recommendations from Dr. Havery Moros after time of procedure.  Ellouise Newer, PA-C [facility] 11/29/2020, 9:31 AM  Cc: Orma Render, NP

## 2020-11-29 NOTE — Patient Instructions (Signed)
We have sent the following medications to your pharmacy for you to pick up at your convenience: Pantoprazole 40 mg once daily 30-60 minutes before breakfast.  Start Miralax 1 capful daily in 8 ounces of liquid.   Stop stool softeners.   If you are age 42 or older, your body mass index should be between 23-30. Your Body mass index is 34.16 kg/m. If this is out of the aforementioned range listed, please consider follow up with your Primary Care Provider.  If you are age 18 or younger, your body mass index should be between 19-25. Your Body mass index is 34.16 kg/m. If this is out of the aformentioned range listed, please consider follow up with your Primary Care Provider.   __________________________________________________________  The [facility] providers would like to encourage you to use [facility] to communicate with providers for non-urgent requests or questions.  Due to long hold times on the telephone, sending your provider a message by [facility] may be a faster and more efficient way to get a response.  Please allow 48 business hours for a response.  Please remember that this is for non-urgent requests.

## 2020-11-29 NOTE — Addendum Note (Signed)
Addended by: Horris Latino on: 11/29/2020 03:55 PM   Modules accepted: Orders

## 2020-12-02 NOTE — Progress Notes (Signed)
Agree with assessment and plan as outlined.  

## 2020-12-11 ENCOUNTER — Other Ambulatory Visit: Payer: Self-pay

## 2020-12-17 ENCOUNTER — Encounter: Payer: Self-pay | Admitting: Gastroenterology

## 2020-12-17 ENCOUNTER — Other Ambulatory Visit: Payer: Self-pay

## 2020-12-17 ENCOUNTER — Ambulatory Visit (AMBULATORY_SURGERY_CENTER): Payer: 59 | Admitting: Gastroenterology

## 2020-12-17 VITALS — BP 105/69 | HR 74 | Temp 98.6°F | Resp 14 | Ht 62.6 in | Wt 190.0 lb

## 2020-12-17 DIAGNOSIS — D123 Benign neoplasm of transverse colon: Secondary | ICD-10-CM | POA: Diagnosis not present

## 2020-12-17 DIAGNOSIS — K5900 Constipation, unspecified: Secondary | ICD-10-CM | POA: Diagnosis not present

## 2020-12-17 DIAGNOSIS — R197 Diarrhea, unspecified: Secondary | ICD-10-CM | POA: Diagnosis not present

## 2020-12-17 DIAGNOSIS — I10 Essential (primary) hypertension: Secondary | ICD-10-CM | POA: Diagnosis not present

## 2020-12-17 DIAGNOSIS — K648 Other hemorrhoids: Secondary | ICD-10-CM

## 2020-12-17 DIAGNOSIS — R194 Change in bowel habit: Secondary | ICD-10-CM

## 2020-12-17 DIAGNOSIS — D125 Benign neoplasm of sigmoid colon: Secondary | ICD-10-CM | POA: Diagnosis not present

## 2020-12-17 DIAGNOSIS — R10819 Abdominal tenderness, unspecified site: Secondary | ICD-10-CM

## 2020-12-17 DIAGNOSIS — D127 Benign neoplasm of rectosigmoid junction: Secondary | ICD-10-CM | POA: Diagnosis not present

## 2020-12-17 DIAGNOSIS — D124 Benign neoplasm of descending colon: Secondary | ICD-10-CM

## 2020-12-17 DIAGNOSIS — Z8719 Personal history of other diseases of the digestive system: Secondary | ICD-10-CM

## 2020-12-17 DIAGNOSIS — K635 Polyp of colon: Secondary | ICD-10-CM | POA: Diagnosis not present

## 2020-12-17 MED ORDER — SODIUM CHLORIDE 0.9 % IV SOLN: 500.0000 mL | Freq: Once | INTRAVENOUS | Status: DC

## 2020-12-17 NOTE — Progress Notes (Signed)
Called to room to assist during endoscopic procedure.  Patient ID and intended procedure confirmed with present staff. Received instructions for my participation in the procedure from the performing physician.  

## 2020-12-17 NOTE — Progress Notes (Signed)
Pt. Drowsy, feels a bit dizzy. VSS.  Skin warm and dry, color normal.    Pt. Held in PACU until alert and ready to get dressed, hence delay in D/C time.

## 2020-12-17 NOTE — Progress Notes (Signed)
1335 HR > 100 with esmolol 25 mg given IV, MD updated, vss

## 2020-12-17 NOTE — Progress Notes (Signed)
Report given to PACU, vss 

## 2020-12-17 NOTE — Progress Notes (Signed)
Pt's states no medical or surgical changes since previsit or office visit.   DT vitals.

## 2020-12-17 NOTE — Patient Instructions (Signed)
Impression/Recommendations:  Polyp and hemorrhoid handouts given to patient.  Resume previous diet. Continue present medications. Trial of Miralax daily and titrate up as needed to prevent constipation.  Await pathology results.  YOU HAD AN ENDOSCOPIC PROCEDURE TODAY AT [facility]:   Refer to the procedure report that was given to you for any specific questions about what was found during the examination.  If the procedure report does not answer your questions, please call your gastroenterologist to clarify.  If you requested that your care partner not be given the details of your procedure findings, then the procedure report has been included in a sealed envelope for you to review at your convenience later.  YOU SHOULD EXPECT: Some feelings of bloating in the abdomen. Passage of more gas than usual.  Walking can help get rid of the air that was put into your GI tract during the procedure and reduce the bloating. If you had a lower endoscopy (such as a colonoscopy or flexible sigmoidoscopy) you may notice spotting of blood in your stool or on the toilet paper. If you underwent a bowel prep for your procedure, you may not have a normal bowel movement for a few days.  Please Note:  You might notice some irritation and congestion in your nose or some drainage.  This is from the oxygen used during your procedure.  There is no need for concern and it should clear up in a day or so.  SYMPTOMS TO REPORT IMMEDIATELY:  Following lower endoscopy (colonoscopy or flexible sigmoidoscopy):  Excessive amounts of blood in the stool  Significant tenderness or worsening of abdominal pains  Swelling of the abdomen that is new, acute  Fever of 100F or higher For urgent or emergent issues, a gastroenterologist can be reached at any hour by calling [phone]. Do not use MyChart messaging for urgent concerns.    DIET:  We do recommend a small meal at first, but then you may proceed to  your regular diet.  Drink plenty of fluids but you should avoid alcoholic beverages for 24 hours.  ACTIVITY:  You should plan to take it easy for the rest of today and you should NOT DRIVE or use heavy machinery until tomorrow (because of the sedation medicines used during the test).    FOLLOW UP: Our staff will call the number listed on your records 48-72 hours following your procedure to check on you and address any questions or concerns that you may have regarding the information given to you following your procedure. If we do not reach you, we will leave a message.  We will attempt to reach you two times.  During this call, we will ask if you have developed any symptoms of COVID 19. If you develop any symptoms (ie: fever, flu-like symptoms, shortness of breath, cough etc.) before then, please call [phone].  If you test positive for Covid 19 in the 2 weeks post procedure, please call and report this information to [place].    If any biopsies were taken you will be contacted by phone or by letter within the next 1-3 weeks.  Please call us at [phone] if you have not heard about the biopsies in 3 weeks.    SIGNATURES/CONFIDENTIALITY: You and/or your care partner have signed paperwork which will be entered into your electronic medical record.  These signatures attest to the fact that that the information above on your After Visit Summary has been reviewed and is understood.  Full responsibility of  the confidentiality of this discharge information lies with you and/or your care-partner.

## 2020-12-17 NOTE — Progress Notes (Signed)
History and Physical Interval Note: Patient seen 11/29/20 without interval changes. Has a history of left sided colitis, previously on Lialda but since stopped it years ago. Was doing well but now having some constipation and intermittent abdominal pain. Here for surveillance colonoscopy. Have discussed risks / benefits she agrees, wishes to proceed. Otherwise no other changes in her status.  12/17/2020 1:37 PM  Paige Hansen  has presented today for endoscopic procedure(s), with the diagnosis of  Encounter Diagnoses  Name Primary?   History of colitis Yes   Change in bowel habits   .  The various methods of evaluation and treatment have been discussed with the patient and/or family. After consideration of risks, benefits and other options for treatment, the patient has consented to  the endoscopic procedure(s).   The patient's history has been reviewed, patient examined, no change in status, stable for surgery.  I have reviewed the patient's chart and labs.  Questions were answered to the patient's satisfaction.    Jolly Mango, MD [facility]

## 2020-12-17 NOTE — Op Note (Signed)
[facility] Patient Name: Paige Hansen Procedure Date: 12/17/2020 1:42 PM MRN: [id number] Endoscopist: Remo Lipps P. Havery Moros , MD Age: 40 Referring MD:  Date of Birth: [birth year] Gender: Female Account #: [id number] Procedure:                Colonoscopy Indications:              Follow-up of left-sided chronic ulcerative colitis                            - diagnosed years ago, was on Lialda, stopped 4                            years ago and generally had been doing well. Recent                            constipation and abdominal discomfort Medicines:                Monitored Anesthesia Care Procedure:                Pre-Anesthesia Assessment:                           - Prior to the procedure, a History and Physical                            was performed, and patient medications and                            allergies were reviewed. The patient's tolerance of                            previous anesthesia was also reviewed. The risks                            and benefits of the procedure and the sedation                            options and risks were discussed with the patient.                            All questions were answered, and informed consent                            was obtained. Prior Anticoagulants: The patient has                            taken no previous anticoagulant or antiplatelet                            agents. ASA Grade Assessment: II - A patient with                            mild systemic disease. After reviewing the risks  and benefits, the patient was deemed in                            satisfactory condition to undergo the procedure.                           After obtaining informed consent, the colonoscope                            was passed under direct vision. Throughout the                            procedure, the patient's blood pressure, pulse, and                            oxygen  saturations were monitored continuously. The                            PCF-HQ190L Colonoscope was introduced through the                            anus and advanced to the the terminal ileum, with                            identification of the appendiceal orifice and IC                            valve. The colonoscopy was performed without                            difficulty. The patient tolerated the procedure                            well. The quality of the bowel preparation was                            good. The terminal ileum, ileocecal valve,                            appendiceal orifice, and rectum were photographed. Scope In: 1:44:49 PM Scope Out: 2:02:45 PM Scope Withdrawal Time: 0 hours 14 minutes 5 seconds  Total Procedure Duration: 0 hours 17 minutes 56 seconds  Findings:                 The perianal and digital rectal examinations were                            normal.                           The terminal ileum appeared normal.                           A 6 mm polyp was found in the transverse colon. The  polyp was flat. The polyp was removed with a cold                            snare. Resection and retrieval were complete.                           A 3 mm polyp was found in the descending colon. The                            polyp was sessile. The polyp was removed with a                            cold snare. Resection and retrieval were complete.                           A diminutive polyp was found in the sigmoid colon.                            The polyp was sessile. The polyp was removed with a                            cold biopsy forceps. Resection and retrieval were                            complete.                           A 3 mm polyp was found in the recto-sigmoid colon.                            The polyp was sessile. The polyp was removed with a                            cold snare. Resection and retrieval  were complete.                           Internal hemorrhoids were found during retroflexion.                           The exam was otherwise without abnormality.                           Biopsies were taken with a cold forceps in the                            rectum, in the sigmoid colon and in the descending                            colon for histology. Complications:            No immediate complications. Estimated blood loss:  Minimal. Estimated Blood Loss:     Estimated blood loss was minimal. Impression:               - The examined portion of the ileum was normal.                           - One 6 mm polyp in the transverse colon, removed                            with a cold snare. Resected and retrieved.                           - One 3 mm polyp in the descending colon, removed                            with a cold snare. Resected and retrieved.                           - One diminutive polyp in the sigmoid colon,                            removed with a cold biopsy forceps. Resected and                            retrieved.                           - One 3 mm polyp at the recto-sigmoid colon,                            removed with a cold snare. Resected and retrieved.                           - Internal hemorrhoids.                           - The examination was otherwise normal.                           - Biopsies were taken with a cold forceps for                            histology in the rectum, in the sigmoid colon and                            in the descending colon.                           No overt inflammatory changes noted, colitis                            appears in remission, will await biopsy results. Recommendation:           - Patient has a contact number available for  emergencies. The signs and symptoms of potential                            delayed complications were discussed with the                             patient. Return to normal activities tomorrow.                            Written discharge instructions were provided to the                            patient.                           - Resume previous diet.                           - Continue present medications.                           - Trial of Miralax daily and titrate up as needed                            to prevent constipation                           - Await pathology results. Remo Lipps P. Cuahutemoc Attar, MD 12/17/2020 2:09:00 PM This report has been signed electronically.

## 2020-12-19 ENCOUNTER — Telehealth: Payer: Self-pay

## 2020-12-19 NOTE — Telephone Encounter (Signed)
  Follow up Call-  Call back number 12/17/2020  Post procedure Call Back phone  # [phone]  Permission to leave phone message Yes  Some recent data might be hidden     Patient questions:  Do you have a fever, pain , or abdominal swelling? No. Pain Score  0 *  Have you tolerated food without any problems? Yes.    Have you been able to return to your normal activities? Yes.    Do you have any questions about your discharge instructions: Diet   No. Medications  No. Follow up visit  No.  Do you have questions or concerns about your Care? No.  Actions: * If pain score is 4 or above: No action needed, pain <4.  Have you developed a fever since your procedure? no  2.   Have you had an respiratory symptoms (SOB or cough) since your procedure? no  3.   Have you tested positive for COVID 19 since your procedure no  4.   Have you had any family members/close contacts diagnosed with the COVID 19 since your procedure?  no   If yes to any of these questions please route to Joylene John, RN and Joella Prince, RN

## 2020-12-24 ENCOUNTER — Other Ambulatory Visit (HOSPITAL_BASED_OUTPATIENT_CLINIC_OR_DEPARTMENT_OTHER): Payer: Self-pay | Admitting: Nurse Practitioner

## 2020-12-24 DIAGNOSIS — Z6838 Body mass index (BMI) 38.0-38.9, adult: Secondary | ICD-10-CM

## 2020-12-24 DIAGNOSIS — R635 Abnormal weight gain: Secondary | ICD-10-CM

## 2020-12-25 ENCOUNTER — Other Ambulatory Visit (HOSPITAL_BASED_OUTPATIENT_CLINIC_OR_DEPARTMENT_OTHER): Payer: Self-pay | Admitting: Nurse Practitioner

## 2020-12-25 ENCOUNTER — Other Ambulatory Visit: Payer: Self-pay

## 2020-12-25 DIAGNOSIS — R635 Abnormal weight gain: Secondary | ICD-10-CM

## 2020-12-25 DIAGNOSIS — Z6838 Body mass index (BMI) 38.0-38.9, adult: Secondary | ICD-10-CM

## 2020-12-27 ENCOUNTER — Other Ambulatory Visit: Payer: Self-pay

## 2020-12-31 ENCOUNTER — Other Ambulatory Visit: Payer: Self-pay

## 2021-01-02 ENCOUNTER — Other Ambulatory Visit: Payer: Self-pay

## 2021-01-02 ENCOUNTER — Encounter (HOSPITAL_BASED_OUTPATIENT_CLINIC_OR_DEPARTMENT_OTHER): Payer: Self-pay | Admitting: Nurse Practitioner

## 2021-01-03 ENCOUNTER — Other Ambulatory Visit: Payer: Self-pay

## 2021-01-03 MED ORDER — PHENTERMINE HCL 37.5 MG PO CAPS
ORAL_CAPSULE | ORAL | 0 refills | Status: DC
  Filled 2021-01-03 – 2021-04-15 (×2): qty 30, 30d supply, fill #0

## 2021-01-03 MED ORDER — PHENTERMINE HCL 37.5 MG PO CAPS
ORAL_CAPSULE | ORAL | 0 refills | Status: DC
  Filled 2021-01-03: qty 30, fill #0
  Filled 2021-02-25: qty 30, 30d supply, fill #0

## 2021-01-03 MED FILL — Phentermine HCl Cap 37.5 MG: ORAL | 30 days supply | Qty: 30 | Fill #0 | Status: AC

## 2021-01-16 ENCOUNTER — Other Ambulatory Visit: Payer: Self-pay

## 2021-01-17 IMAGING — DX DG CERVICAL SPINE COMPLETE 4+V
6 series · 6 of 6 positions shown · non-contrast
Comparison: None.

CLINICAL DATA: Posterior HA- x 3 weeks

EXAM:
CERVICAL SPINE - COMPLETE 4+ VIEW

[cervical spine ap]
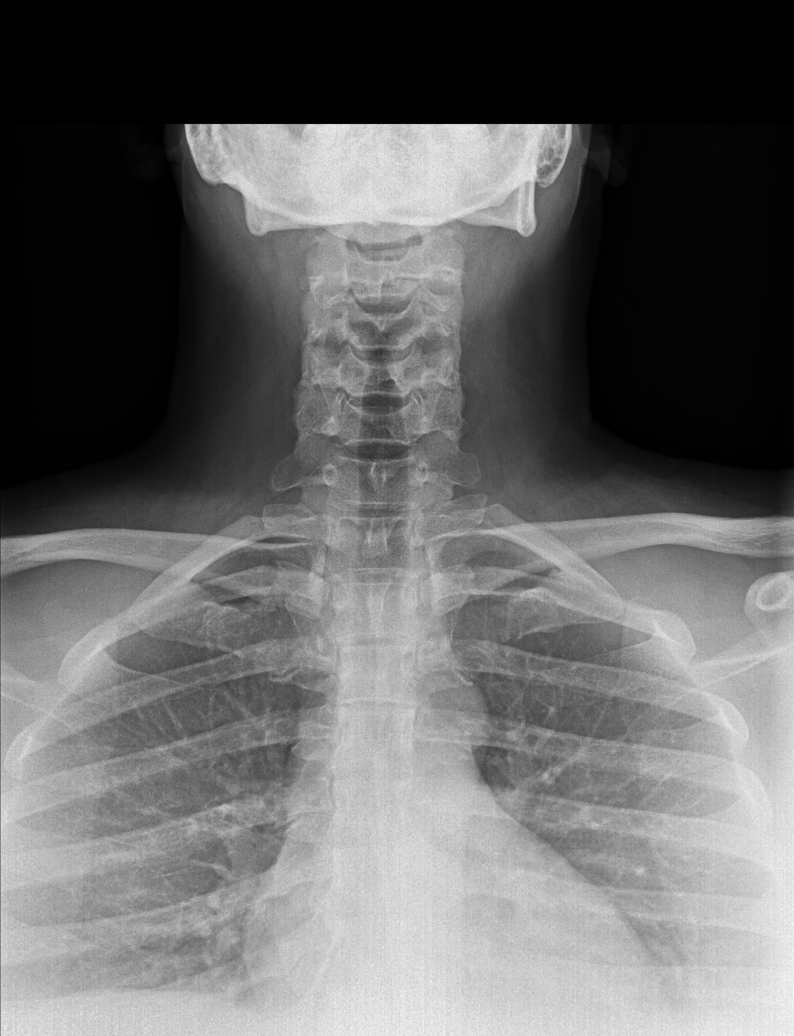

[cervical spine oblique (1 of 2)]
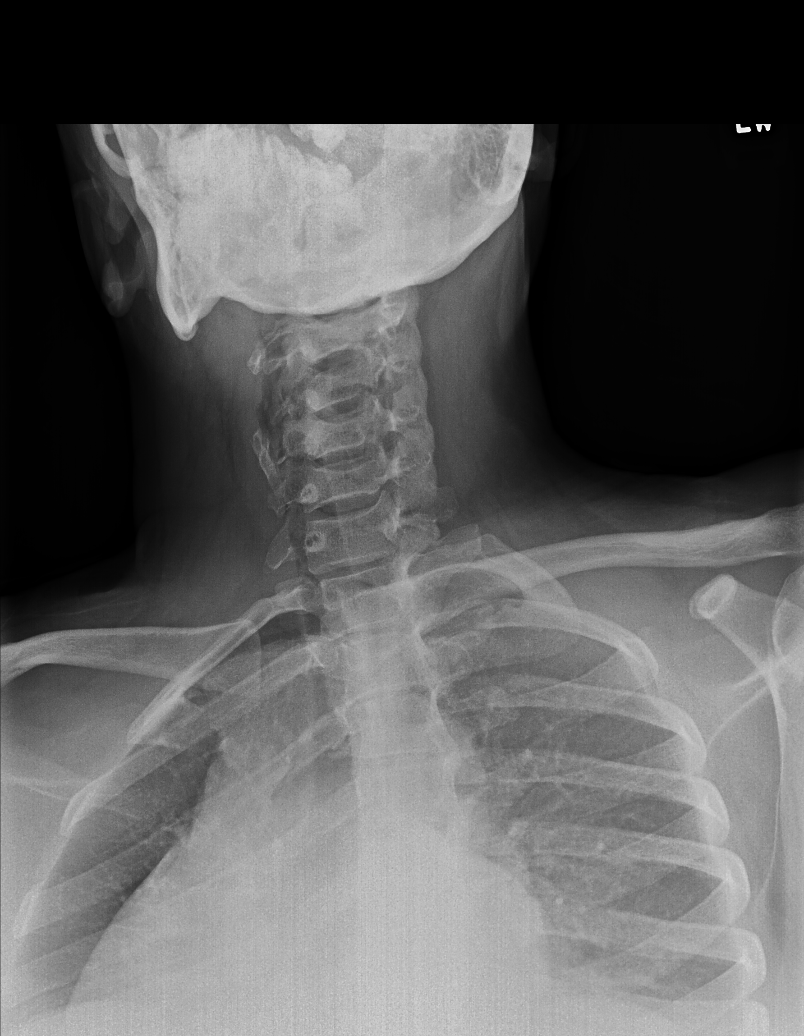

[cervical spine oblique (2 of 2)]
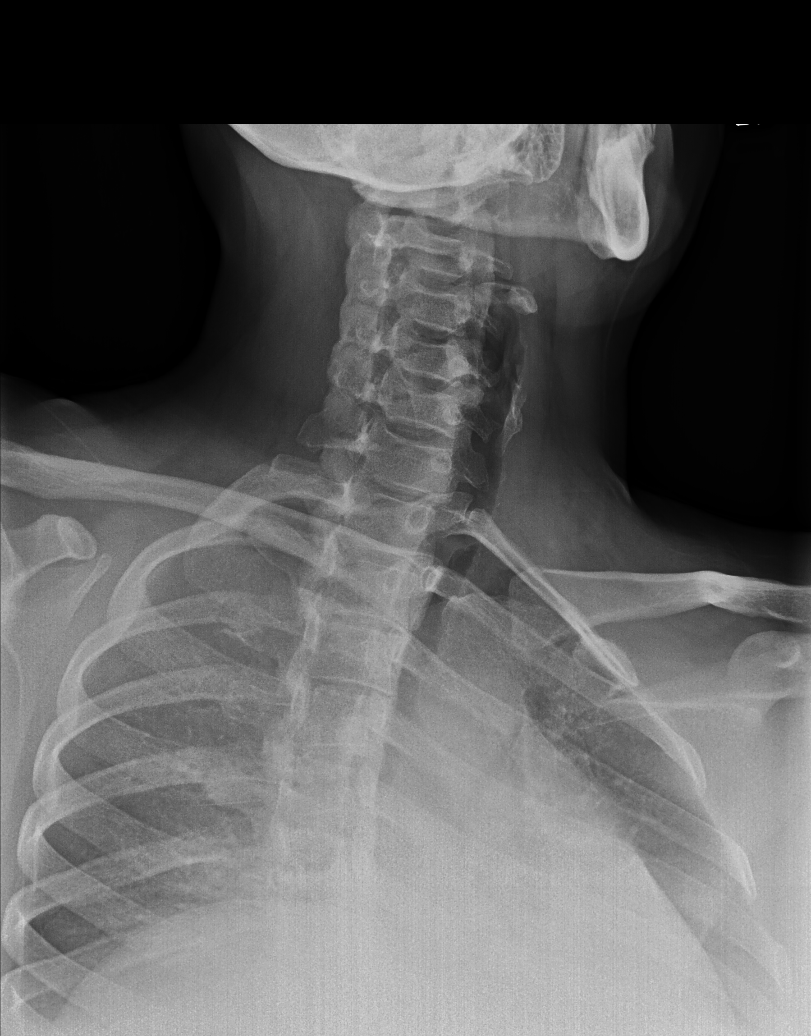

[cervical spine lat]
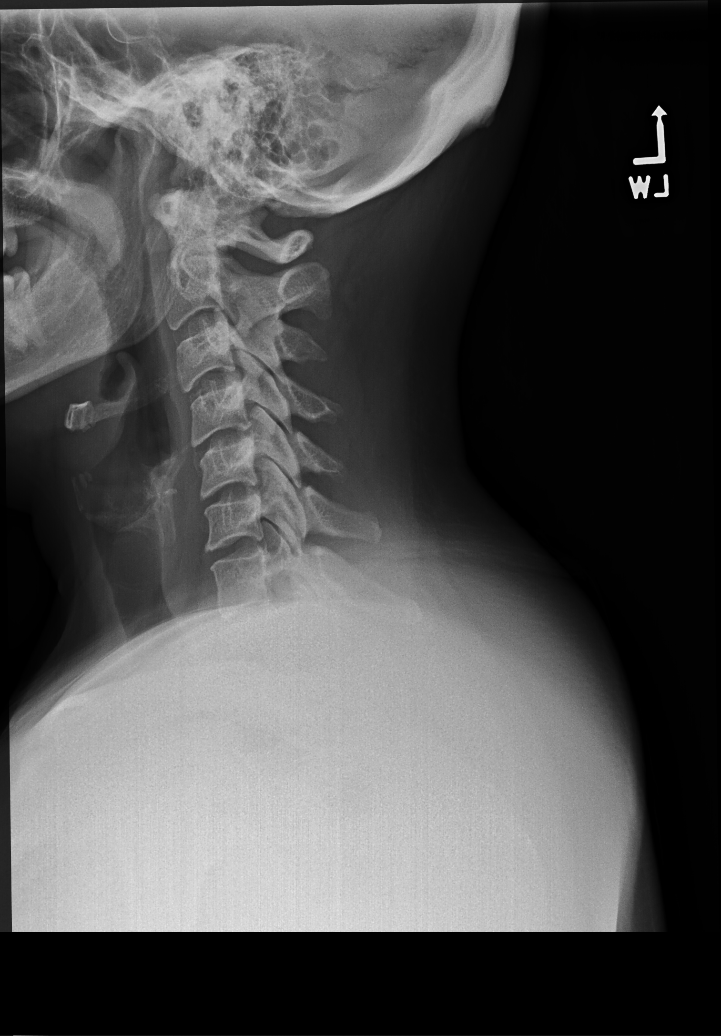

[swimmers lat]
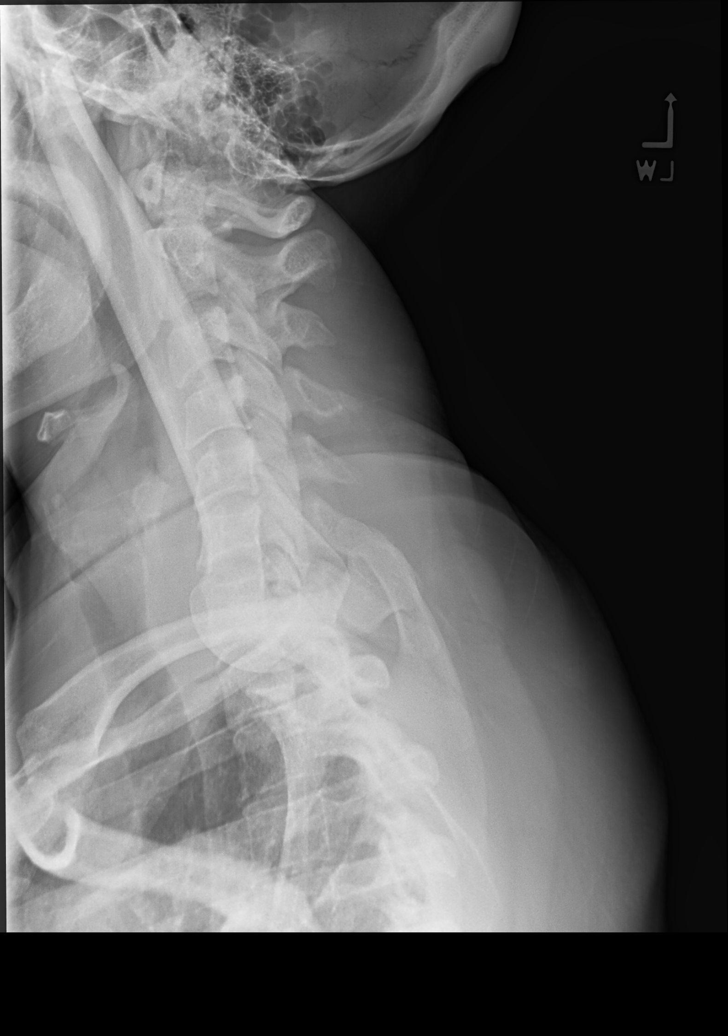

[cervical spine open mouth ap]
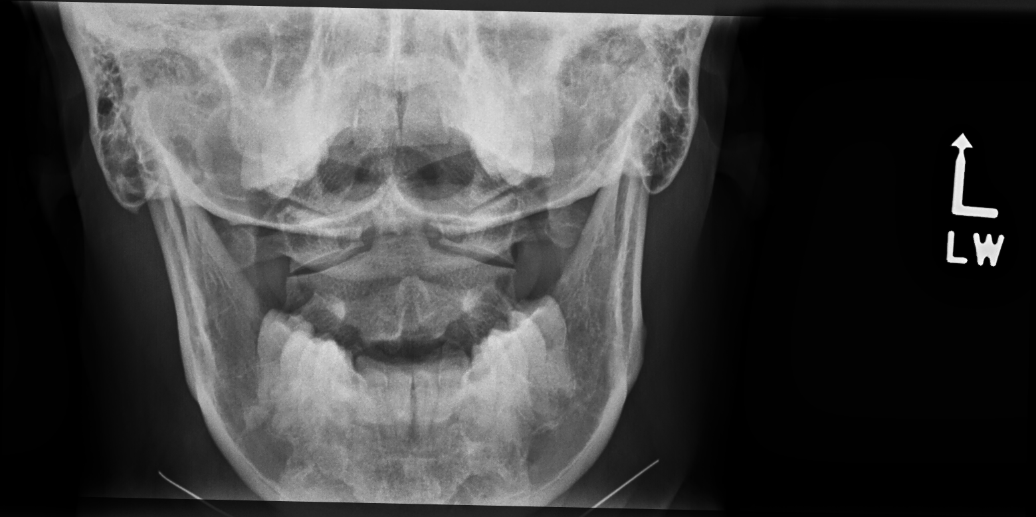

[6 of 6 positions shown; findings below may reference images not displayed]

FINDINGS: Straightening of the normal cervical lordosis may be related to
positioning or muscle spasm. There is no evidence of cervical spine
fracture or prevertebral soft tissue swelling. Alignment is normal.
No other significant bone abnormalities are identified.
IMPRESSION: No acute osseous abnormality.

## 2021-01-28 ENCOUNTER — Other Ambulatory Visit: Payer: Self-pay

## 2021-02-25 ENCOUNTER — Other Ambulatory Visit: Payer: Self-pay | Admitting: Physician Assistant

## 2021-02-25 ENCOUNTER — Other Ambulatory Visit: Payer: Self-pay

## 2021-02-25 MED ORDER — PANTOPRAZOLE SODIUM 40 MG PO TBEC
40.0000 mg | DELAYED_RELEASE_TABLET | Freq: Every day | ORAL | 1 refills | Status: DC
  Filled 2021-02-25: qty 60, 60d supply, fill #0

## 2021-03-23 ENCOUNTER — Encounter (HOSPITAL_COMMUNITY): Payer: Self-pay | Admitting: Emergency Medicine

## 2021-03-23 ENCOUNTER — Emergency Department (HOSPITAL_COMMUNITY)
Admission: EM | Admit: 2021-03-23 | Discharge: 2021-03-23 | Disposition: A | Discharge status: Home / Self Care | Payer: 59 | Attending: Emergency Medicine | Admitting: Emergency Medicine

## 2021-03-23 ENCOUNTER — Other Ambulatory Visit: Payer: Self-pay

## 2021-03-23 DIAGNOSIS — Z79899 Other long term (current) drug therapy: Secondary | ICD-10-CM | POA: Insufficient documentation

## 2021-03-23 DIAGNOSIS — T7840XA Allergy, unspecified, initial encounter: Secondary | ICD-10-CM | POA: Diagnosis not present

## 2021-03-23 DIAGNOSIS — Z91040 Latex allergy status: Secondary | ICD-10-CM | POA: Diagnosis not present

## 2021-03-23 DIAGNOSIS — R0689 Other abnormalities of breathing: Secondary | ICD-10-CM | POA: Diagnosis not present

## 2021-03-23 DIAGNOSIS — L299 Pruritus, unspecified: Secondary | ICD-10-CM | POA: Diagnosis not present

## 2021-03-23 DIAGNOSIS — L509 Urticaria, unspecified: Secondary | ICD-10-CM | POA: Diagnosis not present

## 2021-03-23 DIAGNOSIS — I10 Essential (primary) hypertension: Secondary | ICD-10-CM | POA: Insufficient documentation

## 2021-03-23 DIAGNOSIS — R000 Tachycardia, unspecified: Secondary | ICD-10-CM | POA: Diagnosis not present

## 2021-03-23 DIAGNOSIS — T782XXA Anaphylactic shock, unspecified, initial encounter: Secondary | ICD-10-CM | POA: Diagnosis not present

## 2021-03-23 DIAGNOSIS — R21 Rash and other nonspecific skin eruption: Secondary | ICD-10-CM | POA: Diagnosis present

## 2021-03-23 MED ORDER — EPINEPHRINE 0.3 MG/0.3ML IJ SOAJ: 0.3000 mg | INTRAMUSCULAR | 0 refills | Status: AC | PRN

## 2021-03-23 MED ORDER — FAMOTIDINE IN NACL 20-0.9 MG/50ML-% IV SOLN
20.0000 mg | Freq: Once | INTRAVENOUS | Status: AC
  Administered 2021-03-23: 20 mg via INTRAVENOUS
  Filled 2021-03-23: qty 50

## 2021-03-23 MED ORDER — SODIUM CHLORIDE 0.9 % IV BOLUS
1000.0000 mL | Freq: Once | INTRAVENOUS | Status: AC
  Administered 2021-03-23: 1000 mL via INTRAVENOUS

## 2021-03-23 MED ORDER — PREDNISONE 50 MG PO TABS: 50.0000 mg | ORAL_TABLET | Freq: Every day | ORAL | 0 refills | Status: AC

## 2021-03-23 NOTE — ED Triage Notes (Signed)
Pt BIB [facility] after exposure to latex balloons. She has a hx of several reactions to latex in the past, which each one worsening. Tonight is the first time a reaction has required epi. She had hives covering face , neck, and chest and felt like her throat was closing. No syncope, nausea, or vomiting. Symptoms have improved.  0.3 epi, 125 mg solumedrol  and 4m of benadryl PTA.  100% room air, no wheezing or stridor.  18g LAC.

## 2021-03-23 NOTE — Discharge Instructions (Signed)
Your history, exam, evaluation are consistent with anaphylactic reaction today.  The EMS team gave you steroids, epinephrine, and antihistamines and we continued with some white histamines and observed you for several hours.  We feel you are now safe for discharge home but please fill prescription for the steroids to start tomorrow for the next 5 days and please use antihistamines to help with any further itching.  Please fill the prescription for EpiPen to keep with you at all times.  Please follow-up with your primary doctor.  If any symptoms change or worsen acutely, please return to the nearest emergency department.

## 2021-03-23 NOTE — ED Provider Notes (Signed)
[facility] DEPT Provider Note   CSN: [id number] Arrival date & time: 03/23/21  1945     History  Chief Complaint  Patient presents with   Allergic Reaction    Paige Hansen is a 41 y.o. female.  The history is provided by the patient, medical records and the spouse. No language interpreter was used.  Allergic Reaction Presenting symptoms: difficulty breathing, itching, rash and wheezing   Severity:  Severe Prior episodes: latex. Relieved by:  Epinephrine, steroids and antihistamines Worsened by:  Nothing     Home Medications Prior to Admission medications   Medication Sig Start Date End Date Taking? Authorizing Provider  gabapentin (NEURONTIN) 100 MG capsule TAKE 1 CAPSULE BY MOUTH 3 (THREE) TIMES DAILY. 11/27/20 11/27/21  Lomax, Amy, NP  metoprolol succinate (TOPROL-XL) 25 MG 24 hr tablet Take 1 tablet by mouth daily.    [provider]  OXcarbazepine (TRILEPTAL) 150 MG tablet Take 1 tablet (150 mg total) by mouth 2 (two) times daily. Patient not taking: Reported on 12/17/2020 11/27/20   Lomax, Amy, NP  pantoprazole (PROTONIX) 40 MG tablet Take 1 tablet (40 mg total) by mouth daily. 02/25/21   Levin Erp, PA  phentermine 37.5 MG capsule Take 1 capsule (37.5 mg total) by mouth every morning. 01/03/21   Orma Render, NP  phentermine 37.5 MG capsule take 1 capsule by mouth every morning 02/28/21   Early, Coralee Pesa, NP  phentermine 37.5 MG capsule Take 1 capsule by mouth in the morning 01/31/21   Early, Coralee Pesa, NP  vitamin B-12 (CYANOCOBALAMIN) 500 MCG tablet Take 500 mcg by mouth daily.    [provider]  fluticasone (FLONASE) 50 MCG/ACT nasal spray Place 2 sprays into both nostrils daily. 04/02/20 05/29/20  Montine Circle, PA-C  metoprolol tartrate (LOPRESSOR) 100 MG tablet Take 1 tablet (100 mg) by mouth 2 hours prior to your Cardiac CTA 01/18/20 01/25/20  Kate Sable, MD      Allergies    Latex    Review  of Systems   Review of Systems  Constitutional:  Negative for chills, fatigue and fever.  HENT:  Negative for congestion.   Eyes:  Negative for visual disturbance.  Respiratory:  Positive for shortness of breath and wheezing. Negative for cough.   Cardiovascular:  Positive for palpitations. Negative for chest pain and leg swelling.  Gastrointestinal:  Positive for nausea. Negative for abdominal pain, constipation, diarrhea and vomiting.  Genitourinary:  Negative for dysuria, flank pain and frequency.  Musculoskeletal:  Negative for back pain.  Skin:  Positive for itching and rash.  Neurological:  Positive for light-headedness. Negative for dizziness, syncope, numbness and headaches.  Psychiatric/Behavioral:  Negative for agitation and confusion.   All other systems reviewed and are negative.  Physical Exam Updated Vital Signs BP 133/83    Pulse (!) 116    Temp 97.7 F (36.5 C) (Oral)    Resp (!) 25    SpO2 99%  Physical Exam Vitals and nursing note reviewed.  Constitutional:      General: She is not in acute distress.    Appearance: She is well-developed. She is not ill-appearing, toxic-appearing or diaphoretic.  HENT:     Head: Normocephalic and atraumatic.     Nose: Nose normal.     Mouth/Throat:     Mouth: Mucous membranes are moist.     Pharynx: No oropharyngeal exudate or posterior oropharyngeal erythema.  Eyes:     Extraocular Movements: Extraocular movements intact.  Conjunctiva/sclera: Conjunctivae normal.     Pupils: Pupils are equal, round, and reactive to light.  Cardiovascular:     Rate and Rhythm: Regular rhythm. Tachycardia present.     Pulses: Normal pulses.     Heart sounds: No murmur heard. Pulmonary:     Effort: Pulmonary effort is normal. No respiratory distress.     Breath sounds: Normal breath sounds. No wheezing, rhonchi or rales.  Chest:     Chest wall: No tenderness.  Abdominal:     General: Abdomen is flat.     Palpations: Abdomen is soft.      Tenderness: There is no abdominal tenderness. There is no right CVA tenderness, left CVA tenderness, guarding or rebound.  Musculoskeletal:        General: No swelling or tenderness.     Cervical back: Neck supple. No tenderness.  Skin:    General: Skin is warm and dry.     Capillary Refill: Capillary refill takes less than 2 seconds.     Findings: Rash present.  Neurological:     General: No focal deficit present.     Mental Status: She is alert.     Sensory: No sensory deficit.     Motor: No weakness.  Psychiatric:        Mood and Affect: Mood normal.    ED Results / Procedures / Treatments   Labs (all labs ordered are listed, but only abnormal results are displayed) Labs Reviewed - No data to display  EKG None  Radiology No results found.  Procedures Procedures    Medications Ordered in ED Medications  sodium chloride 0.9 % bolus 1,000 mL (0 mLs Intravenous Stopped 03/23/21 2158)  famotidine (PEPCID) IVPB 20 mg premix (0 mg Intravenous Stopped 03/23/21 2129)    ED Course/ Medical Decision Making/ A&P                           Medical Decision Making  [name] is a 41 y.o. female with a past medical history significant for GERD, ulcerative colitis, hypertension, and anxiety who presents for anaphylactic reaction.  According to patient, she has a latex allergy but has never had a true anaphylactic reaction.  She says that today, she went inside at home and started having some itching and not feeling right.  She went outside and then when going back in the house the owner said that there were balloons in the home which started making the patient have a reaction.  She reported diffuse urticarial rash, nausea, lightheadedness, and sensation that her throat was closing.  Patient called EMS and was quickly given epinephrine, Solu-Medrol, and Benadryl.  She reports the rash is nearly resolved and her symptoms otherwise are all improved.  She is still feeling jittery likely  from the epinephrine but otherwise feels the reaction has significant proved.  No sensation that her throat is closing and she is feeling better.  On exam, no stridor.  No evidence of oropharyngeal swelling.  Lungs clear with no wheezing.  Chest and abdomen nontender.  No focal neurologic deficits initially.  Patient resting.  She has tachycardic and anxious.  Will give some fluids for the tachycardia.  The patient was given the medications at 1850.  I spoke with the family and they agree to at least a 4-hour monitor.  We will also give Pepcid to help give further antihistamine.  If patient passes p.o. challenge and is feeling better, dissipate discharge  home with steroid burst and prescription for EpiPen.  Anticipate reassessment after work-up.  Patient was observed for several hours and no recurrent allergic reaction symptoms.  Symptoms continue to improve and heart rate is also improved.  She reports that her heart rate is normally between 100-120 so she is back to her baseline.   Patient will be given prescription for steroids and epinephrine and will follow-up with PCP.  Patient and family agree with plan of care and patient discharged in good condition after over 4 hours of observation         Final Clinical Impression(s) / ED Diagnoses Final diagnoses:  Anaphylaxis, initial encounter    Rx / DC Orders ED Discharge Orders          Ordered    predniSONE (DELTASONE) 50 MG tablet  Daily        03/23/21 2318    EPINEPHrine 0.3 mg/0.3 mL IJ SOAJ injection  As needed        03/23/21 2318           Clinical Impression: 1. Anaphylaxis, initial encounter     Disposition: Discharge  Condition: Good  I have discussed the results, Dx and Tx plan with the pt(& family if present). He/she/they expressed understanding and agree(s) with the plan. Discharge instructions discussed at great length. Strict return precautions discussed and pt &/or family have verbalized understanding of  the instructions. No further questions at time of discharge.    Discharge Medication List as of 03/23/2021 11:19 PM     START taking these medications   Details  EPINEPHrine 0.3 mg/0.3 mL IJ SOAJ injection Inject 0.3 mg into the muscle as needed for anaphylaxis., Starting Sat 03/23/2021, Print    predniSONE (DELTASONE) 50 MG tablet Take 1 tablet (50 mg total) by mouth daily for 5 days., Starting Sun 03/24/2021, Until Fri 03/29/2021, Print        Follow Up: Orma Render, NP [address] [place] [address] [place] DEPT [place] [id number] [place]       Lavine Hargrove, Gwenyth Allegra, MD 03/23/21 [phone]

## 2021-03-28 NOTE — Progress Notes (Signed)
Chief Complaint  Patient presents with   Follow-up    Pt alone, rm 16.  Pt does not like using the trileptal, she had side effects with it. She stopped it 2 wks after taking it. She states she has not needed it      HISTORY OF PRESENT ILLNESS:  04/03/21 ALL:  Paige Hansen is a 41 y.o. female here today for follow up for occipital neuralgia. We continued gabapentin 100/200 and added oxcarbazepine 1101m BID in 11/2020. Since, she has discontinued oxcarb. She did not like side effects. Pain has been well managed on gabapentin alone. Occasionally she will take ibuprofen 8081m She has rare "zapping" pain.   11/27/20 ALL (Mychart): Paige Hansen a [age].o. female here today for follow up for headaches. She reports that she is having more frequent and intense electrical sensations of the right occipital region. She feels "zapping" occurs about 10 times a day and last for several seconds then resolves. Pain radiates to the top of her head and occasionally to her right eye. She continues gabapentin 10056mn the mornings and 200m58m bedtime. She can not tolerate higher doses due to sleepiness. Cyclobenzaprine was not helpful. Sleep study was normal. Last B12 was 268. She continues oral supplements.    05/29/20 ALL:  Paige EISINGERa 40 y63. female here today for follow up for headaches. She was seen by Dr AherJaynee Eagles12/2021 for dull headache in occipital region of her head with occasional feelings of being "zapped", mostly on right side. Headaches started following administration of 1st Covid vaccine. She was given occipital nerve block in the office that was helpful.  PT recommended for dry needling as she carried more stress in her neck and shoulders. She has had one session and feels headache may be a little worse. She feels that the intensity is worse.  Dr AherJaynee Eagleso started gabapentin 100mg24mto three times daily. She does not feel that it helps. Flexeril helps more than anything.  When she takes it at night, she wakes without a headache. B12 supplements recommended. She has not seen PCP for recheck. PCP moved and she is looking for a new provider. She feels that she is sleeping ok. She usually goes to bed aroun 10p. Wakes around 6a. She does snore. She wakes herself up snoring. She endorses a dry mouth. Most all headaches are present in the morning but unsure if they are present upon waking. She feels that she is tired during the day. She takes a nap every day around 1-3 if she is not working. Mom has sleep apnea but doesn't use CPAP.    REVIEW OF SYSTEMS: Out of a complete 14 system review of symptoms, the patient complains only of the following symptoms, head pain and all other reviewed systems are negative.   ALLERGIES: Allergies  Allergen Reactions   Latex Anaphylaxis    Required epipen for reaction on 03/23/21     HOME MEDICATIONS: Outpatient Medications Prior to Visit  Medication Sig Dispense Refill   EPINEPHrine 0.3 mg/0.3 mL IJ SOAJ injection Inject 0.3 mg into the muscle as needed for anaphylaxis. 1 each 0   metoprolol succinate (TOPROL-XL) 25 MG 24 hr tablet Take 1 tablet by mouth daily.     pantoprazole (PROTONIX) 40 MG tablet Take 1 tablet (40 mg total) by mouth daily. 30 tablet 1   phentermine 37.5 MG capsule take 1 capsule by mouth every morning 30 capsule 0  phentermine 37.5 MG capsule Take 1 capsule by mouth in the morning 30 capsule 0   vitamin B-12 (CYANOCOBALAMIN) 500 MCG tablet Take 500 mcg by mouth daily.     gabapentin (NEURONTIN) 100 MG capsule TAKE 1 CAPSULE BY MOUTH 3 (THREE) TIMES DAILY. 270 capsule 1   OXcarbazepine (TRILEPTAL) 150 MG tablet Take 1 tablet (150 mg total) by mouth 2 (two) times daily. (Patient not taking: Reported on 12/17/2020) 180 tablet 1   phentermine 37.5 MG capsule Take 1 capsule (37.5 mg total) by mouth every morning. 30 capsule 0   No facility-administered medications prior to visit.     PAST MEDICAL  HISTORY: Past Medical History:  Diagnosis Date   Abdominal pain, epigastric 06/24/2012   Allergy    Anxiety    Colitis    Depression    Post Partum    Diarrhea 06/24/2012   Frequent headaches    GERD (gastroesophageal reflux disease)    Hemorrhoids    Hypertension      PAST SURGICAL HISTORY: Past Surgical History:  Procedure Laterality Date   COLONOSCOPY     ESSURE TUBAL LIGATION  04/2009   WISDOM TOOTH EXTRACTION     1998     FAMILY HISTORY: Family History  Problem Relation Age of Onset   Diabetes Mother    Hyperlipidemia Mother    Hypertension Mother    Hypothyroidism Mother    Alcohol abuse Father    Stroke Father    Alcohol abuse Brother    Asthma Brother    Breast cancer Maternal Grandmother    Arthritis Maternal Grandmother    Hyperlipidemia Maternal Grandmother    Hypertension Maternal Grandmother    Kidney disease Maternal Grandmother    Arthritis Maternal Grandfather    Cancer - Other Maternal Grandfather        appendix   Diabetes Maternal Grandfather    Hearing loss Maternal Grandfather    Heart disease Maternal Grandfather    Hyperlipidemia Maternal Grandfather    Hypertension Maternal Grandfather    Kidney disease Maternal Grandfather    Heart attack Maternal Grandfather    Diabetes Paternal Grandmother    Heart disease Paternal Grandmother    Hypertension Paternal Grandmother    Diabetes Paternal Grandfather    Asthma Daughter    Intellectual disability Daughter    Colon cancer Neg Hx    Esophageal cancer Neg Hx    Rectal cancer Neg Hx    Stomach cancer Neg Hx    Colon polyps Neg Hx    Headache Neg Hx    Migraines Neg Hx      SOCIAL HISTORY: Social History   Socioeconomic History   Marital status: Married    Spouse name: Not on file   Number of children: 2   Years of education: Not on file   Highest education level: Not on file  Occupational History   Occupation: [employer]    Employer: [employer]  Tobacco Use   Smoking status:  Never   Smokeless tobacco: Never  Vaping Use   Vaping Use: Never used  Substance and Sexual Activity   Alcohol use: No   Drug use: No   Sexual activity: Yes    Birth control/protection: Other-see comments    Comment: essure implant   Other Topics Concern   Not on file  Social History Narrative   Lives at home with husband and children   Right handed   Caffeine: 2 cups/day   Social Determinants of Health  Financial Resource Strain: Not on file  Food Insecurity: Not on file  Transportation Needs: Not on file  Physical Activity: Not on file  Stress: Not on file  Social Connections: Not on file  Intimate Partner Violence: Not on file     PHYSICAL EXAM  Vitals:   04/03/21 0817  BP: 105/74  Pulse: 88  Weight: 189 lb (85.7 kg)  Height: 5' 3"  (1.6 m)   Body mass index is 33.48 kg/m.  Generalized: Well developed, in no acute distress  Cardiology: normal rate and rhythm, no murmur auscultated  Respiratory: clear to auscultation bilaterally    Neurological examination  Mentation: Alert oriented to time, place, history taking. Follows all commands speech and language fluent Cranial nerve II-XII: Pupils were equal round reactive to light. Extraocular movements were full, visual field were full on confrontational test. Facial sensation and strength were normal. Head turning and shoulder shrug  were normal and symmetric. Motor: The motor testing reveals 5 over 5 strength of all 4 extremities. Good symmetric motor tone is noted throughout.  Gait and station: Gait is normal.    DIAGNOSTIC DATA (LABS, IMAGING, TESTING) - I reviewed patient records, labs, notes, testing and imaging myself where available.  Lab Results  Component Value Date   WBC 8.6 11/30/2019   HGB 14.2 09/19/2020   HCT 40.5 11/30/2019   MCV 88.7 11/30/2019   PLT 236.0 11/30/2019      Component Value Date/Time   NA 138 11/30/2019 1116   K 3.7 11/30/2019 1116   CL 103 11/30/2019 1116   CO2 29  11/30/2019 1116   GLUCOSE 93 11/30/2019 1116   BUN 9 11/30/2019 1116   CREATININE 0.60 01/18/2020 1041   CALCIUM 9.1 11/30/2019 1116   PROT 6.5 11/30/2019 1116   ALBUMIN 4.4 11/30/2019 1116   AST 11 11/30/2019 1116   ALT 13 11/30/2019 1116   ALKPHOS 70 11/30/2019 1116   BILITOT 0.7 11/30/2019 1116   GFRNONAA >60 11/10/2010 1930   GFRAA >60 11/10/2010 1930   Lab Results  Component Value Date   CHOL 134 11/30/2019   HDL 39.60 11/30/2019   LDLCALC 77 11/30/2019   TRIG 88.0 11/30/2019   CHOLHDL 3 11/30/2019   Lab Results  Component Value Date   HGBA1C 5.3 11/30/2019   Lab Results  Component Value Date   JPETKKOE69 507 03/29/2021   Lab Results  Component Value Date   TSH 1.260 03/29/2021    No flowsheet data found.   No flowsheet data found.   ASSESSMENT AND PLAN  41 y.o. year old female  has a past medical history of Abdominal pain, epigastric (06/24/2012), Allergy, Anxiety, Colitis, Depression, Diarrhea (06/24/2012), Frequent headaches, GERD (gastroesophageal reflux disease), Hemorrhoids, and Hypertension. here with    Occipital neuralgia of right side  Paige Hansen is doing well on gabapentin 133m in am and 2080mat bedtime. We will continue current treatment plan. She did not tolerate oxcarb. May continue OTC analgesics as needed. Healthy lifestyle habits encouraged. She will follow up in 1 year, sooner if needed.    No orders of the defined types were placed in this encounter.    Meds ordered this encounter  Medications   gabapentin (NEURONTIN) 100 MG capsule    Sig: TAKE 1 CAPSULE BY MOUTH 3 (THREE) TIMES DAILY.    Dispense:  270 capsule    Refill:  3    Order Specific Question:   Supervising Provider    Answer:   AHMelvenia [id number]  Debbora Presto, MSN, FNP-C 04/03/2021, 9:17 AM  [facility] [address], [place],  [address] [phone]

## 2021-03-28 NOTE — Patient Instructions (Signed)
Below is our plan:  We will continue gabapentin 164m in the morning and 2053min the eventing.   Please make sure you are staying well hydrated. I recommend 50-60 ounces daily. Well balanced diet and regular exercise encouraged. Consistent sleep schedule with 6-8 hours recommended.   Please continue follow up with care team as directed.   Follow up with me in 1 year   You may receive a survey regarding today's visit. I encourage you to leave honest feed back as I do use this information to improve patient care. Thank you for seeing me today!

## 2021-03-29 ENCOUNTER — Ambulatory Visit (HOSPITAL_BASED_OUTPATIENT_CLINIC_OR_DEPARTMENT_OTHER): Payer: 59

## 2021-03-29 ENCOUNTER — Other Ambulatory Visit (HOSPITAL_BASED_OUTPATIENT_CLINIC_OR_DEPARTMENT_OTHER): Payer: Self-pay

## 2021-03-29 ENCOUNTER — Other Ambulatory Visit: Payer: Self-pay

## 2021-03-29 DIAGNOSIS — G4452 New daily persistent headache (NDPH): Secondary | ICD-10-CM

## 2021-03-29 DIAGNOSIS — E538 Deficiency of other specified B group vitamins: Secondary | ICD-10-CM

## 2021-03-29 DIAGNOSIS — I10 Essential (primary) hypertension: Secondary | ICD-10-CM | POA: Diagnosis not present

## 2021-03-29 DIAGNOSIS — E559 Vitamin D deficiency, unspecified: Secondary | ICD-10-CM

## 2021-03-30 LAB — B12 AND FOLATE PANEL
Folate: 12.2 ng/mL (ref >3.0)
Vitamin B-12: 288 pg/mL (ref 232–1245)

## 2021-03-30 LAB — TSH RFX ON ABNORMAL TO FREE T4: TSH: 1.26 u[IU]/mL (ref 0.450–4.500)

## 2021-04-03 ENCOUNTER — Encounter (HOSPITAL_BASED_OUTPATIENT_CLINIC_OR_DEPARTMENT_OTHER): Payer: Self-pay | Admitting: Nurse Practitioner

## 2021-04-03 ENCOUNTER — Encounter: Payer: Self-pay | Admitting: Family Medicine

## 2021-04-03 ENCOUNTER — Ambulatory Visit: Payer: 59 | Admitting: Family Medicine

## 2021-04-03 ENCOUNTER — Other Ambulatory Visit: Payer: Self-pay

## 2021-04-03 VITALS — BP 105/74 | HR 88 | Ht 63.0 in | Wt 189.0 lb

## 2021-04-03 DIAGNOSIS — M5481 Occipital neuralgia: Secondary | ICD-10-CM | POA: Diagnosis not present

## 2021-04-03 MED ORDER — GABAPENTIN 100 MG PO CAPS
ORAL_CAPSULE | ORAL | 3 refills | Status: DC
  Filled 2021-04-03: qty 270, 90d supply, fill #0
  Filled 2021-08-27: qty 270, 90d supply, fill #1
  Filled 2021-11-19: qty 270, 90d supply, fill #2

## 2021-04-05 ENCOUNTER — Other Ambulatory Visit: Payer: Self-pay

## 2021-04-05 ENCOUNTER — Encounter (HOSPITAL_BASED_OUTPATIENT_CLINIC_OR_DEPARTMENT_OTHER): Payer: Self-pay | Admitting: Nurse Practitioner

## 2021-04-05 ENCOUNTER — Ambulatory Visit (INDEPENDENT_AMBULATORY_CARE_PROVIDER_SITE_OTHER): Payer: 59 | Admitting: Nurse Practitioner

## 2021-04-05 ENCOUNTER — Ambulatory Visit (HOSPITAL_BASED_OUTPATIENT_CLINIC_OR_DEPARTMENT_OTHER): Payer: 59 | Admitting: Nurse Practitioner

## 2021-04-05 DIAGNOSIS — T65811A Toxic effect of latex, accidental (unintentional), initial encounter: Secondary | ICD-10-CM | POA: Diagnosis not present

## 2021-04-05 DIAGNOSIS — E538 Deficiency of other specified B group vitamins: Secondary | ICD-10-CM | POA: Diagnosis not present

## 2021-04-05 MED ORDER — EPINEPHRINE 0.3 MG/0.3ML IJ SOAJ
0.3000 mg | INTRAMUSCULAR | 99 refills | Status: DC | PRN
  Filled 2021-04-05: qty 2, 2d supply, fill #0
  Filled 2021-04-08: qty 2, 2d supply, fill #1

## 2021-04-05 MED ORDER — PREDNISONE 20 MG PO TABS
20.0000 mg | ORAL_TABLET | Freq: Every day | ORAL | 0 refills | Status: DC
  Filled 2021-04-05: qty 5, 5d supply, fill #0

## 2021-04-05 MED ORDER — FAMOTIDINE 40 MG PO TABS
40.0000 mg | ORAL_TABLET | Freq: Every day | ORAL | 3 refills | Status: DC
  Filled 2021-04-05: qty 90, 90d supply, fill #0
  Filled 2021-10-24: qty 90, 90d supply, fill #1

## 2021-04-05 MED ORDER — BUDESONIDE-FORMOTEROL FUMARATE 80-4.5 MCG/ACT IN AERO
2.0000 | INHALATION_SPRAY | Freq: Two times a day (BID) | RESPIRATORY_TRACT | 12 refills | Status: DC
  Filled 2021-04-05: qty 10.2, 30d supply, fill #0

## 2021-04-05 MED ORDER — CYANOCOBALAMIN 1000 MCG/ML IJ SOLN
1000.0000 ug | INTRAMUSCULAR | 3 refills | Status: DC
  Filled 2021-04-05: qty 3, 90d supply, fill #0

## 2021-04-05 NOTE — Patient Instructions (Signed)
Be sure you are carrying your epi-pen with you at all times. You also want to make sure that you are carrying benadryl and pepcid with you at all times.   If you begin to have the beginning of allergic symptoms I want you to take 22m of benadryl and 468mof pepcid immediately if you can swallow safely. Use the epi-pen if you start to have shortness of breath or worsening symptoms.

## 2021-04-05 NOTE — Progress Notes (Signed)
Virtual Visit Encounter  telephone visit.   I connected with  Paige Hansen on 04/05/21 at 10:10 AM EST by secure audio and/or video enabled telemedicine application. I verified that I am speaking with the correct person using two identifiers.   I introduced myself as a Designer, jewellery with the practice. The limitations of evaluation and management by telemedicine discussed with the patient and the availability of in person appointments. The patient expressed verbal understanding and consent to proceed.  Participating parties in this visit include: Myself and patient  The patient is: Patient Location: Home I am: Provider Location: Office/Clinic Subjective:    CC and HPI: Paige Hansen is a 41 y.o. year old female presenting for follow up of anaphylaxis to latex.  She tells me she went to a friends house and noticed she started getting a scratchy, itchy throat, and hives on her neck shortly after entering the home. She discovered the person had balloons in the home and one had popped recently. She immediately left the home and continued to have symptoms, although they were not worsening. She went back inside the house for a brief period and her symptoms began to immediately worsen and she also experienced flushing, nausea, coughing, and difficulty breathing. She progressed to the point that she was unable to speak and passing air was difficult for her even after leaving the home. EMS was contacted and she was given epi pen, solumedrol, benadryl, and pepcid. Her symptoms began to improve by the time she reached the hospital and she was evaluated. She was provided with a prescription for an epi-pen and told to follow-up with her PCP.   She tells me a few days later she was eating a banana and started to once again experience hives, itching, flushing, and scratchy throat. She tells me this episode was not as bad as the previous encounter, but she could tell it was similar. She has never had  this experience with bananas in the past.  She endorses a known sensitivity to latex in the past but never to the this extent. She is concerned over future exposures and the effect this may have on her. 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 She would like to see if she can get the Auvi-q epi-pen for ease of keeping with her at all times, particularly on the job when a larger traditional epi-pen would be difficult to carry.    She has a history of vitamin B12 deficiency and has been taking 1078mg oral replacement for this. Recent labs with show that her levels are in the low normal range. It was recommended by Dr. LUbaldo Glassingthat she keep these levels at a minimum of 500 for optimal control of symptoms. She is interested in B12 injection replacement.   Past medical history, Surgical history, Family history not pertinant except as noted below, Social history, Allergies, and medications have been entered into the medical record, reviewed, and corrections made.   Review of Systems:  All review of systems negative except what is listed in the HPI  Objective:    Alert and oriented x 4 Speaking in clear sentences with no shortness of breath. No distress.  Impression and Recommendations:    Problem List Items Addressed This Visit     B12 deficiency    B12 deficiency not to goal with oral replacement.  Will begin injectable therapy on every 30 day basis for improved control  Will  plan to recheck labs in 8-12 weeks for monitoring of therapeutic effect of injection      Relevant Medications   cyanocobalamin (,VITAMIN B-12,) 1000 MCG/ML injection   Other Relevant Orders   B12 and Folate Panel   Anaphylaxis due to latex - Primary    Anaphylactic event with exposure to latex and near anaphylactic event with banana.  Symptoms of event consistent with severe allergy to latex and latex similar foods.  Recommendations for avoidance of foods with  handout provided on AVS.  Recommendations for keeping epi-pen on her at all times in addition to benadryl and pepcid.  Recommend 21m of benadryl and 447mof pepcid for use at first sign of allergic symptoms if able to swallow. If throat swelling or difficulty breathing are present recommend epi-pen use first.  Will send symbicort inhaler for residual wheezing, likely related to allergic response. Recommend BID use for at least two weeks. She may need to continue if she continues to have symptoms. Will also send prednisone burst for residual inflammation she is experiencing. Likely result of two reactions in close proximity.  Will send referral to allergy/asthma specialist with pulmonology for patient for further testing and evaluation. Appreciate their input on continued inhaler use.  Sending auvi-q epinephrine for her to have with her at all times. 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  She understandings to seek emergency care if her symptoms present again. She also understand medication administration and keeping this on her at all times.         Relevant Medications   EPINEPHrine (AUVI-Q) 0.3 mg/0.3 mL IJ SOAJ injection   predniSONE (DELTASONE) 20 MG tablet   budesonide-formoterol (SYMBICORT) 80-4.5 MCG/ACT inhaler   famotidine (PEPCID) 40 MG tablet    orders and follow up as documented in EMR I discussed the assessment and treatment plan with the patient. The patient was provided an opportunity to ask questions and all were answered. The patient agreed with the plan and demonstrated an understanding of the instructions.   The patient was advised to call back or seek an in-person evaluation if the symptoms worsen or if the condition fails to improve as anticipated.  Follow-Up: prn  I provided 26 minutes of  non-face-to-face interaction with this non face-to-face encounter including intake, same-day documentation, and chart review.   SaOrma RenderNP , DNP, AGNP-c [place] [phone])

## 2021-04-05 NOTE — Assessment & Plan Note (Signed)
B12 deficiency not to goal with oral replacement.  Will begin injectable therapy on every 30 day basis for improved control  Will plan to recheck labs in 8-12 weeks for monitoring of therapeutic effect of injection

## 2021-04-05 NOTE — Assessment & Plan Note (Signed)
Anaphylactic event with exposure to latex and near anaphylactic event with banana.  Symptoms of event consistent with severe allergy to latex and latex similar foods.  Recommendations for avoidance of foods with handout provided on AVS.  Recommendations for keeping epi-pen on her at all times in addition to benadryl and pepcid.  Recommend 52m of benadryl and 433mof pepcid for use at first sign of allergic symptoms if able to swallow. If throat swelling or difficulty breathing are present recommend epi-pen use first.  Will send symbicort inhaler for residual wheezing, likely related to allergic response. Recommend BID use for at least two weeks. She may need to continue if she continues to have symptoms. Will also send prednisone burst for residual inflammation she is experiencing. Likely result of two reactions in close proximity.  Will send referral to allergy/asthma specialist with pulmonology for patient for further testing and evaluation. Appreciate their input on continued inhaler use.  Sending auvi-q epinephrine for her to have with her at all times. 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  She understandings to seek emergency care if her symptoms present again. She also understand medication administration and keeping this on her at all times.

## 2021-04-08 ENCOUNTER — Other Ambulatory Visit: Payer: Self-pay

## 2021-04-15 ENCOUNTER — Encounter (HOSPITAL_BASED_OUTPATIENT_CLINIC_OR_DEPARTMENT_OTHER): Payer: Self-pay | Admitting: Nurse Practitioner

## 2021-04-16 ENCOUNTER — Other Ambulatory Visit: Payer: Self-pay

## 2021-05-30 ENCOUNTER — Other Ambulatory Visit: Payer: Self-pay

## 2021-05-30 ENCOUNTER — Other Ambulatory Visit: Payer: Self-pay | Admitting: Physician Assistant

## 2021-05-30 MED ORDER — PANTOPRAZOLE SODIUM 40 MG PO TBEC
40.0000 mg | DELAYED_RELEASE_TABLET | Freq: Every day | ORAL | 1 refills | Status: DC
  Filled 2021-05-30: qty 30, 30d supply, fill #0
  Filled 2021-07-17: qty 30, 30d supply, fill #1

## 2021-06-03 ENCOUNTER — Ambulatory Visit (INDEPENDENT_AMBULATORY_CARE_PROVIDER_SITE_OTHER): Payer: 59 | Admitting: Pulmonary Disease

## 2021-06-03 ENCOUNTER — Encounter: Payer: Self-pay | Admitting: Pulmonary Disease

## 2021-06-03 ENCOUNTER — Ambulatory Visit: Payer: 59 | Admitting: Pulmonary Disease

## 2021-06-03 ENCOUNTER — Other Ambulatory Visit: Payer: Self-pay | Admitting: Pulmonary Disease

## 2021-06-03 ENCOUNTER — Other Ambulatory Visit: Payer: Self-pay

## 2021-06-03 ENCOUNTER — Ambulatory Visit (INDEPENDENT_AMBULATORY_CARE_PROVIDER_SITE_OTHER): Payer: 59

## 2021-06-03 VITALS — BP 118/72 | HR 96 | Ht 63.0 in | Wt 193.0 lb

## 2021-06-03 DIAGNOSIS — R0609 Other forms of dyspnea: Secondary | ICD-10-CM | POA: Diagnosis not present

## 2021-06-03 DIAGNOSIS — R053 Chronic cough: Secondary | ICD-10-CM | POA: Diagnosis not present

## 2021-06-03 DIAGNOSIS — R0982 Postnasal drip: Secondary | ICD-10-CM

## 2021-06-03 LAB — PULMONARY FUNCTION TEST
DL/VA % pred: 114 %
DL/VA: 5.08 ml/min/mmHg/L
DLCO cor % pred: 118 %
DLCO cor: 24.85 ml/min/mmHg
DLCO unc % pred: 118 %
DLCO unc: 24.85 ml/min/mmHg
FEF 25-75 Post: 3.7 L/sec
FEF 25-75 Pre: 3.58 L/sec
FEF2575-%Change-Post: 3 %
FEF2575-%Pred-Post: 124 %
FEF2575-%Pred-Pre: 120 %
FEV1-%Change-Post: 1 %
FEV1-%Pred-Post: 105 %
FEV1-%Pred-Pre: 103 %
FEV1-Post: 3.01 L
FEV1-Pre: 2.96 L
FEV1FVC-%Change-Post: 1 %
FEV1FVC-%Pred-Pre: 104 %
FEV6-%Change-Post: 0 %
FEV6-%Pred-Post: 100 %
FEV6-%Pred-Pre: 99 %
FEV6-Post: 3.47 L
FEV6-Pre: 3.45 L
FEV6FVC-%Pred-Post: 102 %
FEV6FVC-%Pred-Pre: 102 %
FVC-%Change-Post: 0 %
FVC-%Pred-Post: 98 %
FVC-%Pred-Pre: 97 %
FVC-Post: 3.47 L
FVC-Pre: 3.45 L
Post FEV1/FVC ratio: 87 %
Post FEV6/FVC ratio: 100 %
Pre FEV1/FVC ratio: 86 %
Pre FEV6/FVC Ratio: 100 %
RV % pred: 97 %
RV: 1.56 L
TLC % pred: 101 %
TLC: 4.97 L

## 2021-06-03 LAB — CBC WITH DIFFERENTIAL/PLATELET
Basophils Absolute: 0 10*3/uL (ref 0.0–0.1)
Basophils Relative: 0.3 % (ref 0.0–3.0)
Eosinophils Absolute: 0.1 10*3/uL (ref 0.0–0.7)
Eosinophils Relative: 1.1 % (ref 0.0–5.0)
HCT: 40.5 % (ref 36.0–46.0)
Hemoglobin: 13.6 g/dL (ref 12.0–15.0)
Lymphocytes Relative: 28.7 % (ref 12.0–46.0)
Lymphs Abs: 2.1 10*3/uL (ref 0.7–4.0)
MCHC: 33.6 g/dL (ref 30.0–36.0)
MCV: 89.3 fl (ref 78.0–100.0)
Monocytes Absolute: 0.5 10*3/uL (ref 0.1–1.0)
Monocytes Relative: 6.8 % (ref 3.0–12.0)
Neutro Abs: 4.7 10*3/uL (ref 1.4–7.7)
Neutrophils Relative %: 63.1 % (ref 43.0–77.0)
Platelets: 256 10*3/uL (ref 150.0–400.0)
RBC: 4.53 Mil/uL (ref 3.87–5.11)
RDW: 12.6 % (ref 11.5–15.5)
WBC: 7.4 10*3/uL (ref 4.0–10.5)

## 2021-06-03 MED ORDER — FLUTICASONE PROPIONATE 50 MCG/ACT NA SUSP
2.0000 | Freq: Every day | NASAL | 2 refills | Status: DC
  Filled 2021-06-03: qty 16, 30d supply, fill #0
  Filled 2022-05-13: qty 16, 30d supply, fill #1

## 2021-06-03 MED ORDER — IPRATROPIUM BROMIDE 0.03 % NA SOLN
2.0000 | Freq: Two times a day (BID) | NASAL | 12 refills | Status: DC
  Filled 2021-06-03: qty 30, 75d supply, fill #0

## 2021-06-03 NOTE — Patient Instructions (Addendum)
Start fluticasone nasal spray, 2 spray per nostril daily ? ?Start ipratropium nasal spray, 2 sprays per nostril twice daily ? ?Continue zyrtec daily ? ?Continue pantoprazole and protonix for reflux disease ? ?We will check labs and a chest x-ray today ? ?Follow up in 4 weeks via video visit ? ? ?

## 2021-06-03 NOTE — Progress Notes (Signed)
? ?Synopsis: Referred in March 2023 for cough by Jacolyn Reedy, NP ? ?Subjective:  ? ?PATIENT ID: Paige Hansen GENDER: female DOB: November 03, 1980, MRN: [id number] ? ?HPI ? ?Chief Complaint  ?Patient presents with  ? Consult  ?  Referred by PCP for chronic cough for the past 3 months. Productive cough with greenish phlegm. Increased SOB. Has a sharp pain in the middle of her back after taking a deep breath.   ? ?Paige Hansen is a 41 year old woman, never smoker with GERD, hypertension and latex allergy who is referred to pulmonary clinic for cough.  ? ?She reports developing cough after 03/11/20 when she had an upper respiratory viral infection with fevers, shortness of breath and difficulty breathing. She tested negative for COVID with 2 separate home tests. The cough is productive at times with greenish phlegm. She denies wheezing. She denies issues with GERD as she is taking protonix 30 minutes before breakfast and pepcid at bedtime. She has been sleeping with the head of the bed elevated for 6 months. She is unsure if she is having post-nasal drainage. Her throat feels irritated and is more uncomfortable in the evening.  ? ?She does not report issues with allergies, although she is taking zyrtec for her latex allergy. She denies issues with her breathing in cold air or when exposed to colognes or perfumes. ? ?Physical activity can aggravate the cough.  ? ?She is a never smoker. She reports second hand smoke exposure in childhood. She has 2 dogs at home and has chickens she feeds daily. 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 Her mother likely has COPD and she has a maternal aunt with COPD and lung cancer. She works as a Chartered certified accountant. She has a latex allergy that has been more active since her viral infection. She has an appointment with allergy/immunology coming up. ? ?Past Medical History:  ?Diagnosis Date  ? Abdominal pain, epigastric 06/24/2012  ? Allergy   ? Anxiety   ? Colitis   ? Depression   ? Post Partum   ?  Diarrhea 06/24/2012  ? Frequent headaches   ? GERD (gastroesophageal reflux disease)   ? Hemorrhoids   ? Hypertension   ?  ? ?Family History  ?Problem Relation Age of Onset  ? Diabetes Mother   ? Hyperlipidemia Mother   ? Hypertension Mother   ? Hypothyroidism Mother   ? Alcohol abuse Father   ? Stroke Father   ? Alcohol abuse Brother   ? Asthma Brother   ? Breast cancer Maternal Grandmother   ? Arthritis Maternal Grandmother   ? Hyperlipidemia Maternal Grandmother   ? Hypertension Maternal Grandmother   ? Kidney disease Maternal Grandmother   ? Arthritis Maternal Grandfather   ? Cancer - Other Maternal Grandfather   ?     appendix  ? Diabetes Maternal Grandfather   ? Hearing loss Maternal Grandfather   ? Heart disease Maternal Grandfather   ? Hyperlipidemia Maternal Grandfather   ? Hypertension Maternal Grandfather   ? Kidney disease Maternal Grandfather   ? Heart attack Maternal Grandfather   ? Diabetes Paternal Grandmother   ? Heart disease Paternal Grandmother   ? Hypertension Paternal Grandmother   ? Diabetes Paternal Grandfather   ? Asthma Daughter   ? Intellectual disability Daughter   ? Colon cancer Neg Hx   ? Esophageal cancer Neg Hx   ? Rectal cancer Neg Hx   ? Stomach cancer Neg Hx   ? Colon polyps  Neg Hx   ? Headache Neg Hx   ? Migraines Neg Hx   ?  ? ?Social History  ? ?Socioeconomic History  ? Marital status: Married  ?  Spouse name: Not on file  ? Number of children: 2  ? Years of education: Not on file  ? Highest education level: Not on file  ?Occupational History  ? Occupation: [employer]  ?  Employer: [employer]  ?Tobacco Use  ? Smoking status: Never  ? Smokeless tobacco: Never  ?Vaping Use  ? Vaping Use: Never used  ?Substance and Sexual Activity  ? Alcohol use: No  ? Drug use: No  ? Sexual activity: Yes  ?  Birth control/protection: Other-see comments  ?  Comment: essure implant   ?Other Topics Concern  ? Not on file  ?Social History Narrative  ? Lives at home with husband and children  ? Right handed  ?  Caffeine: 2 cups/day  ? ?Social Determinants of Health  ? ?Financial Resource Strain: Not on file  ?Food Insecurity: Not on file  ?Transportation Needs: Not on file  ?Physical Activity: Not on file  ?Stress: Not on file  ?Social Connections: Not on file  ?Intimate Partner Violence: Not on file  ?  ? ?Allergies  ?Allergen Reactions  ? Latex Anaphylaxis  ?  Required epipen for reaction on 03/23/21  ?  ? ?Outpatient Medications Prior to Visit  ?Medication Sig Dispense Refill  ? cetirizine (ZYRTEC) 10 MG tablet Take 10 mg by mouth daily.    ? cyanocobalamin (,VITAMIN B-12,) 1000 MCG/ML injection Inject 1 mL (1,000 mcg total) into the muscle every 30 (thirty) days. Please include needle and syringe for IM injection. 10 mL 3  ? EPINEPHrine (AUVI-Q) 0.3 mg/0.3 mL IJ SOAJ injection Inject 0.3 mg into the muscle as needed for anaphylaxis. Repeat dose x1 for anaphylaxis symptoms if not improved after 15 minutes with first dose. 2 each PRN  ? EPINEPHrine 0.3 mg/0.3 mL IJ SOAJ injection Inject 0.3 mg into the muscle as needed for anaphylaxis. 1 each 0  ? famotidine (PEPCID) 40 MG tablet Take 1 tablet (40 mg total) by mouth at bedtime. 90 tablet 3  ? gabapentin (NEURONTIN) 100 MG capsule TAKE 1 CAPSULE BY MOUTH 3 (THREE) TIMES DAILY. 270 capsule 3  ? metoprolol succinate (TOPROL-XL) 25 MG 24 hr tablet Take 1 tablet by mouth daily.    ? pantoprazole (PROTONIX) 40 MG tablet Take 1 tablet (40 mg total) by mouth daily. 30 tablet 1  ? phentermine 37.5 MG capsule take 1 capsule by mouth every morning 30 capsule 0  ? phentermine 37.5 MG capsule Take 1 capsule by mouth in the morning 30 capsule 0  ? budesonide-formoterol (SYMBICORT) 80-4.5 MCG/ACT inhaler Inhale 2 puffs into the lungs in the morning and at bedtime. 10.2 g 12  ? predniSONE (DELTASONE) 20 MG tablet Take 1 tablet (20 mg total) by mouth daily with breakfast. 5 tablet 0  ? ?No facility-administered medications prior to visit.  ? ?Review of Systems  ?Constitutional:  Negative  for chills, fever, malaise/fatigue and weight loss.  ?HENT:  Positive for sore throat. Negative for congestion and sinus pain.   ?Eyes: Negative.   ?Respiratory:  Positive for cough. Negative for hemoptysis, sputum production, shortness of breath and wheezing.   ?Cardiovascular:  Negative for chest pain, palpitations, orthopnea, claudication and leg swelling.  ?Gastrointestinal:  Negative for abdominal pain, heartburn, nausea and vomiting.  ?Genitourinary: Negative.   ?Musculoskeletal:  Negative for joint pain  and myalgias.  ?Skin:  Negative for rash.  ?Neurological:  Negative for weakness.  ?Endo/Heme/Allergies: Negative.   ?Psychiatric/Behavioral: Negative.    ? ?Objective:  ? ?Vitals:  ? 06/03/21 1359  ?BP: 118/72  ?Pulse: 96  ?SpO2: 99%  ?Weight: 193 lb (87.5 kg)  ?Height: 5' 3"  (1.6 m)  ? ?Physical Exam ?Constitutional:   ?   General: She is not in acute distress. ?   Appearance: She is not ill-appearing.  ?HENT:  ?   Head: Normocephalic and atraumatic.  ?Eyes:  ?   General: No scleral icterus. ?   Conjunctiva/sclera: Conjunctivae normal.  ?   Pupils: Pupils are equal, round, and reactive to light.  ?Cardiovascular:  ?   Rate and Rhythm: Normal rate and regular rhythm.  ?   Pulses: Normal pulses.  ?   Heart sounds: Normal heart sounds. No murmur heard. ?Pulmonary:  ?   Effort: Pulmonary effort is normal.  ?   Breath sounds: Normal breath sounds. No wheezing, rhonchi or rales.  ?Abdominal:  ?   General: Bowel sounds are normal.  ?   Palpations: Abdomen is soft.  ?Musculoskeletal:  ?   Right lower leg: No edema.  ?   Left lower leg: No edema.  ?Lymphadenopathy:  ?   Cervical: No cervical adenopathy.  ?Skin: ?   General: Skin is warm and dry.  ?Neurological:  ?   General: No focal deficit present.  ?   Mental Status: She is alert.  ?Psychiatric:     ?   Mood and Affect: Mood normal.     ?   Behavior: Behavior normal.     ?   Thought Content: Thought content normal.     ?   Judgment: Judgment normal.  ? ?CBC ?    ?Component Value Date/Time  ? WBC 7.4 06/03/2021 1438  ? RBC 4.53 06/03/2021 1438  ? HGB 13.6 06/03/2021 1438  ? HCT 40.5 06/03/2021 1438  ? PLT 256.0 06/03/2021 1438  ? MCV 89.3 06/03/2021 1438  ? MCH 30

## 2021-06-03 NOTE — Progress Notes (Signed)
PFT done today. 

## 2021-06-04 LAB — RESPIRATORY ALLERGY PROFILE REGION II ~~LOC~~
Allergen, A. alternata, m6: <0.1 kU/L
Allergen, Cedar tree, t12: 0.13 kU/L — ABNORMAL HIGH
Allergen, Comm Silver Birch, t9: <0.1 kU/L
Allergen, Cottonwood, t14: 0.11 kU/L — ABNORMAL HIGH
Allergen, D pternoyssinus,d7: <0.1 kU/L
Allergen, Mouse Urine Protein, e78: <0.1 kU/L
Allergen, Mulberry, t76: <0.1 kU/L
Allergen, Oak,t7: 0.17 kU/L — ABNORMAL HIGH
Allergen, P. notatum, m1: <0.1 kU/L
Aspergillus fumigatus, m3: <0.1 kU/L
Bermuda Grass: 0.23 kU/L — ABNORMAL HIGH
Box Elder IgE: 0.15 kU/L — ABNORMAL HIGH
CLADOSPORIUM HERBARUM (M2) IGE: <0.1 kU/L
COMMON RAGWEED (SHORT) (W1) IGE: 0.18 kU/L — ABNORMAL HIGH
Cat Dander: <0.1 kU/L
Class: 0
Class: 0
Class: 0
Class: 0
Class: 0
Class: 0
Class: 0
Class: 0
Class: 0
Class: 0
Cockroach: 0.14 kU/L — ABNORMAL HIGH
D. farinae: <0.1 kU/L
Dog Dander: 0.21 kU/L — ABNORMAL HIGH
Elm IgE: 0.15 kU/L — ABNORMAL HIGH
IgE (Immunoglobulin E), Serum: 28 kU/L (ref <=114)
Johnson Grass: 0.22 kU/L — ABNORMAL HIGH
Pecan/Hickory Tree IgE: 0.11 kU/L — ABNORMAL HIGH
Rough Pigweed  IgE: 0.1 kU/L — ABNORMAL HIGH
Sheep Sorrel IgE: 0.22 kU/L — ABNORMAL HIGH
Timothy Grass: 0.19 kU/L — ABNORMAL HIGH

## 2021-06-04 LAB — INTERPRETATION:

## 2021-06-05 ENCOUNTER — Institutional Professional Consult (permissible substitution): Payer: 59 | Admitting: Pulmonary Disease

## 2021-06-06 ENCOUNTER — Encounter: Payer: Self-pay | Admitting: Pulmonary Disease

## 2021-06-24 ENCOUNTER — Other Ambulatory Visit: Payer: Self-pay

## 2021-06-24 DIAGNOSIS — Z91040 Latex allergy status: Secondary | ICD-10-CM | POA: Diagnosis not present

## 2021-06-24 DIAGNOSIS — R053 Chronic cough: Secondary | ICD-10-CM | POA: Diagnosis not present

## 2021-06-24 DIAGNOSIS — J3089 Other allergic rhinitis: Secondary | ICD-10-CM | POA: Diagnosis not present

## 2021-06-24 MED ORDER — ALBUTEROL SULFATE HFA 108 (90 BASE) MCG/ACT IN AERS
INHALATION_SPRAY | RESPIRATORY_TRACT | 0 refills | Status: DC
  Filled 2021-06-24: qty 6.7, 25d supply, fill #0

## 2021-06-27 DIAGNOSIS — Z91040 Latex allergy status: Secondary | ICD-10-CM | POA: Diagnosis not present

## 2021-07-17 ENCOUNTER — Other Ambulatory Visit: Payer: Self-pay

## 2021-07-17 MED ORDER — COVID-19 AT HOME ANTIGEN TEST VI KIT
PACK | 0 refills | Status: DC
  Filled 2021-07-17: qty 2, 4d supply, fill #0

## 2021-07-19 ENCOUNTER — Other Ambulatory Visit (HOSPITAL_COMMUNITY): Payer: Self-pay

## 2021-07-19 ENCOUNTER — Other Ambulatory Visit: Payer: Self-pay

## 2021-07-22 ENCOUNTER — Other Ambulatory Visit: Payer: Self-pay

## 2021-07-22 ENCOUNTER — Telehealth (INDEPENDENT_AMBULATORY_CARE_PROVIDER_SITE_OTHER): Payer: 59 | Admitting: Pulmonary Disease

## 2021-07-22 DIAGNOSIS — R0982 Postnasal drip: Secondary | ICD-10-CM

## 2021-07-22 DIAGNOSIS — J4521 Mild intermittent asthma with (acute) exacerbation: Secondary | ICD-10-CM | POA: Diagnosis not present

## 2021-07-22 MED ORDER — BUDESONIDE-FORMOTEROL FUMARATE 80-4.5 MCG/ACT IN AERO
2.0000 | INHALATION_SPRAY | Freq: Two times a day (BID) | RESPIRATORY_TRACT | 12 refills | Status: DC
Start: 2021-07-22 — End: 2022-01-08
  Filled 2021-07-22 – 2021-08-27 (×2): qty 10.2, 30d supply, fill #0

## 2021-07-22 NOTE — Patient Instructions (Addendum)
Stop using ipratropium nasal spray over the next two weeks. If cough and sinus congestion do not return then ok to stop fonase nasal spray in 2 weeks as well. If symptoms returns, then use nasal sprays as needed. ? ?Resume symbicort 80-4.49mg 2 puffs twice daily.  ? ?Use symbicort as needed 1-2 puffs every 4-6 hours for cough, wheezing chest tightness or shortness of breath. If still need albuterol on a daily basis after starting symbicort for 2-3 weeks, please let u[place]now. ? ?Follow up in 6 months ?

## 2021-07-22 NOTE — Progress Notes (Signed)
Virtual Visit via Video Note  I connected with ZIRA HELINSKI on 07/22/21 at 10:45 AM EDT by a video enabled telemedicine application and verified that I am speaking with the correct person using two identifiers.  Location: Patient: home Provider: clinic   I discussed the limitations of evaluation and management by telemedicine and the availability of in person appointments. The patient expressed understanding and agreed to proceed.  History of Present Illness: Paige Hansen is a 41 year old woman, never smoker with GERD, hypertension and latex allergy who returns to pulmonary clinic for cough.    She was started on fluticasone nasal spray and ipratropium nasal spray for post-nasal drip leading to her cough.   She reports improvement in her nasal congestion and drainage and also in her cough.   She stopped using the symbicort inhaler which did seem to help with her cough as well.  Observations/Objective: No acute distress Talking in full sentences  Assessment and Plan: Mild intermittent reactive airways disease Post-nasal drip  She is to continue symbicort use as needed for her cough She can wean off ipratropium nasal spary over the next two weeks followed by fluticasone nasal spray if her post-nasal drainage symptoms do not return. She should return to the nasal sprays if symptoms return.  Follow Up Instructions: Follow up in 6 months.    I discussed the assessment and treatment plan with the patient. The patient was provided an opportunity to ask questions and all were answered. The patient agreed with the plan and demonstrated an understanding of the instructions.   The patient was advised to call back or seek an in-person evaluation if the symptoms worsen or if the condition fails to improve as anticipated.  I provided 20 minutes of non-face-to-face time during this encounter.   Freddi Starr, MD

## 2021-07-30 ENCOUNTER — Encounter: Payer: Self-pay | Admitting: Pulmonary Disease

## 2021-08-16 ENCOUNTER — Other Ambulatory Visit: Payer: Self-pay

## 2021-08-27 ENCOUNTER — Other Ambulatory Visit: Payer: Self-pay | Admitting: Physician Assistant

## 2021-08-27 ENCOUNTER — Other Ambulatory Visit: Payer: Self-pay

## 2021-08-27 MED ORDER — PANTOPRAZOLE SODIUM 40 MG PO TBEC
40.0000 mg | DELAYED_RELEASE_TABLET | Freq: Every day | ORAL | 1 refills | Status: DC
  Filled 2021-08-27: qty 30, 30d supply, fill #0
  Filled 2021-10-24: qty 30, 30d supply, fill #1

## 2021-10-24 ENCOUNTER — Other Ambulatory Visit: Payer: Self-pay

## 2021-10-24 ENCOUNTER — Other Ambulatory Visit (HOSPITAL_BASED_OUTPATIENT_CLINIC_OR_DEPARTMENT_OTHER): Payer: Self-pay | Admitting: Nurse Practitioner

## 2021-10-24 DIAGNOSIS — I10 Essential (primary) hypertension: Secondary | ICD-10-CM

## 2021-10-25 ENCOUNTER — Other Ambulatory Visit: Payer: Self-pay

## 2021-10-25 MED ORDER — METOPROLOL SUCCINATE ER 25 MG PO TB24
50.0000 mg | ORAL_TABLET | Freq: Every day | ORAL | 11 refills | Status: DC
  Filled 2021-10-25: qty 60, 30d supply, fill #0

## 2021-11-14 ENCOUNTER — Encounter (HOSPITAL_BASED_OUTPATIENT_CLINIC_OR_DEPARTMENT_OTHER): Payer: Self-pay | Admitting: Nurse Practitioner

## 2021-11-14 ENCOUNTER — Ambulatory Visit (HOSPITAL_BASED_OUTPATIENT_CLINIC_OR_DEPARTMENT_OTHER): Payer: 59 | Admitting: Nurse Practitioner

## 2021-11-14 VITALS — BP 120/78 | HR 97 | Ht 62.0 in | Wt 211.6 lb

## 2021-11-14 DIAGNOSIS — R0602 Shortness of breath: Secondary | ICD-10-CM

## 2021-11-14 DIAGNOSIS — R000 Tachycardia, unspecified: Secondary | ICD-10-CM

## 2021-11-14 DIAGNOSIS — R072 Precordial pain: Secondary | ICD-10-CM

## 2021-11-14 NOTE — Assessment & Plan Note (Signed)
Shortness of breath in the setting of intermittent tachycardia of unknown etiology.  She is asymptomatic at this time and oxygen saturations are 100%.  Heart rate and rhythm are regular in the office today.  Given the intermittent nature and the significance of the symptoms she is experiencing I do feel urgent referral to cardiology is warranted.  We will obtain labs today for further evaluation and send referral.

## 2021-11-14 NOTE — Progress Notes (Signed)
Orma Render, DNP, AGNP-c [facility] [address]  [place], [place] [address] [phone]  Subjective:   Paige Hansen is a 41 y.o. female presents to day for evaluation of: Acute Visit (Patient is having palpation and high heart rate x 2 months. She would like a referral to Dr Oval Linsey. )  Tachycardia/Palpitations Episodes of elevated HR for 2-3 months She tells me when this first started, she can feel her heart race and then have the sensation to cough.  After the cough her heart rate would return to normal She tells me that shortly thereafter the symptoms started to appear after every meal-it did not matter what was eaten She reports now the symptoms of palpitations and heart racing are noted with any kind of physical activity, even moderate activity like walking. She endorses a sensation in her throat and heaviness in her chest at the onset of palpitations She tells me that she can feel a flutter in her throat She reports that her heart rate will be regular and suddenly spike up to 1 50-1 70 without any warning or provocation and then return to normal with no consistency in length of time with tachycardias present She tells me that yesterday while she was at work she noticed elevation in her heart rate at approximately 1:15 in the afternoon and this lasted until after 7 PM.  She was able to connect herself to the pulse oximeter at work and her heart rate was noted to be up to 195.  She reports that her heart rate will fluctuate up and down She reports at times she feels like she is hurting through her chest with radiation into her shoulders and her back She tells me she feels that she is getting more symptomatic with each event She is currently on metoprolol 25 mg XR.  She tells me that she did take an extra metoprolol during 1 occurrence and her heart rate did decrease, however recently when she had the exacerbation at work she did  take an extra metoprolol and this did not make any difference. She has tried bearing down, coughing, resting, and medications with no significant effect She tells me she knows her triggers are physical, but feels that there is something else going on She does experience some dizziness when this occurs.  When she was checking her oxygen saturation it was 99 to 100% which was reassuring for her. She tells me she feels completely exhausted after these episodes She tells me that she has not been able to capture these on EKG while at work due to the inconsistent length of time each episode lasts.  PMH, Medications, and Allergies reviewed and updated in chart as appropriate.   ROS negative except for what is listed in HPI. Objective:  BP 120/78   Pulse 97   Ht 5' 2"  (1.575 m)   Wt 211 lb 9.6 oz (96 kg)   SpO2 99%   BMI 38.70 kg/m  Physical Exam Vitals and nursing note reviewed.  Constitutional:      General: She is not in acute distress.    Appearance: Normal appearance. She is not ill-appearing.  HENT:     Head: Normocephalic.  Eyes:     Extraocular Movements: Extraocular movements intact.     Pupils: Pupils are equal, round, and reactive to light.  Neck:     Vascular: No carotid bruit.  Cardiovascular:     Rate and Rhythm: Normal rate and  regular rhythm.     Pulses: Normal pulses.     Heart sounds: Normal heart sounds. No murmur heard.    No friction rub. No gallop.  Pulmonary:     Effort: Pulmonary effort is normal.     Breath sounds: Normal breath sounds. No wheezing.  Chest:     Chest wall: No tenderness.  Abdominal:     General: Bowel sounds are normal. There is no distension.     Palpations: Abdomen is soft.     Tenderness: There is no abdominal tenderness. There is no guarding.  Musculoskeletal:     Cervical back: Normal range of motion. No tenderness.     Right lower leg: No edema.     Left lower leg: No edema.  Lymphadenopathy:     Cervical: No cervical  adenopathy.  Skin:    General: Skin is warm and dry.     Capillary Refill: Capillary refill takes less than 2 seconds.  Neurological:     General: No focal deficit present.     Mental Status: She is alert and oriented to person, place, and time.  Psychiatric:        Mood and Affect: Mood normal.        Behavior: Behavior normal.        Thought Content: Thought content normal.        Judgment: Judgment normal.           Assessment & Plan:   Problem List Items Addressed This Visit     Tachycardia, unspecified - Primary    Recurrent symptoms of tachycardia with seemingly worsening symptomology with each occurrence.  This has been intermittently going on for approximately the past 2 months.  Her symptoms do seem to be exacerbated by physical activity and stress.  In the office today she is not having any symptoms.  Her heart rate and rhythm are regular.  We did discuss the option of EKG however she is currently asymptomatic.  I am concerned of the symptoms that she is experiencing and do feel that an urgent referral to cardiology would be helpful.  She tells me that she is working tomorrow however she is off Monday and Tuesday.  I will reach out to cardiology to see if we could possibly get her in [place] next week for evaluation.  She will likely need cardiac monitoring.  She was provided with 25 mg metoprolol tartrate and instructed to take one half tab if she does begin to experience prolonged symptoms of tachycardia.  We did discuss the importance of emergency evaluation if her symptoms worsen.  She will reach out with any concerns or questions.  Exam today was benign however we will obtain labs for further evaluation.      Relevant Orders   CBC With Diff/Platelet   Iron, TIBC and Ferritin Panel   Thyroid Panel With TSH   Comprehensive metabolic panel   Magnesium   Ambulatory referral to Cardiology   Shortness of breath    Shortness of breath in the setting of intermittent tachycardia  of unknown etiology.  She is asymptomatic at this time and oxygen saturations are 100%.  Heart rate and rhythm are regular in the office today.  Given the intermittent nature and the significance of the symptoms she is experiencing I do feel urgent referral to cardiology is warranted.  We will obtain labs today for further evaluation and send referral.      Relevant Orders   CBC With Diff/Platelet  Iron, TIBC and Ferritin Panel   Thyroid Panel With TSH   Comprehensive metabolic panel   Magnesium   Ambulatory referral to Cardiology   Precordial pain    Chest pain experienced during intermittent episodes of tachycardia.  Unclear at this time if the pain is exacerbated from symptoms of anxiety.  We will send urgent referral for cardiology for evaluation.      Relevant Orders   CBC With Diff/Platelet   Iron, TIBC and Ferritin Panel   Thyroid Panel With TSH   Comprehensive metabolic panel   Magnesium   Ambulatory referral to Cardiology      Orma Render, DNP, AGNP-c 11/14/2021  9:32 PM    History, Medications, Surgery, SDOH, and Family History reviewed and updated as appropriate.

## 2021-11-14 NOTE — Assessment & Plan Note (Signed)
Recurrent symptoms of tachycardia with seemingly worsening symptomology with each occurrence.  This has been intermittently going on for approximately the past 2 months.  Her symptoms do seem to be exacerbated by physical activity and stress.  In the office today she is not having any symptoms.  Her heart rate and rhythm are regular.  We did discuss the option of EKG however she is currently asymptomatic.  I am concerned of the symptoms that she is experiencing and do feel that an urgent referral to cardiology would be helpful.  She tells me that she is working tomorrow however she is off Monday and Tuesday.  I will reach out to cardiology to see if we could possibly get her in [place] next week for evaluation.  She will likely need cardiac monitoring.  She was provided with 25 mg metoprolol tartrate and instructed to take one half tab if she does begin to experience prolonged symptoms of tachycardia.  We did discuss the importance of emergency evaluation if her symptoms worsen.  She will reach out with any concerns or questions.  Exam today was benign however we will obtain labs for further evaluation.

## 2021-11-14 NOTE — Assessment & Plan Note (Signed)
Chest pain experienced during intermittent episodes of tachycardia.  Unclear at this time if the pain is exacerbated from symptoms of anxiety.  We will send urgent referral for cardiology for evaluation.

## 2021-11-14 NOTE — Patient Instructions (Signed)
Call me if you feel like this is getting worse or you are having new symptoms over the weekend.  I will have cardiology call you to set up an appointment.

## 2021-11-15 ENCOUNTER — Other Ambulatory Visit: Payer: Self-pay

## 2021-11-15 LAB — CBC WITH DIFF/PLATELET
Basophils Absolute: 0 10*3/uL (ref 0.0–0.2)
Basos: 0 %
EOS (ABSOLUTE): 0.1 10*3/uL (ref 0.0–0.4)
Eos: 2 %
Hematocrit: 42.3 % (ref 34.0–46.6)
Hemoglobin: 14.2 g/dL (ref 11.1–15.9)
Immature Grans (Abs): 0 10*3/uL (ref 0.0–0.1)
Immature Granulocytes: 0 %
Lymphocytes Absolute: 2.2 10*3/uL (ref 0.7–3.1)
Lymphs: 26 %
MCH: 29.5 pg (ref 26.6–33.0)
MCHC: 33.6 g/dL (ref 31.5–35.7)
MCV: 88 fL (ref 79–97)
Monocytes Absolute: 0.6 10*3/uL (ref 0.1–0.9)
Monocytes: 7 %
Neutrophils Absolute: 5.6 10*3/uL (ref 1.4–7.0)
Neutrophils: 65 %
Platelets: 262 10*3/uL (ref 150–450)
RBC: 4.81 x10E6/uL (ref 3.77–5.28)
RDW: 12.4 % (ref 11.7–15.4)
WBC: 8.6 10*3/uL (ref 3.4–10.8)

## 2021-11-15 LAB — THYROID PANEL WITH TSH
Free Thyroxine Index: 1.6 (ref 1.2–4.9)
T3 Uptake Ratio: 25 % (ref 24–39)
T4, Total: 6.2 ug/dL (ref 4.5–12.0)
TSH: 2.07 u[IU]/mL (ref 0.450–4.500)

## 2021-11-15 LAB — IRON,TIBC AND FERRITIN PANEL
Ferritin: 24 ng/mL (ref 15–150)
Iron Saturation: 22 % (ref 15–55)
Iron: 94 ug/dL (ref 27–159)
Total Iron Binding Capacity: 436 ug/dL (ref 250–450)
UIBC: 342 ug/dL (ref 131–425)

## 2021-11-15 LAB — COMPREHENSIVE METABOLIC PANEL
ALT: 19 IU/L (ref 0–32)
AST: 14 IU/L (ref 0–40)
Albumin/Globulin Ratio: 2.4 — ABNORMAL HIGH (ref 1.2–2.2)
Albumin: 4.7 g/dL (ref 3.9–4.9)
Alkaline Phosphatase: 87 IU/L (ref 44–121)
BUN/Creatinine Ratio: 13 (ref 9–23)
BUN: 9 mg/dL (ref 6–24)
Bilirubin Total: 0.4 mg/dL (ref 0.0–1.2)
CO2: 21 mmol/L (ref 20–29)
Calcium: 9.4 mg/dL (ref 8.7–10.2)
Chloride: 104 mmol/L (ref 96–106)
Creatinine, Ser: 0.67 mg/dL (ref 0.57–1.00)
Globulin, Total: 2 g/dL (ref 1.5–4.5)
Glucose: 113 mg/dL — ABNORMAL HIGH (ref 70–99)
Potassium: 4.5 mmol/L (ref 3.5–5.2)
Sodium: 139 mmol/L (ref 134–144)
Total Protein: 6.7 g/dL (ref 6.0–8.5)
eGFR: 111 mL/min/{1.73_m2} (ref >59)

## 2021-11-15 LAB — MAGNESIUM: Magnesium: 1.9 mg/dL (ref 1.6–2.3)

## 2021-11-18 NOTE — Progress Notes (Unsigned)
Cardiology Office Note:    Date:  11/19/2021   ID:  Paige Hansen, DOB [birth year], MRN [id number]  PCP:  Orma Render, NP  [facility] Cardiologist:  Kate Sable, MD     Referring MD: Orma Render, NP   Chief Complaint:  No chief complaint on file.     History of Present Illness:   Paige Hansen is a 41 y.o. female with history of HTN, obesity, DOE coronary CTA 01/2020 calcium score 0 no evidence of CAD symptoms likely from deconditioning.  Patient was referred back because of recent problems with tachypalpitations.  She has been on metoprolol and has taken extra and also tried coughing and bearing down to help her symptoms.  Symptoms occur with any kind of physical activity such as walking and heart rate will jump up to 150-170 without warning. She says it has been as high as 211 bpm, and can happen 2-6 times a day. 6 months ago it happened after she ate but went away quickly. Not worse with any activity. Moving furniture, walking. On Wed it was off/on 1pm-6pm and she felt exhausted. Started to feel pressure across her arm and back. She drinks 2-12 ounce cans mountain dew daily or coke. Occasional chocolate. In May she was given ipratropium nasal spray.    Past Medical History:  Diagnosis Date   Abdominal pain, epigastric 06/24/2012   Allergy    Anxiety    Colitis    Depression    Post Partum    Diarrhea 06/24/2012   Frequent headaches    GERD (gastroesophageal reflux disease)    Hemorrhoids    Hypertension    Current Medications: Current Meds  Medication Sig   albuterol (VENTOLIN HFA) 108 (90 Base) MCG/ACT inhaler 1 puff as needed Inhalation every 4-6 hrs prn for cough/wheeze 90 days   budesonide-formoterol (SYMBICORT) 80-4.5 MCG/ACT inhaler Inhale 2 puffs into the lungs 2 (two) times daily.   cetirizine (ZYRTEC) 10 MG tablet Take 10 mg by mouth daily.   cyanocobalamin (,VITAMIN B-12,) 1000 MCG/ML injection Inject 1 mL (1,000 mcg total) into  the muscle every 30 (thirty) days. Please include needle and syringe for IM injection.   EPINEPHrine (AUVI-Q) 0.3 mg/0.3 mL IJ SOAJ injection Inject 0.3 mg into the muscle as needed for anaphylaxis. Repeat dose x1 for anaphylaxis symptoms if not improved after 15 minutes with first dose.   EPINEPHrine 0.3 mg/0.3 mL IJ SOAJ injection Inject 0.3 mg into the muscle as needed for anaphylaxis.   famotidine (PEPCID) 40 MG tablet Take 1 tablet (40 mg total) by mouth at bedtime.   fluticasone (FLONASE) 50 MCG/ACT nasal spray Place 2 sprays into both nostrils daily.   gabapentin (NEURONTIN) 100 MG capsule TAKE 1 CAPSULE BY MOUTH 3 (THREE) TIMES DAILY.   ipratropium (ATROVENT) 0.03 % nasal spray Place 2 sprays into both nostrils every 12 (twelve) hours.   metoprolol succinate (TOPROL-XL) 50 MG 24 hr tablet Take 1 tablet (50 mg total) by mouth 2 (two) times daily. Take with or immediately following a meal.   pantoprazole (PROTONIX) 40 MG tablet Take 1 tablet (40 mg total) by mouth daily.   [DISCONTINUED] metoprolol succinate (TOPROL-XL) 25 MG 24 hr tablet Take 2 tablets (50 mg total) by mouth daily.    Allergies:   Latex   Social History   Tobacco Use   Smoking status: Never   Smokeless tobacco: Never  Vaping Use   Vaping Use: Never used  Substance Use Topics  Alcohol use: No   Drug use: No    Family Hx: The patient's family history includes Alcohol abuse in her brother and father; Arthritis in her maternal grandfather and maternal grandmother; Asthma in her brother and daughter; Breast cancer in her maternal grandmother; Cancer - Other in her maternal grandfather; Diabetes in her maternal grandfather, mother, paternal grandfather, and paternal grandmother; Hearing loss in her maternal grandfather; Heart attack in her maternal grandfather; Heart disease in her maternal grandfather and paternal grandmother; Hyperlipidemia in her maternal grandfather, maternal grandmother, and mother; Hypertension in  her maternal grandfather, maternal grandmother, mother, and paternal grandmother; Hypothyroidism in her mother; Intellectual disability in her daughter; Kidney disease in her maternal grandfather and maternal grandmother; Stroke in her father. There is no history of Colon cancer, Esophageal cancer, Rectal cancer, Stomach cancer, Colon polyps, Headache, or Migraines.  ROS   EKGs/Labs/Other Test Reviewed:    EKG:  EKG is   ordered today.  The ekg ordered today demonstrates NSR 99/m  Recent Labs: 11/14/2021: ALT 19; BUN 9; Creatinine, Ser 0.67; Hemoglobin 14.2; Magnesium 1.9; Platelets 262; Potassium 4.5; Sodium 139; TSH 2.070   Recent Lipid Panel No results for input(s): "CHOL", "TRIG", "HDL", "VLDL", "LDLCALC", "LDLDIRECT" in the last 8760 hours.   Prior CV Studies: ECHO COMPLETE WO IMAGING ENHANCING AGENT 01/13/2020  Narrative ECHOCARDIOGRAM REPORT    Patient Name:   Paige Hansen Date of Exam: 01/13/2020 Medical Rec #:  [id number]        Height:       62.0 in Accession #:    [id number]       Weight:       202.0 lb Date of Birth:  April 16, 1980        BSA:          1.920 m Patient Age:    11 years         BP:           124/88 mmHg Patient Gender: F                HR:           86 bpm. Exam Location:  [place]  Procedure: 2D Echo, Cardiac Doppler and Color Doppler  Indications:    R06.02 SOB; R07.9* Chest pain, unspecified  History:        Patient has no prior history of Echocardiogram examinations. Arrythmias:Tachycardia, Signs/Symptoms:Chest Pain and Shortness of Breath; Risk Factors:Hypertension and Non-Smoker. GERD.  Sonographer:    Pilar Jarvis RDMS, RVT, RDCS Referring Phys: [id number] BRIAN AGBOR-ETANG  IMPRESSIONS   1. Left ventricular ejection fraction, by estimation, is 60 to 65%. The left ventricle has normal function. The left ventricle has no regional wall motion abnormalities. Left ventricular diastolic parameters were normal. 2. Right ventricular systolic function  is normal. The right ventricular size is normal. 3. The mitral valve is normal in structure. Trivial mitral valve regurgitation.  FINDINGS Left Ventricle: Left ventricular ejection fraction, by estimation, is 60 to 65%. The left ventricle has normal function. The left ventricle has no regional wall motion abnormalities. Global longitudinal strain performed but not reported based on interpreter judgement due to suboptimal tracking. The left ventricular internal cavity size was normal in size. There is no left ventricular hypertrophy. Left ventricular diastolic parameters were normal.  Right Ventricle: The right ventricular size is normal. No increase in right ventricular wall thickness. Right ventricular systolic function is normal.  Left Atrium: Left atrial size was normal in size.  Right Atrium:  Right atrial size was normal in size.  Pericardium: There is no evidence of pericardial effusion.  Mitral Valve: The mitral valve is normal in structure. Trivial mitral valve regurgitation. No evidence of mitral valve stenosis.  Tricuspid Valve: The tricuspid valve is normal in structure. Tricuspid valve regurgitation is not demonstrated. No evidence of tricuspid stenosis.  Aortic Valve: The aortic valve was not well visualized. Aortic valve regurgitation is not visualized. No aortic stenosis is present. Aortic valve mean gradient measures 3.0 mmHg. Aortic valve peak gradient measures 5.0 mmHg. Aortic valve area, by VTI measures 3.08 cm.  Pulmonic Valve: The pulmonic valve was normal in structure. Pulmonic valve regurgitation is not visualized. No evidence of pulmonic stenosis.  Aorta: The aortic root is normal in size and structure.  Venous: The inferior vena cava is normal in size with greater than 50% respiratory variability, suggesting right atrial pressure of 3 mmHg.  IAS/Shunts: No atrial level shunt detected by color flow Doppler.   LEFT VENTRICLE PLAX 2D LVIDd:         4.20 cm      Diastology LVIDs:         2.70 cm     LV e' medial:    6.96 cm/s LV PW:         0.80 cm     LV E/e' medial:  9.9 LV IVS:        1.00 cm     LV e' lateral:   11.10 cm/s LVOT diam:     2.10 cm     LV E/e' lateral: 6.2 LV SV:         67 LV SV Index:   35          2D Longitudinal Strain LVOT Area:     3.46 cm    2D Strain GLS (A2C):   -10.8 % 2D Strain GLS (A3C):   -9.0 % 2D Strain GLS (A4C):   -10.7 % LV Volumes (MOD)           2D Strain GLS Avg:     -10.2 % LV vol d, MOD A2C: 83.2 ml LV vol d, MOD A4C: 83.6 ml LV vol s, MOD A2C: 35.6 ml LV vol s, MOD A4C: 36.1 ml LV SV MOD A2C:     47.6 ml LV SV MOD A4C:     83.6 ml LV SV MOD BP:      49.5 ml  RIGHT VENTRICLE RV Basal diam:  3.60 cm RV S prime:     14.30 cm/s TAPSE (M-mode): 2.2 cm  LEFT ATRIUM             Index       RIGHT ATRIUM           Index LA diam:        3.00 cm 1.56 cm/m  RA Area:     13.70 cm LA Vol (A2C):   49.0 ml 25.52 ml/m RA Volume:   34.20 ml  17.81 ml/m LA Vol (A4C):   42.9 ml 22.35 ml/m LA Biplane Vol: 46.4 ml 24.17 ml/m AORTIC VALVE                   PULMONIC VALVE AV Area (Vmax):    3.40 cm    PV Vmax:       0.80 m/s AV Area (Vmean):   3.04 cm    PV Peak grad:  2.6 mmHg AV Area (VTI):     3.08 cm AV Vmax:  112.00 cm/s AV Vmean:          83.000 cm/s AV VTI:            0.218 m AV Peak Grad:      5.0 mmHg AV Mean Grad:      3.0 mmHg LVOT Vmax:         110.00 cm/s LVOT Vmean:        72.800 cm/s LVOT VTI:          0.194 m LVOT/AV VTI ratio: 0.89  AORTA Ao Root diam: 2.70 cm Ao Asc diam:  2.70 cm Ao Arch diam: 2.5 cm  MITRAL VALVE MV Area (PHT): 3.39 cm    SHUNTS MV Decel Time: 224 msec    Systemic VTI:  0.19 m MV E velocity: 69.00 cm/s  Systemic Diam: 2.10 cm MV A velocity: 56.60 cm/s MV E/A ratio:  1.22  Ida Rogue MD Electronically signed by Ida Rogue MD Signature Date/Time: 01/16/2020/7:50:32 AM    Final   No results found for this or any previous visit from  the past 3650 days.   CT CORONARY MORPH W/CTA COR W/SCORE W/CA W/CM &/OR WO/CM 01/26/2020  Addendum 01/26/2020  2:55 PM ADDENDUM REPORT: 01/26/2020 14:53  EXAM: OVER-READ INTERPRETATION  CT CHEST  The following report is an over-read performed by radiologist Dr. Norlene Duel [facility] on 01/26/2020. This over-read does not include interpretation of cardiac or coronary anatomy or pathology. The coronary CTA and coronary calcium score interpretation by the cardiologist is attached.  COMPARISON:  None.  FINDINGS: There is no mass or adenopathy identified.  Visualized portions of the lower airway and esophagus are unremarkable.  No pleural effusion, airspace consolidation, or atelectasis. No suspicious nodule or mass.  Limited images through the upper abdomen are unremarkable.  The visualized osseous structures are unremarkable.  IMPRESSION: Negative over-read.   Electronically Signed By: Kerby Moors M.D. On: 01/26/2020 14:53  Narrative CLINICAL DATA:  chestpain  EXAM: Cardiac/Coronary  CTA  TECHNIQUE: The patient was scanned on a Siemens Somatoform go.Top scanner.  A retrospective scan was triggered in the descending thoracic aorta. Axial non-contrast 3 mm slices were carried out through the heart. The data set was analyzed on a dedicated work station and scored using the [place]. Gantry rotation speed was 330 msecs and collimation was .6 mm. 116m of metoprolol, 115mivabradine and 0.8 mg of sl NTG was given. The 3D data set was reconstructed in 5% intervals of the 60-95 % of the R-R cycle. Diastolic phases were analyzed on a dedicated work station using MPR, MIP and VRT modes. The patient received 90 cc of contrast.  FINDINGS: Aorta:  Normal size.  No calcifications.  No dissection.  Aortic Valve:  Trileaflet.  No calcifications.  Coronary Arteries:  Normal coronary origin.  Left dominance.  RCA is a small non-dominant  artery.  There is no plaque.  Left main is a large artery that gives rise to LAD and LCX arteries.  LAD is a medium size vessel that has no plaque.  LCX is a dominant artery that gives rise to one OM1 branch and supplies the PDA. There is no plaque.  Other findings:  Normal pulmonary vein drainage into the left atrium.  Normal left atrial appendage without a thrombus.  Normal size of the pulmonary artery.  IMPRESSION: 1. Coronary calcium score of 0. Patient is low risk for coronary events  2. Normal coronary origin with left dominance.  3. No evidence of  CAD.  4. CAD-RADS 0.  Consider non-atherosclerotic causes of chest pain.  Electronically Signed: By: Kate Sable M.D. On: 01/26/2020 13:48         Risk Assessment/Calculations/Metrics:              Physical Exam:    VS:  BP 138/88   Pulse (!) 105   Ht 5' 2"  (1.575 m)   Wt 212 lb (96.2 kg)   SpO2 98%   BMI 38.78 kg/m     Wt Readings from Last 3 Encounters:  11/19/21 212 lb (96.2 kg)  11/14/21 211 lb 9.6 oz (96 kg)  06/03/21 193 lb (87.5 kg)    Physical Exam  GEN: Obese, in no acute distress   Neck: no JVD, carotid bruits, or masses Cardiac:RRR; no murmurs, rubs, or gallops  Respiratory:  clear to auscultation bilaterally, normal work of breathing GI: soft, nontender, nondistended, + BS Ext: without cyanosis, clubbing, or edema, Good distal pulses bilaterally Neuro:  Alert and Oriented x 3,  Psych: euthymic mood, full affect       ASSESSMENT & PLAN:   No problem-specific Assessment & Plan notes found for this encounter.        Tachycardia/palpitations on metoprolol 50 mg daily but symptoms worse recently with any activity. Increase toprol xl 50 mg bid, 2 week zio. Recent labs reviewed and normal including thyroid panel. Decrease caffeine intake.  DOE echo normal and coronary CTA 01/2020 calcium score 0 and no evidence CAD, likely symptoms from deconditioning  HTN BP  stable  Obesity-was on phentermine but stopped in Jan.      Dispo:  No follow-ups on file.   Medication Adjustments/Labs and Tests Ordered: Current medicines are reviewed at length with the patient today.  Concerns regarding medicines are outlined above.  Tests Ordered: Orders Placed This Encounter  Procedures   LONG TERM MONITOR (3-14 DAYS)   EKG 12-Lead   Medication Changes: Meds ordered this encounter  Medications   metoprolol succinate (TOPROL-XL) 50 MG 24 hr tablet    Sig: Take 1 tablet (50 mg total) by mouth 2 (two) times daily. Take with or immediately following a meal.    Dispense:  180 tablet    Refill:  3   Signed, Ermalinda Barrios, PA-C  11/19/2021 1:55 PM    [address], [place], [place]  [address] Phone: [phone]; Fax: [phone]

## 2021-11-19 ENCOUNTER — Ambulatory Visit: Payer: 59 | Attending: Physician Assistant | Admitting: Physician Assistant

## 2021-11-19 ENCOUNTER — Encounter: Payer: Self-pay | Admitting: Physician Assistant

## 2021-11-19 ENCOUNTER — Ambulatory Visit (INDEPENDENT_AMBULATORY_CARE_PROVIDER_SITE_OTHER): Payer: 59

## 2021-11-19 ENCOUNTER — Other Ambulatory Visit: Payer: Self-pay | Admitting: Physician Assistant

## 2021-11-19 ENCOUNTER — Other Ambulatory Visit: Payer: Self-pay

## 2021-11-19 VITALS — BP 138/88 | HR 105 | Ht 62.0 in | Wt 212.0 lb

## 2021-11-19 DIAGNOSIS — R0609 Other forms of dyspnea: Secondary | ICD-10-CM

## 2021-11-19 DIAGNOSIS — R000 Tachycardia, unspecified: Secondary | ICD-10-CM

## 2021-11-19 DIAGNOSIS — Z6837 Body mass index (BMI) 37.0-37.9, adult: Secondary | ICD-10-CM | POA: Diagnosis not present

## 2021-11-19 DIAGNOSIS — I10 Essential (primary) hypertension: Secondary | ICD-10-CM | POA: Diagnosis not present

## 2021-11-19 DIAGNOSIS — R002 Palpitations: Secondary | ICD-10-CM | POA: Diagnosis not present

## 2021-11-19 MED ORDER — PANTOPRAZOLE SODIUM 40 MG PO TBEC
40.0000 mg | DELAYED_RELEASE_TABLET | Freq: Every day | ORAL | 1 refills | Status: DC
  Filled 2021-11-19 – 2022-01-09 (×2): qty 60, 60d supply, fill #0

## 2021-11-19 MED ORDER — METOPROLOL SUCCINATE ER 50 MG PO TB24
50.0000 mg | ORAL_TABLET | Freq: Two times a day (BID) | ORAL | 3 refills | Status: DC
  Filled 2021-11-19 – 2022-01-09 (×2): qty 180, 90d supply, fill #0
  Filled 2022-03-04: qty 180, 90d supply, fill #1

## 2021-11-19 NOTE — Progress Notes (Unsigned)
[id number] ZIO XT from office inventory applied to patient.  Dr.Agbor-Etang to read.

## 2021-11-19 NOTE — Patient Instructions (Signed)
Medication Instructions:  1.Increase metoprolol succinate (Toprol XL) to 50 mg twice daily *If you need a refill on your cardiac medications before your next appointment, please call your pharmacy*   Lab Work: None If you have labs (blood work) drawn today and your tests are completely normal, you will receive your results only by: [facility] (if you have MyChart) OR A paper copy in the mail If you have any lab test that is abnormal or we need to change your treatment, we will call you to review the results.   Testing/Procedures: [place] [place]- Long Term Monitor Instructions  Your physician has requested you wear a ZIO patch monitor for 14 days.  This is a single patch monitor. [employer] supplies one patch monitor per enrollment. Additional stickers are not available. Please do not apply patch if you will be having a Nuclear Stress Test,  Echocardiogram, Cardiac CT, MRI, or Chest Xray during the period you would be wearing the  monitor. The patch cannot be worn during these tests. You cannot remove and re-apply the  ZIO XT patch monitor.  Your ZIO patch monitor will be mailed 3 day USPS to your address on file. It may take 3-5 days  to receive your monitor after you have been enrolled.  Once you have received your monitor, please review the enclosed instructions. Your monitor  has already been registered assigning a specific monitor serial # to you.  Billing and Patient Assistance Program Information  We have supplied [employer] with any of your insurance information on file for billing purposes. [employer] offers a sliding scale Patient Assistance Program for patients that do not have  insurance, or whose insurance does not completely cover the cost of the ZIO monitor.  You must apply for the Patient Assistance Program to qualify for this discounted rate.  To apply, please call [employer] at [phone], select option 4, select option 2, ask to apply for  Patient Assistance Program. Paige Hansen  will ask your household income, and how many people  are in your household. They will quote your out-of-pocket cost based on that information.  [employer] will also be able to set up a 37-month interest-free payment plan if needed.  Applying the monitor   Shave hair from upper left chest.  Hold abrader disc by orange tab. Rub abrader in 40 strokes over the upper left chest as  indicated in your monitor instructions.  Clean area with 4 enclosed alcohol pads. Let dry.  Apply patch as indicated in monitor instructions. Patch will be placed under collarbone on left  side of chest with arrow pointing upward.  Rub patch adhesive wings for 2 minutes. Remove white label marked "1". Remove the white  label marked "2". Rub patch adhesive wings for 2 additional minutes.  While looking in a mirror, press and release button in center of patch. A small green light will  flash 3-4 times. This will be your only indicator that the monitor has been turned on.  Do not shower for the first 24 hours. You may shower after the first 24 hours.  Press the button if you feel a symptom. You will hear a small click. Record Date, Time and  Symptom in the Patient Logbook.  When you are ready to remove the patch, follow instructions on the last 2 pages of Patient  Logbook. Stick patch monitor onto the last page of Patient Logbook.  Place Patient Logbook in the blue and white box. Use locking tab on box and tape box closed  securely. The blue and white box has prepaid postage on it. Please place it in the mailbox as  soon as possible. Your physician should have your test results approximately 7 days after the  monitor has been mailed back to [facility].  Call [facility] at [phone] if you have questions regarding  your ZIO XT patch monitor. Call them immediately if you see an orange light blinking on your  monitor.  If your monitor falls off in less than 4 days, contact our Monitor department at  [phone].  If your monitor becomes loose or falls off after 4 days call [employer] at [phone] for  suggestions on securing your monitor    Follow-Up: At [facility], you and your health needs are our priority.  As part of our continuing mission to provide you with exceptional heart care, we have created designated Provider Care Teams.  These Care Teams include your primary Cardiologist (physician) and Advanced Practice Providers (APPs -  Physician Assistants and Nurse Practitioners) who all work together to provide you with the care you need, when you need it.   Your next appointment:   6 week(s)  The format for your next appointment:   In Person  Provider:   Ermalinda Barrios, PA-C        Other Instructions Selinda Eon recommends that you decrease your caffeine intake  Important Information About Sugar

## 2021-12-06 ENCOUNTER — Other Ambulatory Visit: Payer: Self-pay

## 2021-12-09 DIAGNOSIS — R000 Tachycardia, unspecified: Secondary | ICD-10-CM | POA: Diagnosis not present

## 2021-12-12 ENCOUNTER — Other Ambulatory Visit: Payer: Self-pay

## 2021-12-18 DIAGNOSIS — Z01419 Encounter for gynecological examination (general) (routine) without abnormal findings: Secondary | ICD-10-CM | POA: Diagnosis not present

## 2021-12-18 DIAGNOSIS — Z1231 Encounter for screening mammogram for malignant neoplasm of breast: Secondary | ICD-10-CM | POA: Diagnosis not present

## 2021-12-18 DIAGNOSIS — Z6837 Body mass index (BMI) 37.0-37.9, adult: Secondary | ICD-10-CM | POA: Diagnosis not present

## 2022-01-07 NOTE — Progress Notes (Unsigned)
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  I saw the patient 11/19/21 with tachy palpitations with physical activity and taking extra metoprolol and tried vagal maneuvers. I increased her toprol 50 mg bid and ordered 2 week zio, decrease caffeine intake. Zio showed 127 SVT runs see below for details. I offered to increase her metoprolol 75 mg bid but she was feeling better on 50 mg in pm 25 mg in am-higher dose made her fatigued.  Patient comes in for f/u. She gets fatigue on higher doses. She was doing better on metoprolol 50 mg pm. Last night her HR was 174 and lasted 3-5 min and says it was painful-not pressure. caffeine-gets about 20 ounces a day between coffee/soda/sweet tea.       Past Medical History:  Diagnosis Date   Abdominal pain, epigastric 06/24/2012   Allergy    Anxiety    Colitis    Depression    Post Partum    Diarrhea 06/24/2012   Frequent headaches    GERD (gastroesophageal reflux disease)    Hemorrhoids    Hypertension    Current Medications: Current Meds  Medication Sig   albuterol (VENTOLIN HFA) 108 (90 Base) MCG/ACT inhaler 1 puff as needed Inhalation every 4-6 hrs prn for cough/wheeze 90 days   cetirizine (ZYRTEC) 10 MG tablet Take 10 mg by mouth daily.   EPINEPHrine 0.3 mg/0.3 mL IJ SOAJ injection Inject 0.3 mg into the muscle as needed for anaphylaxis.   famotidine (PEPCID) 40 MG tablet Take 1 tablet (40 mg total) by mouth at bedtime.   fluticasone (FLONASE) 50 MCG/ACT nasal spray Place 2 sprays into both  nostrils daily.   gabapentin (NEURONTIN) 100 MG capsule TAKE 1 CAPSULE BY MOUTH 3 (THREE) TIMES DAILY.   metoprolol succinate (TOPROL-XL) 50 MG 24 hr tablet Take 1 tablet (50 mg total) by mouth 2 (two) times daily. Take with or immediately following a meal.   pantoprazole (PROTONIX) 40 MG tablet Take 1 tablet (40 mg total) by mouth daily.    Allergies:   Latex   Social History   Tobacco Use   Smoking status: Never   Smokeless tobacco: Never  Vaping Use   Vaping Use: Never used  Substance Use Topics   Alcohol use: No   Drug use: No    Family Hx: The patient's family history includes Alcohol abuse in her brother and father; Arthritis in her maternal grandfather and maternal grandmother; Asthma in her brother and daughter; Breast cancer in her maternal grandmother; Cancer - Other in her maternal grandfather; Diabetes in her maternal grandfather, mother, paternal grandfather, and paternal grandmother; Hearing loss in her maternal grandfather; Heart attack in her maternal grandfather; Heart disease in her maternal grandfather and paternal grandmother; Hyperlipidemia in her maternal grandfather, maternal grandmother, and mother; Hypertension in her maternal grandfather, maternal grandmother, mother, and paternal grandmother; Hypothyroidism in her mother; Intellectual disability in her daughter; Kidney disease in her maternal grandfather and maternal grandmother; Stroke in her  father. There is no history of Colon cancer, Esophageal cancer, Rectal cancer, Stomach cancer, Colon polyps, Headache, or Migraines.  ROS     Physical Exam:    VS:  BP 116/78   Pulse (!) 102   Ht 5' 2"  (1.575 m)   Wt 215 lb 8 oz (97.8 kg)   SpO2 99%   BMI 39.42 kg/m     Wt Readings from Last 3 Encounters:  01/08/22 215 lb 8 oz (97.8 kg)  11/19/21 212 lb (96.2 kg)  11/14/21 211 lb 9.6 oz (96 kg)    Physical Exam  GEN: Obese, in no acute distress  Neck: no JVD, carotid bruits, or masses Cardiac:RRR; no  murmurs, rubs, or gallops  Respiratory:  clear to auscultation bilaterally, normal work of breathing GI: soft, nontender, nondistended, + BS Ext: without cyanosis, clubbing, or edema, Good distal pulses bilaterally Neuro:  Alert and Oriented x 3,  Psych: euthymic mood, full affect        EKGs/Labs/Other Test Reviewed:    EKG:  EKG is not ordered today.     Recent Labs: 11/14/2021: ALT 19; BUN 9; Creatinine, Ser 0.67; Hemoglobin 14.2; Magnesium 1.9; Platelets 262; Potassium 4.5; Sodium 139; TSH 2.070   Recent Lipid Panel No results for input(s): "CHOL", "TRIG", "HDL", "VLDL", "LDLCALC", "LDLDIRECT" in the last 8760 hours.   Prior CV Studies:   Zio 12/12/21 Patch Wear Time:  7 days and 17 hours (2023-09-12T13:09:14-0400 to 2023-09-20T06:09:27-0400)   Patient had a min HR of 59 bpm, max HR of 218 bpm, and avg HR of 96 bpm. Predominant underlying rhythm was Sinus Rhythm. 127 Supraventricular Tachycardia runs occurred, the run with the fastest interval lasting 15.4 secs with a max rate of 218 bpm, the  longest lasting 36.7 secs with an avg rate of 195 bpm. Supraventricular Tachycardia was detected within +/- 45 seconds of symptomatic patient event(s). Isolated SVEs were rare (<1.0%), SVE Couplets were rare (<1.0%), and SVE Triplets were rare (<1.0%).  Isolated VEs were rare (<1.0%), and no VE Couplets or VE Triplets were present.    Conclusion SVT lasting up to 36 seconds noted.   Risk Assessment/Calculations/Metrics:              ASSESSMENT & PLAN:   No problem-specific Assessment & Plan notes found for this encounter.   SVT-127 episodes on recent Zio that she only wore for 5 days(fell off) and didn't have a lot of symptoms. She can't tolerate higher doses of BB because of fatigue. HR up 174/m last night. Will try low dose diltiazem 120 mg daily in addition to metoprolol. Refer to EPS for further treatment. Decrease caffeine and sugar intake.   DOE echo normal and coronary CTA  01/2020 calcium score 0 and no evidence CAD, likely symptoms from deconditioning   HTN BP stable   Obesity-was on phentermine but stopped in Jan.               Dispo:  No follow-ups on file.   Medication Adjustments/Labs and Tests Ordered: Current medicines are reviewed at length with the patient today.  Concerns regarding medicines are outlined above.  Tests Ordered: No orders of the defined types were placed in this encounter.  Medication Changes: No orders of the defined types were placed in this encounter.  Signed, Ermalinda Barrios, PA-C  01/08/2022 12:59 PM    [address], [place], [place]  [address] Phone: [phone]; Fax: [phone]

## 2022-01-08 ENCOUNTER — Encounter: Payer: Self-pay | Admitting: Physician Assistant

## 2022-01-08 ENCOUNTER — Other Ambulatory Visit: Payer: Self-pay

## 2022-01-08 ENCOUNTER — Ambulatory Visit: Payer: 59 | Attending: Physician Assistant | Admitting: Physician Assistant

## 2022-01-08 VITALS — BP 116/78 | HR 102 | Ht 62.0 in | Wt 215.5 lb

## 2022-01-08 DIAGNOSIS — I10 Essential (primary) hypertension: Secondary | ICD-10-CM | POA: Diagnosis not present

## 2022-01-08 DIAGNOSIS — I471 Supraventricular tachycardia, unspecified: Secondary | ICD-10-CM

## 2022-01-08 DIAGNOSIS — Z6837 Body mass index (BMI) 37.0-37.9, adult: Secondary | ICD-10-CM

## 2022-01-08 MED ORDER — DILTIAZEM HCL ER COATED BEADS 120 MG PO CP24
120.0000 mg | ORAL_CAPSULE | Freq: Every day | ORAL | 3 refills | Status: DC
  Filled 2022-01-08: qty 90, 90d supply, fill #0

## 2022-01-08 NOTE — Patient Instructions (Signed)
Medication Instructions:  Your physician has recommended you make the following change in your medication:   START: Diltiazem 14m daily  *If you need a refill on your cardiac medications before your next appointment, please call your pharmacy*   Lab Work: None If you have labs (blood work) drawn today and your tests are completely normal, you will receive your results only by: [facility](if you have MyChart) OR A paper copy in the mail If you have any lab test that is abnormal or we need to change your treatment, we will call you to review the results.   Follow-Up: [facility] and your health needs are our priority.  As part of our continuing mission to provide you with exceptional heart care, we have created designated Provider Care Teams.  These Care Teams include your primary Cardiologist (physician) and Advanced Practice Providers (APPs -  Physician Assistants and Nurse Practitioners) who all work together to provide you with the care you need, when you need it.   Your next appointment:   6 month(s)  The format for your next appointment:   In Person  Provider:   BKate Sable MD     Important Information About Sugar

## 2022-01-09 ENCOUNTER — Other Ambulatory Visit: Payer: Self-pay

## 2022-02-06 ENCOUNTER — Encounter: Payer: Self-pay | Admitting: Cardiovascular Disease

## 2022-02-06 ENCOUNTER — Other Ambulatory Visit: Payer: Self-pay

## 2022-02-06 ENCOUNTER — Ambulatory Visit: Payer: 59 | Attending: Cardiovascular Disease | Admitting: Cardiovascular Disease

## 2022-02-06 VITALS — BP 134/78 | HR 105 | Ht 62.0 in | Wt 216.8 lb

## 2022-02-06 DIAGNOSIS — I471 Supraventricular tachycardia, unspecified: Secondary | ICD-10-CM | POA: Diagnosis not present

## 2022-02-06 MED ORDER — PROPRANOLOL HCL ER 120 MG PO CP24
120.0000 mg | ORAL_CAPSULE | Freq: Every day | ORAL | 0 refills | Status: DC
  Filled 2022-02-06 – 2022-02-11 (×3): qty 10, 10d supply, fill #0

## 2022-02-06 MED ORDER — ATENOLOL 25 MG PO TABS
25.0000 mg | ORAL_TABLET | Freq: Every day | ORAL | 0 refills | Status: DC
  Filled 2022-02-06: qty 10, 10d supply, fill #0

## 2022-02-06 NOTE — Patient Instructions (Addendum)
Medication Instructions:  Your physician has recommended you make the following change in your medication:  1) Take propanolol 120 mg daily for 5 days  2) Then take atenolol 25 mg daily for 5 days *Do not take your metoprolol while you are trailing these medications  *If you need a refill on your cardiac medications before your next appointment, please call your pharmacy*  Lab Work: BMET and CBC If you have labs (blood work) drawn today and your tests are completely normal, you will receive your results only by: [facility] (if you have MyChart) OR A paper copy in the mail If you have any lab test that is abnormal or we need to change your treatment, we will call you to review the results.  Testing/Procedures: Your physician has recommended that you have an EP Study. This test is used to assess serious arrhythmias (irregular heartbeats). During an Electro-physiology Study (EPS), a thin, flexible wire is passed through a vein in your groin (upper thigh) or neck up to the heart. The wire records the heart's electrical signals. Your doctor uses the wire to electrically stimulate your heart and trigger an arrhythmic. This allows the doctor to see whether an antiarrhythmia medicine can help manage the problem or if further procedures are necessary (i.e., ablation/ICD). Radiofrequency ablation, a procedure used to fix some types of arrthythmia, may be done during an EPS. This is done in the hospital and often requires an overnight stay. Please see the instruction sheet given to your today for more information.  Your physician has recommended that you have an ablation. Catheter ablation is a medical procedure used to treat some cardiac arrhythmias (irregular heartbeats). During catheter ablation, a long, thin, flexible tube is put into a blood vessel in your groin (upper thigh), or neck. This tube is called an ablation catheter. It is then guided to your heart through the blood vessel. Radio frequency  waves destroy small areas of heart tissue where abnormal heartbeats may cause an arrhythmia to start. Please see the instruction sheet given to you today.   Follow-Up: At [facility], you and your health needs are our priority.  As part of our continuing mission to provide you with exceptional heart care, we have created designated Provider Care Teams.  These Care Teams include your primary Cardiologist (physician) and Advanced Practice Providers (APPs -  Physician Assistants and Nurse Practitioners) who all work together to provide you with the care you need, when you need it.  Your next appointment:   See instruction letter  Important Information About Sugar

## 2022-02-06 NOTE — Progress Notes (Signed)
Electrophysiology Office Note:    Date:  02/06/2022   ID:  Paige Hansen, DOB [birth year], MRN [id number]  PCP:  Orma Render, NP   [facility] Cardiologist:  Kate Sable, MD Electrophysiologist:  Melida Quitter, MD     Referring MD: Imogene Burn, PA-C   History of Present Illness:    Paige Hansen is a 41 y.o. female with a hx listed below, significant for SVT, referred for arrhythmia management.  The patient saw her primary provider in September 2023 with complaints of rapid palpitations.  These occur primarily with physical activity.  Metoprolol was increased to 50 mg twice daily.  A 2-week Zio patch was ordered that showed runs of SVT at about 200 bpm lasting up to 36.7 seconds.  Symptom events were associated with SVT episodes.  Her longest episode of tachypalpitations has lasted hours. She is very symptomatic. Rates are sometimes > 200 bpm. She feels a throbbing in her throat. Vagal maneuvers and coughing have sometimes aborted pre-syncopal symptoms but do not convert her to sinus. Activity - exercise and strenuous work tend to exacerbate her symptoms. She was largely unaware of events recorded on her monitor.  Past Medical History:  Diagnosis Date   Abdominal pain, epigastric 06/24/2012   Allergy    Anxiety    Colitis    Depression    Post Partum    Diarrhea 06/24/2012   Frequent headaches    GERD (gastroesophageal reflux disease)    Hemorrhoids    Hypertension     Past Surgical History:  Procedure Laterality Date   COLONOSCOPY     ESSURE TUBAL LIGATION  04/2009   WISDOM TOOTH EXTRACTION     1998    Current Medications: No outpatient medications have been marked as taking for the 02/06/22 encounter (Appointment) with Aleria Maheu, Yetta Barre, MD.     Allergies:   Latex   Social History   Socioeconomic History   Marital status: Married    Spouse name: Not on file   Number of children: 2   Years of education: Not on file    Highest education level: Not on file  Occupational History   Occupation: [employer]    Employer: [employer]  Tobacco Use   Smoking status: Never   Smokeless tobacco: Never  Vaping Use   Vaping Use: Never used  Substance and Sexual Activity   Alcohol use: No   Drug use: No   Sexual activity: Yes    Birth control/protection: Other-see comments    Comment: essure implant   Other Topics Concern   Not on file  Social History Narrative   Lives at home with husband and children   Right handed   Caffeine: 2 cups/day   Social Determinants of Health   Financial Resource Strain: Not on file  Food Insecurity: Not on file  Transportation Needs: Not on file  Physical Activity: Not on file  Stress: Not on file  Social Connections: Not on file     Family History: The patient's family history includes Alcohol abuse in her brother and father; Arthritis in her maternal grandfather and maternal grandmother; Asthma in her brother and daughter; Breast cancer in her maternal grandmother; Cancer - Other in her maternal grandfather; Diabetes in her maternal grandfather, mother, paternal grandfather, and paternal grandmother; Hearing loss in her maternal grandfather; Heart attack in her maternal grandfather; Heart disease in her maternal grandfather and paternal grandmother; Hyperlipidemia in her maternal grandfather, maternal grandmother, and mother;  Hypertension in her maternal grandfather, maternal grandmother, mother, and paternal grandmother; Hypothyroidism in her mother; Intellectual disability in her daughter; Kidney disease in her maternal grandfather and maternal grandmother; Stroke in her father. There is no history of Colon cancer, Esophageal cancer, Rectal cancer, Stomach cancer, Colon polyps, Headache, or Migraines.  ROS:   Please see the history of present illness.    All other systems reviewed and are negative.  EKGs/Labs/Other Studies Reviewed Today:     Wearable monitor reviewed  EKG:   Last EKG results: today. Sinus tachycardia   Recent Labs: 11/14/2021: ALT 19; BUN 9; Creatinine, Ser 0.67; Hemoglobin 14.2; Magnesium 1.9; Platelets 262; Potassium 4.5; Sodium 139; TSH 2.070     Physical Exam:    VS:  There were no vitals taken for this visit.    Wt Readings from Last 3 Encounters:  01/08/22 215 lb 8 oz (97.8 kg)  11/19/21 212 lb (96.2 kg)  11/14/21 211 lb 9.6 oz (96 kg)     GEN:  Well nourished, well developed in no acute distress CARDIAC: RRR, no murmurs, rubs, gallops RESPIRATORY:  Normal work of breathing MUSCULOSKELETAL: no edema    ASSESSMENT & PLAN:    SVT: appears to be long R-P. Initiated by activity/sympathetic nervous system stimuli. Not significantly responsive to vagal maneuvers. Has occurred in short burst on her monitor. Seems to me most consistent with AT, possibly atypical AVNRT. I think EP study is reasonable. Prior to this, will try other betablockers that she can try serially. She will stop metoprolol and take propranolol for 5 days, then stop this and try atenolol for a few days. We discussed the indication, rationale, logistics, anticipated benefits, and potential risks of the ablation procedure including but not limited to -- bleed at the groin access site, chest pain, damage to nearby organs such as the diaphragm, lungs, or esophagus, need for a drainage tube, or prolonged hospitalization. I explained that the risk for stroke, heart attack, need for open chest surgery, or even death is very low but not zero. she  expressed understanding and wishes to proceed.  Sinus tachycardia: continue betablocker. No intervention indicated        Medication Adjustments/Labs and Tests Ordered: Current medicines are reviewed at length with the patient today.  Concerns regarding medicines are outlined above.  No orders of the defined types were placed in this encounter.  No orders of the defined types were placed in this encounter.    Signed, Melida Quitter, MD  02/06/2022 2:15 PM    [place]

## 2022-02-07 ENCOUNTER — Other Ambulatory Visit: Payer: Self-pay

## 2022-02-11 ENCOUNTER — Other Ambulatory Visit: Payer: Self-pay

## 2022-02-11 ENCOUNTER — Telehealth: Payer: Self-pay

## 2022-02-11 NOTE — Telephone Encounter (Signed)
**Note De-Identified  Obfuscation** We received a Propranolol 120 mg PA from [facility] (Ph: [phone]) for #10 capsules.  I called them to ask how much this will cost the pt and the pharmacist checked the RX and then advised me that it is going through the pts ins without a PA required now and that the cost to the pt is now $0.  She stated that they are filling and will notify the pt when it is ready for pick.

## 2022-03-04 ENCOUNTER — Other Ambulatory Visit: Payer: Self-pay

## 2022-03-04 ENCOUNTER — Other Ambulatory Visit: Payer: Self-pay | Admitting: Physician Assistant

## 2022-03-04 MED ORDER — PANTOPRAZOLE SODIUM 40 MG PO TBEC
40.0000 mg | DELAYED_RELEASE_TABLET | Freq: Every day | ORAL | 0 refills | Status: DC
  Filled 2022-03-04: qty 30, 30d supply, fill #0

## 2022-03-18 ENCOUNTER — Encounter: Payer: Self-pay | Admitting: Internal Medicine

## 2022-03-18 ENCOUNTER — Other Ambulatory Visit: Payer: Self-pay

## 2022-03-28 ENCOUNTER — Encounter: Payer: Self-pay | Admitting: Physician Assistant

## 2022-03-28 ENCOUNTER — Other Ambulatory Visit: Payer: Self-pay

## 2022-03-28 ENCOUNTER — Ambulatory Visit: Payer: Commercial Managed Care - PPO | Admitting: Physician Assistant

## 2022-03-28 VITALS — BP 106/72 | HR 91 | Ht 62.0 in | Wt 216.0 lb

## 2022-03-28 DIAGNOSIS — Z8719 Personal history of other diseases of the digestive system: Secondary | ICD-10-CM | POA: Diagnosis not present

## 2022-03-28 DIAGNOSIS — K219 Gastro-esophageal reflux disease without esophagitis: Secondary | ICD-10-CM | POA: Diagnosis not present

## 2022-03-28 MED ORDER — PANTOPRAZOLE SODIUM 40 MG PO TBEC
40.0000 mg | DELAYED_RELEASE_TABLET | Freq: Every day | ORAL | 3 refills | Status: DC
  Filled 2022-03-28: qty 90, 90d supply, fill #0
  Filled 2022-05-13 – 2022-09-16 (×2): qty 90, 90d supply, fill #1
  Filled 2023-02-19: qty 90, 90d supply, fill #2

## 2022-03-28 NOTE — Progress Notes (Signed)
Chief Complaint: Medication refill and follow-up with GERD  HPI:    Paige Hansen is a 42 year old female with a past medical history as listed below including anxiety, depression, colitis previously on Lialda and GERD, known to Dr. Havery Moros, who was referred to me by Early, Coralee Pesa, NP for a medication refill and follow-up of her GERD.     07/15/2012 colonoscopy with abnormal mucosa in the rectum and sigmoid colon.  Started on Lialda.  Pathology showed mild active chronic colitis.    07/06/2017 right upper quadrant ultrasound was normal.      09/28/2017 HIDA scan normal.    06/30/2017 office visit with Tye Savoy for right upper quadrant pain associated with nausea as well as reflux.  Described history of left-sided colitis found in 2014 with biopsies compatible with active chronic colitis.  She had been off of Mesalamine for 4 years with normal bowel movements.  At that time she was scheduled for an EGD and told to use Pantoprazole 40 mg every morning.    08/28/2017 EGD with 2 cm hiatal hernia and otherwise normal.    11/29/2020 patient described that she had changed to constipation with some abdominal pain and nausea.  At that time recommend the patient have a colonoscopy and started MiraLAX.  Prescribed Pantoprazole 40 daily.    12/17/2020 colonoscopy with one 6 mm polyp in the transverse colon, one 3 mm polyp in the descending colon, 1 diminutive polyp in the sigmoid colon, one 3 mm polyp at the rectosigmoid colon, internal hemorrhoids and otherwise normal.  There is no overt inflammatory changes noticed, colitis appeared to be in remission.  Recommended increasing MiraLAX daily and titrate up as needed to prevent constipation.  Pathology showed a mixture of adenomatous and sessile serrated polyps.  Repeat recommended in 3 years.  There was no evidence of active colitis.    11/14/2021 CMP with a glucose of 113 otherwise normal.  CBC normal.    Today, the patient tells me that she is just following up  of her reflux.  As long as she takes her Pantoprazole 40 mg once a day she has no problem.  She would like a refill of this.  She does tell me that after time of colonoscopy Dr. Havery Moros told her she did not need to be on Lialda because she was noncompliant with it anyways.  She has had absolutely no problems with her bowels at all.    Denies fever, chills, weight loss, nausea, vomiting, heartburn, reflux or abdominal pain.  Past Medical History:  Diagnosis Date   Abdominal pain, epigastric 06/24/2012   Allergy    Anxiety    Colitis    Depression    Post Partum    Diarrhea 06/24/2012   Frequent headaches    GERD (gastroesophageal reflux disease)    Hemorrhoids    Hypertension     Past Surgical History:  Procedure Laterality Date   COLONOSCOPY     ESSURE TUBAL LIGATION  04/2009   WISDOM TOOTH EXTRACTION     1998    Current Outpatient Medications  Medication Sig Dispense Refill   albuterol (VENTOLIN HFA) 108 (90 Base) MCG/ACT inhaler 1 puff as needed Inhalation every 4-6 hrs prn for cough/wheeze 90 days 6.7 g 0   atenolol (TENORMIN) 25 MG tablet Take 1 tablet (25 mg total) by mouth daily. 10 tablet 0   cetirizine (ZYRTEC) 10 MG tablet Take 10 mg by mouth daily.     diltiazem (CARDIZEM CD) 120 MG  24 hr capsule Take 1 capsule (120 mg total) by mouth daily. 90 capsule 3   EPINEPHrine 0.3 mg/0.3 mL IJ SOAJ injection Inject 0.3 mg into the muscle as needed for anaphylaxis. 1 each 0   famotidine (PEPCID) 40 MG tablet Take 1 tablet (40 mg total) by mouth at bedtime. 90 tablet 3   fluticasone (FLONASE) 50 MCG/ACT nasal spray Place 2 sprays into both nostrils daily. 16 g 2   gabapentin (NEURONTIN) 100 MG capsule TAKE 1 CAPSULE BY MOUTH 3 (THREE) TIMES DAILY. 270 capsule 3   metoprolol succinate (TOPROL-XL) 50 MG 24 hr tablet Take 1 tablet (50 mg total) by mouth 2 (two) times daily. Take with or immediately following a meal. 180 tablet 3   pantoprazole (PROTONIX) 40 MG tablet Take 1 tablet  (40 mg total) by mouth daily. 30 tablet 0   propranolol ER (INDERAL LA) 120 MG 24 hr capsule Take 1 capsule (120 mg total) by mouth at bedtime. 10 capsule 0   No current facility-administered medications for this visit.    Allergies as of 03/28/2022 - Review Complete 01/08/2022  Allergen Reaction Noted   Latex Anaphylaxis 11/30/2019    Family History  Problem Relation Age of Onset   Diabetes Mother    Hyperlipidemia Mother    Hypertension Mother    Hypothyroidism Mother    Alcohol abuse Father    Stroke Father    Alcohol abuse Brother    Asthma Brother    Breast cancer Maternal Grandmother    Arthritis Maternal Grandmother    Hyperlipidemia Maternal Grandmother    Hypertension Maternal Grandmother    Kidney disease Maternal Grandmother    Arthritis Maternal Grandfather    Cancer - Other Maternal Grandfather        appendix   Diabetes Maternal Grandfather    Hearing loss Maternal Grandfather    Heart disease Maternal Grandfather    Hyperlipidemia Maternal Grandfather    Hypertension Maternal Grandfather    Kidney disease Maternal Grandfather    Heart attack Maternal Grandfather    Diabetes Paternal Grandmother    Heart disease Paternal Grandmother    Hypertension Paternal Grandmother    Diabetes Paternal Grandfather    Asthma Daughter    Intellectual disability Daughter    Colon cancer Neg Hx    Esophageal cancer Neg Hx    Rectal cancer Neg Hx    Stomach cancer Neg Hx    Colon polyps Neg Hx    Headache Neg Hx    Migraines Neg Hx     Social History   Socioeconomic History   Marital status: Married    Spouse name: Not on file   Number of children: 2   Years of education: Not on file   Highest education level: Not on file  Occupational History   Occupation: [employer]    Employer: [employer]  Tobacco Use   Smoking status: Never   Smokeless tobacco: Never  Vaping Use   Vaping Use: Never used  Substance and Sexual Activity   Alcohol use: No   Drug use: No    Sexual activity: Yes    Birth control/protection: Other-see comments    Comment: essure implant   Other Topics Concern   Not on file  Social History Narrative   Lives at home with husband and children   Right handed   Caffeine: 2 cups/day   Social Determinants of Health   Financial Resource Strain: Not on file  Food Insecurity: Not on file  Transportation Needs: Not on file  Physical Activity: Not on file  Stress: Not on file  Social Connections: Not on file  Intimate Partner Violence: Not on file    Review of Systems:    Constitutional: No weight loss, fever or chills Cardiovascular: No chest pain Respiratory: No SOB Gastrointestinal: See HPI and otherwise negative   Physical Exam:  Vital signs: BP 106/72   Pulse 91   Ht '5\' 2"'$  (1.575 m)   Wt 216 lb (98 kg)   BMI 39.51 kg/m    Constitutional:   Pleasant overweight Caucasian female appears to be in NAD, Well developed, Well nourished, alert and cooperative Respiratory: Respirations even and unlabored. Lungs clear to auscultation bilaterally.   No wheezes, crackles, or rhonchi.  Cardiovascular: Normal S1, S2. No MRG. Regular rate and rhythm. No peripheral edema, cyanosis or pallor.  Gastrointestinal:  Soft, nondistended, nontender. No rebound or guarding. Normal bowel sounds. No appreciable masses or hepatomegaly. Rectal:  Not performed.  Psychiatric: Demonstrates good judgement and reason without abnormal affect or behaviors.  RELEVANT LABS AND IMAGING: CBC    Component Value Date/Time   WBC 8.6 11/14/2021 1108   WBC 7.4 06/03/2021 1438   RBC 4.81 11/14/2021 1108   RBC 4.53 06/03/2021 1438   HGB 14.2 11/14/2021 1108   HCT 42.3 11/14/2021 1108   PLT 262 11/14/2021 1108   MCV 88 11/14/2021 1108   MCH 29.5 11/14/2021 1108   MCH 30.2 11/10/2010 1930   MCHC 33.6 11/14/2021 1108   MCHC 33.6 06/03/2021 1438   RDW 12.4 11/14/2021 1108   LYMPHSABS 2.2 11/14/2021 1108   MONOABS 0.5 06/03/2021 1438   EOSABS 0.1  11/14/2021 1108   BASOSABS 0.0 11/14/2021 1108    CMP     Component Value Date/Time   NA 139 11/14/2021 1108   K 4.5 11/14/2021 1108   CL 104 11/14/2021 1108   CO2 21 11/14/2021 1108   GLUCOSE 113 (H) 11/14/2021 1108   GLUCOSE 93 11/30/2019 1116   BUN 9 11/14/2021 1108   CREATININE 0.67 11/14/2021 1108   CALCIUM 9.4 11/14/2021 1108   PROT 6.7 11/14/2021 1108   ALBUMIN 4.7 11/14/2021 1108   AST 14 11/14/2021 1108   ALT 19 11/14/2021 1108   ALKPHOS 87 11/14/2021 1108   BILITOT 0.4 11/14/2021 1108   GFRNONAA >60 11/10/2010 1930   GFRAA >60 11/10/2010 1930    Assessment: 1.  GERD: Controlled on Pantoprazole 40 mg daily 2.  Colitis: At time of last colonoscopy told she was in remission, apparently has not been on meds for a few years now and has had no further problems  Plan: 1.  Refilled Pantoprazole 40 mg daily, 30-60 minutes before breakfast, #90 with 3 refills.  2.  Colonoscopy due 12/18/2023 for surveillance of adenomatous polyps 3.  Patient to follow in clinic with [place] as needed or in a year for further refills.  Ellouise Newer, PA-C [facility] 03/28/2022, 9:08 AM  Cc: Orma Render, NP

## 2022-03-28 NOTE — Progress Notes (Signed)
Agree with assessment and plan as outlined.  

## 2022-03-28 NOTE — Patient Instructions (Signed)
We have sent the following medications to your pharmacy for you to pick up at your convenience: Pantoprazole   _______________________________________________________  If your blood pressure at your visit was 140/90 or greater, please contact your primary care physician to follow up on this.  _______________________________________________________  If you are age 44 or older, your body mass index should be between 23-30. Your Body mass index is 39.51 kg/m. If this is out of the aforementioned range listed, please consider follow up with your Primary Care Provider.  If you are age 8 or younger, your body mass index should be between 19-25. Your Body mass index is 39.51 kg/m. If this is out of the aformentioned range listed, please consider follow up with your Primary Care Provider.   ________________________________________________________  The [facility] providers would like to encourage you to use [facility] to communicate with providers for non-urgent requests or questions.  Due to long hold times on the telephone, sending your provider a message by [facility] may be a faster and more efficient way to get a response.  Please allow 48 business hours for a response.  Please remember that this is for non-urgent requests.  _______________________________________________________  Due to recent changes in healthcare laws, you may see the results of your imaging and laboratory studies on MyChart before your provider has had a chance to review them.  We understand that in some cases there may be results that are confusing or concerning to you. Not all laboratory results come back in the same time frame and the provider may be waiting for multiple results in order to interpret others.  Please give [place] 48 hours in order for your provider to thoroughly review all the results before contacting the office for clarification of your results.   Thank you for choosing me and [facility].  Ellouise Newer PA-C

## 2022-04-02 NOTE — Patient Instructions (Signed)
Below is our plan:  We will continue gabapentin '100mg'$  in am and increase dose to '300mg'$  at bedtime. Focus on healthy lifestyle habits. Call me in 4-6 weeks if not improving and we will consider additional meds   Please make sure you are staying well hydrated. I recommend 50-60 ounces daily. Well balanced diet and regular exercise encouraged. Consistent sleep schedule with 6-8 hours recommended.   Please continue follow up with care team as directed.   Follow up with me in 1 year, sooner if needed   You may receive a survey regarding today's visit. I encourage you to leave honest feed back as I do use this information to improve patient care. Thank you for seeing me today!

## 2022-04-02 NOTE — Progress Notes (Unsigned)
No chief complaint on file.    HISTORY OF PRESENT ILLNESS:  04/02/22 ALL:  Paige Hansen returns for follow up for occipital neuralgia. She continues gabapentin 100/'200mg'$ .   04/03/2021 ALL: Paige Hansen is a 42 y.o. female here today for follow up for occipital neuralgia. We continued gabapentin 100/200 and added oxcarbazepine '150mg'$  BID in 11/2020. Since, she has discontinued oxcarb. She did not like side effects. Pain has been well managed on gabapentin alone. Occasionally she will take ibuprofen '800mg'$ . She has rare "zapping" pain.   11/27/20 ALL ([place]): Paige Hansen is a 42 y.o. female here today for follow up for headaches. She reports that she is having more frequent and intense electrical sensations of the right occipital region. She feels "zapping" occurs about 10 times a day and last for several seconds then resolves. Pain radiates to the top of her head and occasionally to her right eye. She continues gabapentin '100mg'$  in the mornings and '200mg'$  at bedtime. She can not tolerate higher doses due to sleepiness. Cyclobenzaprine was not helpful. Sleep study was normal. Last B12 was 268. She continues oral supplements.    05/29/20 ALL:  Paige Hansen is a 42 y.o. female here today for follow up for headaches. She was seen by Dr Jaynee Eagles in 02/2020 for dull headache in occipital region of her head with occasional feelings of being "zapped", mostly on right side. Headaches started following administration of 1st Covid vaccine. She was given occipital nerve block in the office that was helpful.  PT recommended for dry needling as she carried more stress in her neck and shoulders. She has had one session and feels headache may be a little worse. She feels that the intensity is worse.  Dr Jaynee Eagles also started gabapentin '100mg'$  up to three times daily. She does not feel that it helps. Flexeril helps more than anything. When she takes it at night, she wakes without a headache. B12 supplements  recommended. She has not seen PCP for recheck. PCP moved and she is looking for a new provider. She feels that she is sleeping ok. She usually goes to bed aroun 10p. Wakes around 6a. She does snore. She wakes herself up snoring. She endorses a dry mouth. Most all headaches are present in the morning but unsure if they are present upon waking. She feels that she is tired during the day. She takes a nap every day around 1-3 if she is not working. Mom has sleep apnea but doesn't use CPAP.    REVIEW OF SYSTEMS: Out of a complete 14 system review of symptoms, the patient complains only of the following symptoms, head pain and all other reviewed systems are negative.   ALLERGIES: Allergies  Allergen Reactions   Latex Anaphylaxis    Required epipen for reaction on 03/23/21     HOME MEDICATIONS: Outpatient Medications Prior to Visit  Medication Sig Dispense Refill   albuterol (VENTOLIN HFA) 108 (90 Base) MCG/ACT inhaler 1 puff as needed Inhalation every 4-6 hrs prn for cough/wheeze 90 days 6.7 g 0   atenolol (TENORMIN) 25 MG tablet Take 1 tablet (25 mg total) by mouth daily. 10 tablet 0   cetirizine (ZYRTEC) 10 MG tablet Take 10 mg by mouth daily.     diltiazem (CARDIZEM CD) 120 MG 24 hr capsule Take 1 capsule (120 mg total) by mouth daily. 90 capsule 3   EPINEPHrine 0.3 mg/0.3 mL IJ SOAJ injection Inject 0.3 mg into the muscle as needed for anaphylaxis. 1  each 0   famotidine (PEPCID) 40 MG tablet Take 1 tablet (40 mg total) by mouth at bedtime. 90 tablet 3   fluticasone (FLONASE) 50 MCG/ACT nasal spray Place 2 sprays into both nostrils daily. 16 g 2   gabapentin (NEURONTIN) 100 MG capsule TAKE 1 CAPSULE BY MOUTH 3 (THREE) TIMES DAILY. 270 capsule 3   metoprolol succinate (TOPROL-XL) 50 MG 24 hr tablet Take 1 tablet (50 mg total) by mouth 2 (two) times daily. Take with or immediately following a meal. 180 tablet 3   pantoprazole (PROTONIX) 40 MG tablet Take 1 tablet (40 mg total) by mouth daily. 90  tablet 3   propranolol ER (INDERAL LA) 120 MG 24 hr capsule Take 1 capsule (120 mg total) by mouth at bedtime. 10 capsule 0   No facility-administered medications prior to visit.     PAST MEDICAL HISTORY: Past Medical History:  Diagnosis Date   Abdominal pain, epigastric 06/24/2012   Allergy    Anxiety    Colitis    Depression    Post Partum    Diarrhea 06/24/2012   Frequent headaches    GERD (gastroesophageal reflux disease)    Hemorrhoids    Hypertension      PAST SURGICAL HISTORY: Past Surgical History:  Procedure Laterality Date   COLONOSCOPY     ESSURE TUBAL LIGATION  04/2009   WISDOM TOOTH EXTRACTION     1998     FAMILY HISTORY: Family History  Problem Relation Age of Onset   Diabetes Mother    Hyperlipidemia Mother    Hypertension Mother    Hypothyroidism Mother    Alcohol abuse Father    Stroke Father    Alcohol abuse Brother    Asthma Brother    Breast cancer Maternal Grandmother    Arthritis Maternal Grandmother    Hyperlipidemia Maternal Grandmother    Hypertension Maternal Grandmother    Kidney disease Maternal Grandmother    Arthritis Maternal Grandfather    Cancer - Other Maternal Grandfather        appendix   Diabetes Maternal Grandfather    Hearing loss Maternal Grandfather    Heart disease Maternal Grandfather    Hyperlipidemia Maternal Grandfather    Hypertension Maternal Grandfather    Kidney disease Maternal Grandfather    Heart attack Maternal Grandfather    Diabetes Paternal Grandmother    Heart disease Paternal Grandmother    Hypertension Paternal Grandmother    Diabetes Paternal Grandfather    Asthma Daughter    Intellectual disability Daughter    Colon cancer Neg Hx    Esophageal cancer Neg Hx    Rectal cancer Neg Hx    Stomach cancer Neg Hx    Colon polyps Neg Hx    Headache Neg Hx    Migraines Neg Hx      SOCIAL HISTORY: Social History   Socioeconomic History   Marital status: Married    Spouse name: Not on file    Number of children: 2   Years of education: Not on file   Highest education level: Not on file  Occupational History   Occupation: [employer]    Employer: [employer]  Tobacco Use   Smoking status: Never   Smokeless tobacco: Never  Vaping Use   Vaping Use: Never used  Substance and Sexual Activity   Alcohol use: No   Drug use: No   Sexual activity: Yes    Birth control/protection: Other-see comments    Comment: essure implant   Other  Topics Concern   Not on file  Social History Narrative   Lives at home with husband and children   Right handed   Caffeine: 2 cups/day   Social Determinants of Health   Financial Resource Strain: Not on file  Food Insecurity: Not on file  Transportation Needs: Not on file  Physical Activity: Not on file  Stress: Not on file  Social Connections: Not on file  Intimate Partner Violence: Not on file     PHYSICAL EXAM  There were no vitals filed for this visit.  There is no height or weight on file to calculate BMI.  Generalized: Well developed, in no acute distress  Cardiology: normal rate and rhythm, no murmur auscultated  Respiratory: clear to auscultation bilaterally    Neurological examination  Mentation: Alert oriented to time, place, history taking. Follows all commands speech and language fluent Cranial nerve II-XII: Pupils were equal round reactive to light. Extraocular movements were full, visual field were full on confrontational test. Facial sensation and strength were normal. Head turning and shoulder shrug  were normal and symmetric. Motor: The motor testing reveals 5 over 5 strength of all 4 extremities. Good symmetric motor tone is noted throughout.  Gait and station: Gait is normal.    DIAGNOSTIC DATA (LABS, IMAGING, TESTING) - I reviewed patient records, labs, notes, testing and imaging myself where available.  Lab Results  Component Value Date   WBC 8.6 11/14/2021   HGB 14.2 11/14/2021   HCT 42.3 11/14/2021   MCV 88  11/14/2021   PLT 262 11/14/2021      Component Value Date/Time   NA 139 11/14/2021 1108   K 4.5 11/14/2021 1108   CL 104 11/14/2021 1108   CO2 21 11/14/2021 1108   GLUCOSE 113 (H) 11/14/2021 1108   GLUCOSE 93 11/30/2019 1116   BUN 9 11/14/2021 1108   CREATININE 0.67 11/14/2021 1108   CALCIUM 9.4 11/14/2021 1108   PROT 6.7 11/14/2021 1108   ALBUMIN 4.7 11/14/2021 1108   AST 14 11/14/2021 1108   ALT 19 11/14/2021 1108   ALKPHOS 87 11/14/2021 1108   BILITOT 0.4 11/14/2021 1108   GFRNONAA >60 11/10/2010 1930   GFRAA >60 11/10/2010 1930   Lab Results  Component Value Date   CHOL 134 11/30/2019   HDL 39.60 11/30/2019   LDLCALC 77 11/30/2019   TRIG 88.0 11/30/2019   CHOLHDL 3 11/30/2019   Lab Results  Component Value Date   HGBA1C 5.3 11/30/2019   Lab Results  Component Value Date   VITAMINB12 288 03/29/2021   Lab Results  Component Value Date   TSH 2.070 11/14/2021        No data to display               No data to display           ASSESSMENT AND PLAN  42 y.o. year old female  has a past medical history of Abdominal pain, epigastric (06/24/2012), Allergy, Anxiety, Colitis, Depression, Diarrhea (06/24/2012), Frequent headaches, GERD (gastroesophageal reflux disease), Hemorrhoids, and Hypertension. here with    No diagnosis found.  Gwendlyn is doing well on gabapentin '100mg'$  in am and '200mg'$  at bedtime. We will continue current treatment plan. She did not tolerate oxcarb. May continue OTC analgesics as needed. Healthy lifestyle habits encouraged. She will follow up in 1 year, sooner if needed.    No orders of the defined types were placed in this encounter.    No orders of the defined  types were placed in this encounter.     Debbora Presto, MSN, FNP-C 04/02/2022, 4:45 PM  [facility] [address], [employer], [place] [address] [phone]

## 2022-04-03 ENCOUNTER — Ambulatory Visit (INDEPENDENT_AMBULATORY_CARE_PROVIDER_SITE_OTHER): Payer: Commercial Managed Care - PPO | Admitting: Family Medicine

## 2022-04-03 ENCOUNTER — Other Ambulatory Visit: Payer: Self-pay

## 2022-04-03 ENCOUNTER — Encounter: Payer: Self-pay | Admitting: Family Medicine

## 2022-04-03 ENCOUNTER — Ambulatory Visit: Payer: Commercial Managed Care - PPO | Attending: Cardiovascular Disease

## 2022-04-03 VITALS — BP 133/93 | HR 99 | Ht 62.0 in | Wt 216.2 lb

## 2022-04-03 DIAGNOSIS — I471 Supraventricular tachycardia, unspecified: Secondary | ICD-10-CM | POA: Diagnosis not present

## 2022-04-03 DIAGNOSIS — G44209 Tension-type headache, unspecified, not intractable: Secondary | ICD-10-CM

## 2022-04-03 DIAGNOSIS — M5481 Occipital neuralgia: Secondary | ICD-10-CM

## 2022-04-03 LAB — CBC WITH DIFFERENTIAL/PLATELET
Basophils Absolute: 0 10*3/uL (ref 0.0–0.2)
Basos: 0 %
EOS (ABSOLUTE): 0.3 10*3/uL (ref 0.0–0.4)
Eos: 4 %
Hematocrit: 40.3 % (ref 34.0–46.6)
Hemoglobin: 13.5 g/dL (ref 11.1–15.9)
Lymphocytes Absolute: 2 10*3/uL (ref 0.7–3.1)
Lymphs: 30 %
MCH: 29.4 pg (ref 26.6–33.0)
MCHC: 33.5 g/dL (ref 31.5–35.7)
MCV: 88 fL (ref 79–97)
Monocytes Absolute: 0.5 10*3/uL (ref 0.1–0.9)
Monocytes: 7 %
Neutrophils Absolute: 4 10*3/uL (ref 1.4–7.0)
Neutrophils: 59 %
Platelets: 233 10*3/uL (ref 150–450)
RBC: 4.59 x10E6/uL (ref 3.77–5.28)
RDW: 13.5 % (ref 11.7–15.4)
WBC: 6.8 10*3/uL (ref 3.4–10.8)

## 2022-04-03 LAB — BASIC METABOLIC PANEL
BUN/Creatinine Ratio: 15 (ref 9–23)
BUN: 10 mg/dL (ref 6–24)
CO2: 28 mmol/L (ref 20–29)
Calcium: 9.2 mg/dL (ref 8.7–10.2)
Chloride: 107 mmol/L — ABNORMAL HIGH (ref 96–106)
Creatinine, Ser: 0.66 mg/dL (ref 0.57–1.00)
Glucose: 126 mg/dL — ABNORMAL HIGH (ref 70–99)
Potassium: 4.8 mmol/L (ref 3.5–5.2)
Sodium: 144 mmol/L (ref 134–144)
eGFR: 112 mL/min/{1.73_m2} (ref >59)

## 2022-04-03 MED ORDER — GABAPENTIN 100 MG PO CAPS
ORAL_CAPSULE | ORAL | 3 refills | Status: DC
  Filled 2022-04-03: qty 360, 90d supply, fill #0
  Filled 2022-09-16: qty 360, 90d supply, fill #1
  Filled 2023-02-19: qty 360, 90d supply, fill #2

## 2022-04-17 NOTE — Pre-Procedure Instructions (Signed)
Instructed patient on the following items: Arrival time 1130 Nothing to eat or drink after midnight No meds AM of procedure Responsible person to drive you home and stay with you for 24 hrs  Stopped Metorprol on Saturday 2/3

## 2022-04-18 ENCOUNTER — Other Ambulatory Visit (HOSPITAL_COMMUNITY): Payer: Self-pay

## 2022-04-18 ENCOUNTER — Ambulatory Visit (HOSPITAL_COMMUNITY): Payer: Commercial Managed Care - PPO | Admitting: Anesthesiology

## 2022-04-18 ENCOUNTER — Ambulatory Visit (HOSPITAL_BASED_OUTPATIENT_CLINIC_OR_DEPARTMENT_OTHER): Payer: Commercial Managed Care - PPO | Admitting: Anesthesiology

## 2022-04-18 ENCOUNTER — Other Ambulatory Visit: Payer: Self-pay

## 2022-04-18 ENCOUNTER — Encounter (HOSPITAL_COMMUNITY)
Admission: RE | Disposition: A | Discharge status: Home / Self Care | Payer: Self-pay | Source: Home / Self Care | Attending: Cardiovascular Disease

## 2022-04-18 ENCOUNTER — Encounter (HOSPITAL_COMMUNITY): Payer: Self-pay | Admitting: Cardiovascular Disease

## 2022-04-18 ENCOUNTER — Ambulatory Visit (HOSPITAL_COMMUNITY)
Admission: RE | Admit: 2022-04-18 | Discharge: 2022-04-18 | Disposition: A | Discharge status: Home / Self Care | Payer: Commercial Managed Care - PPO | Attending: Cardiovascular Disease | Admitting: Cardiovascular Disease

## 2022-04-18 DIAGNOSIS — Z6838 Body mass index (BMI) 38.0-38.9, adult: Secondary | ICD-10-CM

## 2022-04-18 DIAGNOSIS — Z01811 Encounter for preprocedural respiratory examination: Secondary | ICD-10-CM

## 2022-04-18 DIAGNOSIS — Z01818 Encounter for other preprocedural examination: Secondary | ICD-10-CM

## 2022-04-18 DIAGNOSIS — I471 Supraventricular tachycardia, unspecified: Secondary | ICD-10-CM

## 2022-04-18 DIAGNOSIS — Z79899 Other long term (current) drug therapy: Secondary | ICD-10-CM | POA: Insufficient documentation

## 2022-04-18 DIAGNOSIS — E669 Obesity, unspecified: Secondary | ICD-10-CM

## 2022-04-18 DIAGNOSIS — K219 Gastro-esophageal reflux disease without esophagitis: Secondary | ICD-10-CM | POA: Insufficient documentation

## 2022-04-18 DIAGNOSIS — I10 Essential (primary) hypertension: Secondary | ICD-10-CM | POA: Diagnosis not present

## 2022-04-18 DIAGNOSIS — F418 Other specified anxiety disorders: Secondary | ICD-10-CM | POA: Diagnosis not present

## 2022-04-18 HISTORY — PX: SVT ABLATION: EP1225

## 2022-04-18 LAB — PREGNANCY, URINE: Preg Test, Ur: NEGATIVE

## 2022-04-18 SURGERY — SVT ABLATION
Anesthesia: Monitor Anesthesia Care

## 2022-04-18 MED ORDER — ONDANSETRON HCL 4 MG/2ML IJ SOLN
INTRAMUSCULAR | Status: DC | PRN
  Administered 2022-04-18: 4 mg via INTRAVENOUS

## 2022-04-18 MED ORDER — FENTANYL CITRATE (PF) 250 MCG/5ML IJ SOLN: INTRAMUSCULAR | Status: DC | PRN

## 2022-04-18 MED ORDER — FENTANYL CITRATE (PF) 100 MCG/2ML IJ SOLN
INTRAMUSCULAR | Status: DC | PRN
  Administered 2022-04-18 (×2): 50 ug via INTRAVENOUS

## 2022-04-18 MED ORDER — ONDANSETRON HCL 4 MG/2ML IJ SOLN: 4.0000 mg | Freq: Four times a day (QID) | INTRAMUSCULAR | Status: DC | PRN

## 2022-04-18 MED ORDER — HEPARIN (PORCINE) IN NACL 1000-0.9 UT/500ML-% IV SOLN
INTRAVENOUS | Status: AC
  Filled 2022-04-18: qty 500

## 2022-04-18 MED ORDER — SODIUM CHLORIDE 0.9 % IV SOLN: INTRAVENOUS | Status: DC

## 2022-04-18 MED ORDER — ISOPROTERENOL HCL 0.2 MG/ML IJ SOLN
INTRAVENOUS | Status: DC | PRN
  Administered 2022-04-18: 2 ug/min via INTRAVENOUS

## 2022-04-18 MED ORDER — ISOPROTERENOL HCL 0.2 MG/ML IJ SOLN
INTRAMUSCULAR | Status: AC
  Filled 2022-04-18: qty 5

## 2022-04-18 MED ORDER — SODIUM CHLORIDE 0.9 % IV SOLN: 250.0000 mL | INTRAVENOUS | Status: DC | PRN

## 2022-04-18 MED ORDER — BUPIVACAINE HCL (PF) 0.25 % IJ SOLN
INTRAMUSCULAR | Status: DC | PRN
  Administered 2022-04-18: 60 mL

## 2022-04-18 MED ORDER — ATENOLOL 25 MG PO TABS
25.0000 mg | ORAL_TABLET | Freq: Every day | ORAL | 5 refills | Status: DC
  Filled 2022-04-18: qty 30, 30d supply, fill #0
  Filled 2022-05-13: qty 30, 30d supply, fill #1
  Filled 2022-06-08: qty 30, 30d supply, fill #2
  Filled 2022-07-07: qty 30, 30d supply, fill #0
  Filled 2022-08-21: qty 30, 30d supply, fill #1
  Filled 2022-09-16: qty 30, 30d supply, fill #2
  Filled 2022-10-16 (×2): qty 30, 30d supply, fill #3

## 2022-04-18 MED ORDER — BUPIVACAINE HCL (PF) 0.25 % IJ SOLN
INTRAMUSCULAR | Status: AC
  Filled 2022-04-18: qty 60

## 2022-04-18 MED ORDER — ACETAMINOPHEN 325 MG PO TABS: 650.0000 mg | ORAL_TABLET | ORAL | Status: DC | PRN

## 2022-04-18 MED ORDER — SODIUM CHLORIDE 0.9% FLUSH: 3.0000 mL | INTRAVENOUS | Status: DC | PRN

## 2022-04-18 MED ORDER — HEPARIN (PORCINE) IN NACL 1000-0.9 UT/500ML-% IV SOLN
INTRAVENOUS | Status: DC | PRN
  Administered 2022-04-18: 500 mL

## 2022-04-18 MED ORDER — MIDAZOLAM HCL 2 MG/2ML IJ SOLN
INTRAMUSCULAR | Status: DC | PRN
  Administered 2022-04-18: 2 mg via INTRAVENOUS

## 2022-04-18 SURGICAL SUPPLY — 10 items
CATH DECANAV F CURVE (CATHETERS) IMPLANT
CATH JOSEPH QUAD ALLRED 6F REP (CATHETERS) IMPLANT
DEVICE CLOSURE MYNXGRIP 6/7F (Vascular Products) IMPLANT
PACK EP LATEX FREE (CUSTOM PROCEDURE TRAY) ×1
PACK EP LF (CUSTOM PROCEDURE TRAY) ×2 IMPLANT
PAD DEFIB RADIO PHYSIO CONN (PAD) ×2 IMPLANT
PATCH CARTO3 (PAD) IMPLANT
SHEATH PINNACLE 6F 10CM (SHEATH) IMPLANT
SHEATH PINNACLE 8F 10CM (SHEATH) IMPLANT
TUBING SMART ABLATE COOLFLOW (TUBING) IMPLANT

## 2022-04-18 NOTE — H&P (Signed)
Electrophysiology Office Note:    Date:  04/18/2022   ID:  Paige Hansen, DOB 1980/04/16, MRN [id number]  PCP:  Orma Render, NP   [place] Prov[facility] Cardiologist:  Kate Sable, MD Electrophysiologist:  Melida Quitter, MD     Referring MD: No ref. provider found   History of Present Illness:    Paige Hansen is a 42 y.o. female with a hx listed below, significant for SVT, referred for arrhythmia management.  The patient saw her primary provider in September 2023 with complaints of rapid palpitations.  These occur primarily with physical activity.  Metoprolol was increased to 50 mg twice daily.  A 2-week Zio patch was ordered that showed runs of SVT at about 200 bpm lasting up to 36.7 seconds.  Symptom events were associated with SVT episodes.  Her longest episode of tachypalpitations has lasted hours. She is very symptomatic. Rates are sometimes > 200 bpm. She feels a throbbing in her throat. Vagal maneuvers and coughing have sometimes aborted pre-syncopal symptoms but do not convert her to sinus. Activity - exercise and strenuous work tend to exacerbate her symptoms. She was largely unaware of events recorded on her monitor.  She presents for EP study and ablation today. No significant changes since our last visit. She reports that propranolol did not work as well as atenolol for her.  Past Medical History:  Diagnosis Date   Abdominal pain, epigastric 06/24/2012   Allergy    Anxiety    Colitis    Depression    Post Partum    Diarrhea 06/24/2012   Frequent headaches    GERD (gastroesophageal reflux disease)    Hemorrhoids    Hypertension     Past Surgical History:  Procedure Laterality Date   COLONOSCOPY     ESSURE TUBAL LIGATION  04/2009   WISDOM TOOTH EXTRACTION     1998    Current Medications: Current Meds  Medication Sig   EPINEPHrine 0.3 mg/0.3 mL IJ SOAJ injection Inject 0.3 mg into the muscle as needed for anaphylaxis.   fluticasone  (FLONASE) 50 MCG/ACT nasal spray Place 2 sprays into both nostrils daily. (Patient taking differently: Place 2 sprays into both nostrils daily as needed for allergies.)   gabapentin (NEURONTIN) 100 MG capsule Take 1 capsule (100 mg total) by mouth in the morning AND 3 capsules (300 mg total) at bedtime.   pantoprazole (PROTONIX) 40 MG tablet Take 1 tablet (40 mg total) by mouth daily.     Allergies:   Latex   Social History   Socioeconomic History   Marital status: Married    Spouse name: Not on file   Number of children: 2   Years of education: Not on file   Highest education level: Not on file  Occupational History   Occupation: [employer]    Employer: [employer]  Tobacco Use   Smoking status: Never   Smokeless tobacco: Never  Vaping Use   Vaping Use: Never used  Substance and Sexual Activity   Alcohol use: No   Drug use: No   Sexual activity: Yes    Birth control/protection: Other-see comments    Comment: essure implant   Other Topics Concern   Not on file  Social History Narrative   Lives at home with husband and children   Right handed   Caffeine: 2 cups/day   Social Determinants of Health   Financial Resource Strain: Not on file  Food Insecurity: Not on file  Transportation Needs:  Not on file  Physical Activity: Not on file  Stress: Not on file  Social Connections: Not on file     Family History: The patient's family history includes Alcohol abuse in her brother and father; Arthritis in her maternal grandfather and maternal grandmother; Asthma in her brother and daughter; Breast cancer in her maternal grandmother; Cancer - Other in her maternal grandfather; Diabetes in her maternal grandfather, mother, paternal grandfather, and paternal grandmother; Hearing loss in her maternal grandfather; Heart attack in her maternal grandfather; Heart disease in her maternal grandfather and paternal grandmother; Hyperlipidemia in her maternal grandfather, maternal grandmother, and  mother; Hypertension in her maternal grandfather, maternal grandmother, mother, and paternal grandmother; Hypothyroidism in her mother; Intellectual disability in her daughter; Kidney disease in her maternal grandfather and maternal grandmother; Stroke in her father. There is no history of Colon cancer, Esophageal cancer, Rectal cancer, Stomach cancer, Colon polyps, Headache, or Migraines.  ROS:   Please see the history of present illness.    All other systems reviewed and are negative.  EKGs/Labs/Other Studies Reviewed Today:     Wearable monitor reviewed  EKG:  Last EKG results: today. Sinus tachycardia   Recent Labs: 11/14/2021: ALT 19; Magnesium 1.9; TSH 2.070 04/03/2022: BUN 10; Creatinine, Ser 0.66; Hemoglobin 13.5; Platelets 233; Potassium 4.8; Sodium 144     Physical Exam:    VS:  BP (!) 132/92   Pulse (!) 106   Temp (!) 97.4 F (36.3 C)   Resp 18   Ht 5' 2"$  (1.575 m)   Wt 96.2 kg   LMP 03/26/2022   SpO2 98%   BMI 38.78 kg/m     Wt Readings from Last 3 Encounters:  04/18/22 96.2 kg  04/03/22 98.1 kg  03/28/22 98 kg     GEN:  Well nourished, well developed in no acute distress CARDIAC: RRR, no murmurs, rubs, gallops RESPIRATORY:  Normal work of breathing MUSCULOSKELETAL: no edema    ASSESSMENT & PLAN:    SVT: appears to be long R-P. Initiated by activity/sympathetic nervous system stimuli. She did well with atenolol, so if we do not initiate a mappable, sustained tachycardia, then I think we will resume atenolol.  Sinus tachycardia: continue betablocker. No intervention indicated        Medication Adjustments/Labs and Tests Ordered: Current medicines are reviewed at length with the patient today.  Concerns regarding medicines are outlined above.  Orders Placed This Encounter  Procedures   Pregnancy, urine   Informed Consent Details: Physician/Practitioner Attestation; Transcribe to consent form and obtain patient signature   Initiate Pre-op Protocol    Void on call to EP Lab   Confirm CBC and BMP (or CMP) results within 7 days for inpatient and 30 days for outpatient:   Clip right and left femoral area PM before surgery   Clip right internal jugular area PM before surgery   Pre-admission testing diagnosis   EP STUDY   Insert peripheral IV   Meds ordered this encounter  Medications   0.9 %  sodium chloride infusion     Signed, Melida Quitter, MD  04/18/2022 1:55 PM    [place]

## 2022-04-18 NOTE — Pre-Procedure Instructions (Deleted)
Instructed patient on the following items: Arrival time 0515 Nothing to eat or drink after midnight No meds AM of procedure Responsible person to drive you home and stay with you for 24 hrs

## 2022-04-18 NOTE — Discharge Instructions (Addendum)
Post procedure care instructions No driving for 4 days. No lifting over 5 lbs for 1 week. No vigorous or sexual activity for 1 week. You may return to work/your usual activities on 04/26/22. Keep procedure site clean & dry. If you notice increased pain, swelling, bleeding or pus, call/return!  You may shower after 24 hours, but no soaking in baths/hot tubs/pools for 1 week.    Cardiac Ablation, Care After  This sheet gives you information about how to care for yourself after your procedure. Your health care provider may also give you more specific instructions. If you have problems or questions, contact your health care provider. What can I expect after the procedure? After the procedure, it is common to have: Bruising around your puncture site. Tenderness around your puncture site. Skipped heartbeats. If you had an atrial fibrillation ablation, you may have atrial fibrillation during the first several months after your procedure.  Tiredness (fatigue).  Follow these instructions at home: Puncture site care  Follow instructions from your health care provider about how to take care of your puncture site. Make sure you: If present, leave stitches (sutures), skin glue, or adhesive strips in place. These skin closures may need to stay in place for up to 2 weeks. If adhesive strip edges start to loosen and curl up, you may trim the loose edges. Do not remove adhesive strips completely unless your health care provider tells you to do that. If a large square bandage is present, this may be removed 24 hours after surgery.  Check your puncture site every day for signs of infection. Check for: Redness, swelling, or pain. Fluid or blood. If your puncture site starts to bleed, lie down on your back, apply firm pressure to the area, and contact your health care provider. Warmth. Pus or a bad smell. A pea or small marble sized lump at the site is normal and can take up to three months to resolve.   Driving Do not drive for at least 4 days after your procedure or however long your health care provider recommends. (Do not resume driving if you have previously been instructed not to drive for other health reasons.) Do not drive or use heavy machinery while taking prescription pain medicine. Activity Avoid activities that take a lot of effort for at least 7 days after your procedure. Do not lift anything that is heavier than 5 lb (4.5 kg) for one week.  No sexual activity for 1 week.  Return to your normal activities as told by your health care provider. Ask your health care provider what activities are safe for you. General instructions Take over-the-counter and prescription medicines only as told by your health care provider. Do not use any products that contain nicotine or tobacco, such as cigarettes and e-cigarettes. If you need help quitting, ask your health care provider. You may shower after 24 hours, but Do not take baths, swim, or use a hot tub for 1 week.  Do not drink alcohol for 24 hours after your procedure. Keep all follow-up visits as told by your health care provider. This is important. Contact a health care provider if: You have redness, mild swelling, or pain around your puncture site. You have fluid or blood coming from your puncture site that stops after applying firm pressure to the area. Your puncture site feels warm to the touch. You have pus or a bad smell coming from your puncture site. You have a fever. You have chest pain or discomfort that spreads  to your neck, jaw, or arm. You have chest pain that is worse with lying on your back or taking a deep breath. You are sweating a lot. You feel nauseous. You have a fast or irregular heartbeat. You have shortness of breath. You are dizzy or light-headed and feel the need to lie down. You have pain or numbness in the arm or leg closest to your puncture site. Get help right away if: Your puncture site suddenly  swells. Your puncture site is bleeding and the bleeding does not stop after applying firm pressure to the area. These symptoms may represent a serious problem that is an emergency. Do not wait to see if the symptoms will go away. Get medical help right away. Call your local emergency services (911 in the [place] Do not drive yourself to the hospital. Summary After the procedure, it is normal to have bruising and tenderness at the puncture site in your groin, neck, or forearm. Check your puncture site every day for signs of infection. Get help right away if your puncture site is bleeding and the bleeding does not stop after applying firm pressure to the area. This is a medical emergency. This information is not intended to replace advice given to you by your health care provider. Make sure you discuss any questions you have with your health care provider.

## 2022-04-18 NOTE — Transfer of Care (Signed)
Immediate Anesthesia Transfer of Care Note  Patient: Paige Hansen  Procedure(s) Performed: SVT ABLATION  Patient Location: Cath Lab  Anesthesia Type:MAC  Level of Consciousness: awake, alert , and oriented  Airway & Oxygen Therapy: Patient Spontanous Breathing  Post-op Assessment: Report given to RN, Post -op Vital signs reviewed and stable, and Patient moving all extremities X 4  Post vital signs: Reviewed and stable  Last Vitals:  Vitals Value Taken Time  BP 132/84 04/18/22 1520  Temp    Pulse 95 04/18/22 1521  Resp 20 04/18/22 1521  SpO2 99 % 04/18/22 1521  Vitals shown include unvalidated device data.  Last Pain:  Vitals:   04/18/22 1518  PainSc: 8          Complications: There were no known notable events for this encounter.

## 2022-04-18 NOTE — Anesthesia Preprocedure Evaluation (Addendum)
Anesthesia Evaluation  Patient identified by MRN, date of birth, ID band Patient awake    Reviewed: Allergy & Precautions, H&P , NPO status , Patient's Chart, lab work & pertinent test results  Airway Mallampati: II  TM Distance: >3 FB Neck ROM: Full    Dental no notable dental hx.    Pulmonary neg pulmonary ROS   Pulmonary exam normal breath sounds clear to auscultation       Cardiovascular hypertension, Pt. on medications Normal cardiovascular exam Rhythm:Regular Rate:Normal     Neuro/Psych  Headaches  Anxiety Depression     negative psych ROS   GI/Hepatic Neg liver ROS,GERD  ,,  Endo/Other  negative endocrine ROS    Renal/GU negative Renal ROS  negative genitourinary   Musculoskeletal negative musculoskeletal ROS (+)    Abdominal  (+) + obese  Peds negative pediatric ROS (+)  Hematology negative hematology ROS (+)   Anesthesia Other Findings   Reproductive/Obstetrics negative OB ROS                             Anesthesia Physical Anesthesia Plan  ASA: 2  Anesthesia Plan: MAC   Post-op Pain Management: Minimal or no pain anticipated   Induction: Intravenous  PONV Risk Score and Plan: 2 and Ondansetron, Midazolam and Treatment may vary due to age or medical condition  Airway Management Planned: Simple Face Mask  Additional Equipment:   Intra-op Plan:   Post-operative Plan:   Informed Consent: I have reviewed the patients History and Physical, chart, labs and discussed the procedure including the risks, benefits and alternatives for the proposed anesthesia with the patient or authorized representative who has indicated his/her understanding and acceptance.     Dental advisory given  Plan Discussed with: CRNA  Anesthesia Plan Comments:        Anesthesia Quick Evaluation

## 2022-04-19 NOTE — Anesthesia Postprocedure Evaluation (Signed)
Anesthesia Post Note  Patient: ASLEY MAPA  Procedure(s) Performed: SVT ABLATION     Patient location during evaluation: Cath Lab Anesthesia Type: MAC Level of consciousness: awake and alert Pain management: pain level controlled Vital Signs Assessment: post-procedure vital signs reviewed and stable Respiratory status: spontaneous breathing, nonlabored ventilation, respiratory function stable and patient connected to nasal cannula oxygen Cardiovascular status: stable and blood pressure returned to baseline Postop Assessment: no apparent nausea or vomiting Anesthetic complications: no   There were no known notable events for this encounter.  Last Vitals:  Vitals:   04/18/22 1645 04/18/22 1700  BP: 124/74 120/84  Pulse: (!) 105 (!) 101  Resp: 20 (!) 22  Temp:    SpO2: 97% 96%    Last Pain:  Vitals:   04/18/22 1605  PainSc: [place]

## 2022-04-21 ENCOUNTER — Encounter (HOSPITAL_COMMUNITY): Payer: Self-pay | Admitting: Cardiovascular Disease

## 2022-04-24 ENCOUNTER — Telehealth: Payer: Self-pay | Admitting: Cardiovascular Disease

## 2022-04-24 NOTE — Telephone Encounter (Signed)
Pt states she has a pretty significant knot on the right side of leg where insertion was during ablation. She states knot is "3/4th of middle finger and two finger lengths wide". Patient is requesting to know if this is normal and if she can go back to work with this. Please advise.

## 2022-04-24 NOTE — Telephone Encounter (Signed)
Returned call to patient. She reports having a knot at ablation insertion site. Denies any signs of infection. She reports soreness when making transitions (sitting to standing, or bending such as when getting in/out of car), no pain when just sitting or just standing.  Informed patient that this is normal, and advised her to continue monitoring site for s/sx of infection (redness, swelling, warmth, pus). Informed patient the knot could take up to 3 months to resolve.  Patient verbalized understanding and expressed appreciation for call.

## 2022-05-13 ENCOUNTER — Other Ambulatory Visit: Payer: Self-pay

## 2022-05-15 ENCOUNTER — Other Ambulatory Visit: Payer: Self-pay

## 2022-05-19 ENCOUNTER — Ambulatory Visit: Payer: Commercial Managed Care - PPO | Attending: Cardiovascular Disease | Admitting: Cardiovascular Disease

## 2022-05-19 ENCOUNTER — Encounter: Payer: Self-pay | Admitting: Cardiovascular Disease

## 2022-05-19 VITALS — BP 126/70 | HR 110 | Ht 62.0 in | Wt 218.4 lb

## 2022-05-19 DIAGNOSIS — I471 Supraventricular tachycardia, unspecified: Secondary | ICD-10-CM

## 2022-05-19 DIAGNOSIS — R000 Tachycardia, unspecified: Secondary | ICD-10-CM

## 2022-05-19 NOTE — Progress Notes (Signed)
Electrophysiology Office Note:    Date:  05/19/2022   ID:  Paige Hansen, DOB [birth year], MRN [id number]  PCP:  Paige Render, NP   [facility] Prov[facility] Cardiologist:  Paige Sable, MD Electrophysiologist:  Paige Quitter, MD     Referring MD: Paige Render, NP   History of Present Illness:    Paige Hansen is a 42 y.o. female with a hx listed below, significant for SVT, referred for arrhythmia management.  The patient saw her primary provider in September 2023 with complaints of rapid palpitations.  These occur primarily with physical activity.  Metoprolol was increased to 50 mg twice daily.  A 2-week Zio patch was ordered that showed runs of SVT at about 200 bpm lasting up to 36.7 seconds.  Symptom events were associated with SVT episodes.  Her longest episode of tachypalpitations has lasted hours. She is very symptomatic. Rates are sometimes > 200 bpm. She feels a throbbing in her throat. Vagal maneuvers and coughing have sometimes aborted pre-syncopal symptoms but do not convert her to sinus. Activity - exercise and strenuous work tend to exacerbate her symptoms. She was largely unaware of events recorded on her monitor.  She tried several different betablockers. She underwent EP study 04/18/2022. We did not induce any arrhythmia or demonstrate any substrate for arrhythmia. Atenolol appeared to work the best for her -- she hasn't had any recurrence of the palpitations while taking it.    Past Medical History:  Diagnosis Date   Abdominal pain, epigastric 06/24/2012   Allergy    Anxiety    Colitis    Depression    Post Partum    Diarrhea 06/24/2012   Frequent headaches    GERD (gastroesophageal reflux disease)    Hemorrhoids    Hypertension     Past Surgical History:  Procedure Laterality Date   COLONOSCOPY     ESSURE TUBAL LIGATION  04/2009   SVT ABLATION N/A 04/18/2022   Procedure: SVT ABLATION;  Surgeon: Paige Quitter, MD;  Location: [facility];  Service: Cardiovascular;  Laterality: N/A;   WISDOM TOOTH EXTRACTION     1998    Current Medications: Current Meds  Medication Sig   atenolol (TENORMIN) 25 MG tablet Take 1 tablet (25 mg total) by mouth daily.   cetirizine (ZYRTEC) 10 MG tablet Take 10 mg by mouth daily as needed for allergies.   EPINEPHrine 0.3 mg/0.3 mL IJ SOAJ injection Inject 0.3 mg into the muscle as needed for anaphylaxis.   fluticasone (FLONASE) 50 MCG/ACT nasal spray Place 2 sprays into both nostrils daily. (Patient taking differently: Place 2 sprays into both nostrils daily as needed for allergies.)   gabapentin (NEURONTIN) 100 MG capsule Take 1 capsule (100 mg total) by mouth in the morning AND 3 capsules (300 mg total) at bedtime.   pantoprazole (PROTONIX) 40 MG tablet Take 1 tablet (40 mg total) by mouth daily.     Allergies:   Latex   Social History   Socioeconomic History   Marital status: Married    Spouse name: Not on file   Number of children: 2   Years of education: Not on file   Highest education level: Not on file  Occupational History   Occupation: [employer]    Employer: [employer]  Tobacco Use   Smoking status: Never   Smokeless tobacco: Never  Vaping Use   Vaping Use: Never used  Substance and Sexual Activity   Alcohol use: No  Drug use: No   Sexual activity: Yes    Birth control/protection: Other-see comments    Comment: essure implant   Other Topics Concern   Not on file  Social History Narrative   Lives at home with husband and children   Right handed   Caffeine: 2 cups/day   Social Determinants of Health   Financial Resource Strain: Not on file  Food Insecurity: Not on file  Transportation Needs: Not on file  Physical Activity: Not on file  Stress: Not on file  Social Connections: Not on file     Family History: The patient's family history includes Alcohol abuse in her brother and father; Arthritis in her maternal grandfather and maternal grandmother; Asthma in  her brother and daughter; Breast cancer in her maternal grandmother; Cancer - Other in her maternal grandfather; Diabetes in her maternal grandfather, mother, paternal grandfather, and paternal grandmother; Hearing loss in her maternal grandfather; Heart attack in her maternal grandfather; Heart disease in her maternal grandfather and paternal grandmother; Hyperlipidemia in her maternal grandfather, maternal grandmother, and mother; Hypertension in her maternal grandfather, maternal grandmother, mother, and paternal grandmother; Hypothyroidism in her mother; Intellectual disability in her daughter; Kidney disease in her maternal grandfather and maternal grandmother; Stroke in her father. There is no history of Colon cancer, Esophageal cancer, Rectal cancer, Stomach cancer, Colon polyps, Headache, or Migraines.  ROS:   Please see the history of present illness.    All other systems reviewed and are negative.  EKGs/Labs/Other Studies Reviewed Today:     Wearable monitor reviewed  EKG:  Last EKG results: today. Sinus tachycardia   Recent Labs: 11/14/2021: ALT 19; Magnesium 1.9; TSH 2.070 04/03/2022: BUN 10; Creatinine, Ser 0.66; Hemoglobin 13.5; Platelets 233; Potassium 4.8; Sodium 144     Physical Exam:    VS:  BP 126/70   Pulse (!) 110   Ht '5\' 2"'$  (1.575 m)   Wt 218 lb 6.4 oz (99.1 kg)   SpO2 98%   BMI 39.95 kg/m     Wt Readings from Last 3 Encounters:  05/19/22 218 lb 6.4 oz (99.1 kg)  04/18/22 212 lb (96.2 kg)  04/03/22 216 lb 3.2 oz (98.1 kg)     GEN:  Well nourished, well developed in no acute distress CARDIAC: RRR, no murmurs, rubs, gallops RESPIRATORY:  Normal work of breathing MUSCULOSKELETAL: no edema    ASSESSMENT & PLAN:    SVT: appears to be long R-P. Not reproducible with EP study. I suspect she has an atrial tachycardia. It is well managed with atenolol. She should continue to take atenolol. She may follow-up with her PCP, and I will be happy to see her again  should she have recurrence or new arrhythmia issues arise.   Sinus tachycardia: continue betablocker. No intervention indicated        Medication Adjustments/Labs and Tests Ordered: Current medicines are reviewed at length with the patient today.  Concerns regarding medicines are outlined above.  Orders Placed This Encounter  Procedures   EKG 12-Lead   No orders of the defined types were placed in this encounter.    Signed, Paige Quitter, MD  05/19/2022 2:06 PM    [place]

## 2022-05-19 NOTE — Patient Instructions (Addendum)
Medication Instructions:  Your physician recommends that you continue on your current medications as directed. Please refer to the Current Medication list given to you today.  *If you need a refill on your cardiac medications before your next appointment, please call your pharmacy*  Lab Work: None ordered.  If you have labs (blood work) drawn today and your tests are completely normal, you will receive your results only by: [place] (if you have MyChart) OR A paper copy in the mail If you have any lab test that is abnormal or we need to change your treatment, we will call you to review the results.  Testing/Procedures: None ordered.  Follow-Up: At [facility], you and your health needs are our priority.  As part of our continuing mission to provide you with exceptional heart care, we have created designated Provider Care Teams.  These Care Teams include your primary Cardiologist (physician) and Advanced Practice Providers (APPs -  Physician Assistants and Nurse Practitioners) who all work together to provide you with the care you need, when you need it.  We recommend signing up for the patient portal called "MyChart".  Sign up information is provided on this After Visit Summary.  MyChart is used to connect with patients for Virtual Visits (Telemedicine).  Patients are able to view lab/test results, encounter notes, upcoming appointments, etc.  Non-urgent messages can be sent to your provider as well.   To learn more about what you can do with MyChart, go to [email or web address].    Your next appointment:   AS NEEDED  The format for your next appointment:   In Person  Provider:   Doralee Albino, MD{or one of the following Advanced Practice Providers on your designated Care Team:   Tommye Standard, [place] Legrand Como "[facility]" [place], [place]

## 2022-06-08 ENCOUNTER — Other Ambulatory Visit: Payer: Self-pay | Admitting: Pulmonary Disease

## 2022-06-08 DIAGNOSIS — R0982 Postnasal drip: Secondary | ICD-10-CM

## 2022-06-09 ENCOUNTER — Other Ambulatory Visit: Payer: Self-pay

## 2022-06-09 MED ORDER — FLUTICASONE PROPIONATE 50 MCG/ACT NA SUSP
2.0000 | Freq: Every day | NASAL | 2 refills | Status: AC
  Filled 2022-06-09 – 2023-04-14 (×2): qty 16, 30d supply, fill #0
  Filled 2023-05-13 – 2023-05-25 (×2): qty 16, 30d supply, fill #1

## 2022-06-10 ENCOUNTER — Other Ambulatory Visit: Payer: Self-pay

## 2022-06-27 ENCOUNTER — Other Ambulatory Visit: Payer: Self-pay

## 2022-07-06 ENCOUNTER — Other Ambulatory Visit (HOSPITAL_COMMUNITY): Payer: Self-pay

## 2022-07-07 ENCOUNTER — Other Ambulatory Visit: Payer: Self-pay

## 2022-07-07 ENCOUNTER — Other Ambulatory Visit (HOSPITAL_COMMUNITY): Payer: Self-pay

## 2022-07-08 ENCOUNTER — Other Ambulatory Visit (HOSPITAL_COMMUNITY): Payer: Self-pay

## 2022-09-12 ENCOUNTER — Other Ambulatory Visit: Payer: Self-pay

## 2022-10-16 ENCOUNTER — Other Ambulatory Visit: Payer: Self-pay

## 2023-01-08 DIAGNOSIS — Z6837 Body mass index (BMI) 37.0-37.9, adult: Secondary | ICD-10-CM | POA: Diagnosis not present

## 2023-01-08 DIAGNOSIS — N920 Excessive and frequent menstruation with regular cycle: Secondary | ICD-10-CM | POA: Diagnosis not present

## 2023-01-08 DIAGNOSIS — Z1231 Encounter for screening mammogram for malignant neoplasm of breast: Secondary | ICD-10-CM | POA: Diagnosis not present

## 2023-01-08 DIAGNOSIS — Z01419 Encounter for gynecological examination (general) (routine) without abnormal findings: Secondary | ICD-10-CM | POA: Diagnosis not present

## 2023-01-08 DIAGNOSIS — Z124 Encounter for screening for malignant neoplasm of cervix: Secondary | ICD-10-CM | POA: Diagnosis not present

## 2023-02-19 ENCOUNTER — Other Ambulatory Visit: Payer: Self-pay

## 2023-02-19 ENCOUNTER — Other Ambulatory Visit: Payer: Self-pay | Admitting: Physician Assistant

## 2023-02-19 MED ORDER — ATENOLOL 25 MG PO TABS
25.0000 mg | ORAL_TABLET | Freq: Every day | ORAL | 5 refills | Status: DC
  Filled 2023-02-19: qty 30, 30d supply, fill #0
  Filled 2023-04-14: qty 30, 30d supply, fill #1
  Filled 2023-05-13 – 2023-05-25 (×2): qty 30, 30d supply, fill #2
  Filled 2023-07-06: qty 30, 30d supply, fill #3
  Filled 2023-09-13: qty 30, 30d supply, fill #4
  Filled 2023-11-16: qty 30, 30d supply, fill #5

## 2023-04-01 NOTE — Patient Instructions (Signed)
Below is our plan:  We will continue gabapentin up to 400mg  daily. Try to wean slowly over 2-4 weeks at a time.   Please make sure you are staying well hydrated. I recommend 50-60 ounces daily. Well balanced diet and regular exercise encouraged. Consistent sleep schedule with 6-8 hours recommended.   Please continue follow up with care team as directed.   Follow up with me in 1 year   You may receive a survey regarding today's visit. I encourage you to leave honest feed back as I do use this information to improve patient care. Thank you for seeing me today!   GENERAL HEADACHE INFORMATION:   Natural supplements: Magnesium Oxide or Magnesium Glycinate 500 mg at bed (up to 800 mg daily) Coenzyme Q10 300 mg in AM Vitamin B2- 200 mg twice a day   Add 1 supplement at a time since even natural supplements can have undesirable side effects. You can sometimes buy supplements cheaper (especially Coenzyme Q10) at www.WebmailGuide.co.za or at [facility].  Migraine with aura: There is increased risk for stroke in women with migraine with aura and a contraindication for the combined contraceptive pill for use by women who have migraine with aura. The risk for women with migraine without aura is lower. However other risk factors like smoking are far more likely to increase stroke risk than migraine. There is a recommendation for no smoking and for the use of OCPs without estrogen such as progestogen only pills particularly for women with migraine with aura.Marland Kitchen People who have migraine headaches with auras may be 3 times more likely to have a stroke caused by a blood clot, compared to migraine patients who don't see auras. Women who take hormone-replacement therapy may be 30 percent more likely to suffer a clot-based stroke than women not taking medication containing estrogen. Other risk factors like smoking and high blood pressure may be  much more important.    Vitamins and herbs that show potential:   Magnesium:  Magnesium (250 mg twice a day or 500 mg at bed) has a relaxant effect on smooth muscles such as blood vessels. Individuals suffering from frequent or daily headache usually have low magnesium levels which can be increase with daily supplementation of 400-750 mg. Three trials found 40-90% average headache reduction  when used as a preventative. Magnesium may help with headaches are aura, the best evidence for magnesium is for migraine with aura is its thought to stop the cortical spreading depression we believe is the pathophysiology of migraine aura.Magnesium also demonstrated the benefit in menstrually related migraine.  Magnesium is part of the messenger system in the serotonin cascade and it is a good muscle relaxant.  It is also useful for constipation which can be a side effect of other medications used to treat migraine. Good sources include nuts, whole grains, and tomatoes. Side Effects: loose stool/diarrhea  Riboflavin (vitamin B 2) 200 mg twice a day. This vitamin assists nerve cells in the production of ATP a principal energy storing molecule.  It is necessary for many chemical reactions in the body.  There have been at least 3 clinical trials of riboflavin using 400 mg per day all of which suggested that migraine frequency can be decreased.  All 3 trials showed significant improvement in over half of migraine sufferers.  The supplement is found in bread, cereal, milk, meat, and poultry.  Most Americans get more riboflavin than the recommended daily allowance, however riboflavin deficiency is not necessary for the supplements to help prevent  headache. Side effects: energizing, green urine   Coenzyme Q10: This is present in almost all cells in the body and is critical component for the conversion of energy.  Recent studies have shown that a nutritional supplement of CoQ10 can reduce the frequency of migraine attacks by improving the energy production of cells as with riboflavin.  Doses of 150 mg twice a  day have been shown to be effective.   Melatonin: Increasing evidence shows correlation between melatonin secretion and headache conditions.  Melatonin supplementation has decreased headache intensity and duration.  It is widely used as a sleep aid.  Sleep is natures way of dealing with migraine.  A dose of 3 mg is recommended to start for headaches including cluster headache. Higher doses up to 15 mg has been reviewed for use in Cluster headache and have been used. The rationale behind using melatonin for cluster is that many theories regarding the cause of Cluster headache center around the disruption of the normal circadian rhythm in the brain.  This helps restore the normal circadian rhythm.   HEADACHE DIET: Foods and beverages which may trigger migraine Note that only 20% of headache patients are food sensitive. You will know if you are food sensitive if you get a headache consistently 20 minutes to 2 hours after eating a certain food. Only cut out a food if it causes headaches, otherwise you might remove foods you enjoy! What matters most for diet is to eat a well balanced healthy diet full of vegetables and low fat protein, and to not miss meals.   Chocolate, other sweets ALL cheeses except cottage and cream cheese Dairy products, yogurt, sour cream, ice cream Liver Meat extracts (Bovril, Marmite, meat tenderizers) Meats or fish which have undergone aging, fermenting, pickling or smoking. These include: Hotdogs,salami,Lox,sausage, mortadellas,smoked salmon, pepperoni, Pickled herring Pods of broad bean (English beans, Chinese pea pods, [place] (fava) beans, lima and navy beans Ripe avocado, ripe banana Yeast extracts or active yeast preparations such as Brewer's or Fleishman's (commercial bakes goods are permitted) Tomato based foods, pizza (lasagna, etc.)   MSG (monosodium glutamate) is disguised as many things; look for these common aliases: Monopotassium glutamate Autolysed  yeast Hydrolysed protein Sodium caseinate "flavorings" "all natural preservatives" Nutrasweet   Avoid all other foods that convincingly provoke headaches.   Resources: The Dizzy [place]air Laundry Your Headache Diet, migrainestrong.com  https://zamora-andrews.com/   Caffeine and Migraine For patients that have migraine, caffeine intake more than 3 days per week can lead to dependency and increased migraine frequency. I would recommend cutting back on your caffeine intake as best you can. The recommended amount of caffeine is 200-300 mg daily, although migraine patients may experience dependency at even lower doses. While you may notice an increase in headache temporarily, cutting back will be helpful for headaches in the long run. For more information on caffeine and migraine, visit: https://americanmigrainefoundation.org/resource-library/caffeine-and-migraine/   Headache Prevention Strategies:   1. Maintain a headache diary; learn to identify and avoid triggers.  - This can be a simple note where you log when you had a headache, associated symptoms, and medications used - There are several smartphone apps developed to help track migraines: Migraine Buddy, Migraine Monitor, Curelator N1-Headache App   Common triggers include: Emotional triggers: Emotional/Upset family or friends Emotional/Upset occupation Business reversal/success Anticipation anxiety Crisis-serious Post-crisis periodNew job/position   Physical triggers: Vacation Day Weekend Strenuous Exercise High Altitude Location New Move Menstrual Day Physical Illness Oversleep/Not enough sleep Weather changes Light: Photophobia or light sesnitivity  treatment involves a balance between desensitization and reduction in overly strong input. Use dark polarized glasses outside, but not inside. Avoid bright or fluorescent light, but do not dim environment to the point that going into a  normally lit room hurts. Consider FL-41 tint lenses, which reduce the most irritating wavelengths without blocking too much light.  These can be obtained at axonoptics.com or theraspecs.com Foods: see list above.   2. Limit use of acute treatments (over-the-counter medications, triptans, etc.) to no more than 2 days per week or 10 days per month to prevent medication overuse headache (rebound headache).     3. Follow a regular schedule (including weekends and holidays): Don't skip meals. Eat a balanced diet. 8 hours of sleep nightly. Minimize stress. Exercise 30 minutes per day. Being overweight is associated with a 5 times increased risk of chronic migraine. Keep well hydrated and drink 6-8 glasses of water per day.   4. Initiate non-pharmacologic measures at the earliest onset of your headache. Rest and quiet environment. Relax and reduce stress. Breathe2Relax is a free app that can instruct you on    some simple relaxtion and breathing techniques. Http://Dawnbuse.com is a    free website that provides teaching videos on relaxation.  Also, there are  many apps that   can be downloaded for "mindful" relaxation.  An app called YOGA NIDRA will help walk you through mindfulness. Another app called Calm can be downloaded to give you a structured mindfulness guide with daily reminders and skill development. Headspace for guided meditation Mindfulness Based Stress Reduction Online Course: www.palousemindfulness.com Cold compresses.   5. Don't wait!! Take the maximum allowable dosage of prescribed medication at the first sign of migraine.   6. Compliance:  Take prescribed medication regularly as directed and at the first sign of a migraine.   7. Communicate:  Call your physician when problems arise, especially if your headaches change, increase in frequency/severity, or become associated with neurological symptoms (weakness, numbness, slurred speech, etc.). Proceed to emergency room if you experience  new or worsening symptoms or symptoms do not resolve, if you have new neurologic symptoms or if headache is severe, or for any concerning symptom.   8. Headache/pain management therapies: Consider various complementary methods, including medication, behavioral therapy, psychological counselling, biofeedback, massage therapy, acupuncture, dry needling, and other modalities.  Such measures may reduce the need for medications. Counseling for pain management, where patients learn to function and ignore/minimize their pain, seems to work very well.   9. Recommend changing family's attention and focus away from patient's headaches. Instead, emphasize daily activities. If first question of day is 'How are your headaches/Do you have a headache today?', then patient will constantly think about headaches, thus making them worse. Goal is to re-direct attention away from headaches, toward daily activities and other distractions.   10. Helpful Websites: www.AmericanHeadacheSociety.org PatentHood.ch www.headaches.org TightMarket.nl www.achenet.org

## 2023-04-01 NOTE — Progress Notes (Unsigned)
No chief complaint on file.    HISTORY OF PRESENT ILLNESS:  04/01/23 ALL:  Paige Hansen returns for follow up for occipital neuralgia. She continues gabapentin 100/300.   04/03/2022 ALL:  Paige Hansen returns for follow up for occipital neuralgia. She continues gabapentin 100/200mg . She reports doing really well until about 4-5 weeks ago. She reports dull headaches and occipital pain have worsened over the past couple of weeks. No clear cause. She has been followed for SVT and planning to have an ablation 04/18/2022. She drinks about 3 bottles of water daily.   04/03/2021 ALL: Paige Hansen is a 43 y.o. female here today for follow up for occipital neuralgia. We continued gabapentin 100/200 and added oxcarbazepine 150mg  BID in 11/2020. Since, she has discontinued oxcarb. She did not like side effects. Pain has been well managed on gabapentin alone. Occasionally she will take ibuprofen 800mg . She has rare "zapping" pain.   11/27/20 ALL (Mychart): Paige Hansen is a 43 y.o. female here today for follow up for headaches. She reports that she is having more frequent and intense electrical sensations of the right occipital region. She feels "zapping" occurs about 10 times a day and last for several seconds then resolves. Pain radiates to the top of her head and occasionally to her right eye. She continues gabapentin 100mg  in the mornings and 200mg  at bedtime. She can not tolerate higher doses due to sleepiness. Cyclobenzaprine was not helpful. Sleep study was normal. Last B12 was 268. She continues oral supplements.    05/29/20 ALL:  Paige Hansen is a 43 y.o. female here today for follow up for headaches. She was seen by Dr Lucia Gaskins in 02/2020 for dull headache in occipital region of her head with occasional feelings of being "zapped", mostly on right side. Headaches started following administration of 1st Covid vaccine. She was given occipital nerve block in the office that was helpful.  PT  recommended for dry needling as she carried more stress in her neck and shoulders. She has had one session and feels headache may be a little worse. She feels that the intensity is worse.  Dr Lucia Gaskins also started gabapentin 100mg  up to three times daily. She does not feel that it helps. Flexeril helps more than anything. When she takes it at night, she wakes without a headache. B12 supplements recommended. She has not seen PCP for recheck. PCP moved and she is looking for a new provider. She feels that she is sleeping ok. She usually goes to bed aroun 10p. Wakes around 6a. She does snore. She wakes herself up snoring. She endorses a dry mouth. Most all headaches are present in the morning but unsure if they are present upon waking. She feels that she is tired during the day. She takes a nap every day around 1-3 if she is not working. Mom has sleep apnea but doesn't use CPAP.    REVIEW OF SYSTEMS: Out of a complete 14 system review of symptoms, the patient complains only of the following symptoms, head pain and all other reviewed systems are negative.   ALLERGIES: Allergies  Allergen Reactions   Latex Anaphylaxis    Required epipen for reaction on 03/23/21     HOME MEDICATIONS: Outpatient Medications Prior to Visit  Medication Sig Dispense Refill   atenolol (TENORMIN) 25 MG tablet Take 1 tablet (25 mg total) by mouth daily. 30 tablet 5   cetirizine (ZYRTEC) 10 MG tablet Take 10 mg by mouth daily as needed for allergies.  EPINEPHrine 0.3 mg/0.3 mL IJ SOAJ injection Inject 0.3 mg into the muscle as needed for anaphylaxis. 1 each 0   fluticasone (FLONASE) 50 MCG/ACT nasal spray Place 2 sprays into both nostrils daily. 16 g 2   gabapentin (NEURONTIN) 100 MG capsule Take 1 capsule (100 mg total) by mouth in the morning AND 3 capsules (300 mg total) at bedtime. 360 capsule 3   pantoprazole (PROTONIX) 40 MG tablet Take 1 tablet (40 mg total) by mouth daily. 90 tablet 3   No facility-administered  medications prior to visit.     PAST MEDICAL HISTORY: Past Medical History:  Diagnosis Date   Abdominal pain, epigastric 06/24/2012   Allergy    Anxiety    Colitis    Depression    Post Partum    Diarrhea 06/24/2012   Frequent headaches    GERD (gastroesophageal reflux disease)    Hemorrhoids    Hypertension      PAST SURGICAL HISTORY: Past Surgical History:  Procedure Laterality Date   COLONOSCOPY     ESSURE TUBAL LIGATION  04/2009   SVT ABLATION N/A 04/18/2022   Procedure: SVT ABLATION;  Surgeon: Maurice Small, MD;  Location: [facility];  Service: Cardiovascular;  Laterality: N/A;   WISDOM TOOTH EXTRACTION     1998     FAMILY HISTORY: Family History  Problem Relation Age of Onset   Diabetes Mother    Hyperlipidemia Mother    Hypertension Mother    Hypothyroidism Mother    Alcohol abuse Father    Stroke Father    Alcohol abuse Brother    Asthma Brother    Breast cancer Maternal Grandmother    Arthritis Maternal Grandmother    Hyperlipidemia Maternal Grandmother    Hypertension Maternal Grandmother    Kidney disease Maternal Grandmother    Arthritis Maternal Grandfather    Cancer - Other Maternal Grandfather        appendix   Diabetes Maternal Grandfather    Hearing loss Maternal Grandfather    Heart disease Maternal Grandfather    Hyperlipidemia Maternal Grandfather    Hypertension Maternal Grandfather    Kidney disease Maternal Grandfather    Heart attack Maternal Grandfather    Diabetes Paternal Grandmother    Heart disease Paternal Grandmother    Hypertension Paternal Grandmother    Diabetes Paternal Grandfather    Asthma Daughter    Intellectual disability Daughter    Colon cancer Neg Hx    Esophageal cancer Neg Hx    Rectal cancer Neg Hx    Stomach cancer Neg Hx    Colon polyps Neg Hx    Headache Neg Hx    Migraines Neg Hx      SOCIAL HISTORY: Social History   Socioeconomic History   Marital status: Married    Spouse name:  Not on file   Number of children: 2   Years of education: Not on file   Highest education level: Not on file  Occupational History   Occupation: [employer]    Employer:   Tobacco Use   Smoking status: Never   Smokeless tobacco: Never  Vaping Use   Vaping status: Never Used  Substance and Sexual Activity   Alcohol use: No   Drug use: No   Sexual activity: Yes    Birth control/protection: Other-see comments    Comment: essure implant   Other Topics Concern   Not on file  Social History Narrative   Lives at home with husband and  children   Right handed   Caffeine: 2 cups/day   Social Drivers of Corporate investment banker Strain: Not on file  Food Insecurity: Not on file  Transportation Needs: Not on file  Physical Activity: Not on file  Stress: Not on file  Social Connections: Not on file  Intimate Partner Violence: Not on file     PHYSICAL EXAM  There were no vitals filed for this visit.   There is no height or weight on file to calculate BMI.  Generalized: Well developed, in no acute distress  Cardiology: normal rate and rhythm, no murmur auscultated  Respiratory: clear to auscultation bilaterally    Neurological examination  Mentation: Alert oriented to time, place, history taking. Follows all commands speech and language fluent Cranial nerve II-XII: Pupils were equal round reactive to light. Extraocular movements were full, visual field were full on confrontational test. Facial sensation and strength were normal. Head turning and shoulder shrug  were normal and symmetric. Motor: The motor testing reveals 5 over 5 strength of all 4 extremities. Good symmetric motor tone is noted throughout.  Gait and station: Gait is normal.    DIAGNOSTIC DATA (LABS, IMAGING, TESTING) - I reviewed patient records, labs, notes, testing and imaging myself where available.  Lab Results  Component Value Date   WBC 6.8 04/03/2022   HGB 13.5 04/03/2022   HCT 40.3  04/03/2022   MCV 88 04/03/2022   PLT 233 04/03/2022      Component Value Date/Time   NA 144 04/03/2022 0849   K 4.8 04/03/2022 0849   CL 107 (H) 04/03/2022 0849   CO2 28 04/03/2022 0849   GLUCOSE 126 (H) 04/03/2022 0849   GLUCOSE 93 11/30/2019 1116   BUN 10 04/03/2022 0849   CREATININE 0.66 04/03/2022 0849   CALCIUM 9.2 04/03/2022 0849   PROT 6.7 11/14/2021 1108   ALBUMIN 4.7 11/14/2021 1108   AST 14 11/14/2021 1108   ALT 19 11/14/2021 1108   ALKPHOS 87 11/14/2021 1108   BILITOT 0.4 11/14/2021 1108   GFRNONAA >60 11/10/2010 1930   GFRAA >60 11/10/2010 1930   Lab Results  Component Value Date   CHOL 134 11/30/2019   HDL 39.60 11/30/2019   LDLCALC 77 11/30/2019   TRIG 88.0 11/30/2019   CHOLHDL 3 11/30/2019   Lab Results  Component Value Date   HGBA1C 5.3 11/30/2019   Lab Results  Component Value Date   VITAMINB12 288 03/29/2021   Lab Results  Component Value Date   TSH 2.070 11/14/2021        No data to display               No data to display           ASSESSMENT AND PLAN  43 y.o. year old female  has a past medical history of Abdominal pain, epigastric (06/24/2012), Allergy, Anxiety, Colitis, Depression, Diarrhea (06/24/2012), Frequent headaches, GERD (gastroesophageal reflux disease), Hemorrhoids, and Hypertension. here with    No diagnosis found.  Viva has noticed more dull headaches and occipital pain over the past few weeks. I will have her increase gabapentin to 300mg  at bedtime and continue morning dose of 100mg . If headaches are not improving after ablation, she will call and we can consider restarting oxcarbazepine versus adding another agent. May continue OTC analgesics as needed. Healthy lifestyle habits encouraged. She will follow up in 1 year, sooner if needed.    No orders of the defined types were placed in this  encounter.    No orders of the defined types were placed in this encounter.     Shawnie Dapper, MSN, FNP-C  04/01/2023, 4:07 PM  [facility] [address], [address] [place], [place] [address] [phone]

## 2023-04-02 ENCOUNTER — Ambulatory Visit: Payer: Commercial Managed Care - PPO | Admitting: Family Medicine

## 2023-04-02 ENCOUNTER — Other Ambulatory Visit: Payer: Self-pay

## 2023-04-02 ENCOUNTER — Encounter: Payer: Self-pay | Admitting: Family Medicine

## 2023-04-02 VITALS — BP 116/81 | HR 75 | Ht 62.0 in | Wt 211.0 lb

## 2023-04-02 DIAGNOSIS — G44209 Tension-type headache, unspecified, not intractable: Secondary | ICD-10-CM

## 2023-04-02 DIAGNOSIS — H5203 Hypermetropia, bilateral: Secondary | ICD-10-CM | POA: Diagnosis not present

## 2023-04-02 DIAGNOSIS — M5481 Occipital neuralgia: Secondary | ICD-10-CM

## 2023-04-02 MED ORDER — GABAPENTIN 100 MG PO CAPS
ORAL_CAPSULE | ORAL | 3 refills | Status: AC
  Filled 2023-04-02 – 2023-06-17 (×2): qty 360, 90d supply, fill #0
  Filled 2023-09-13: qty 360, 90d supply, fill #1
  Filled 2023-12-13: qty 360, 90d supply, fill #2

## 2023-04-14 ENCOUNTER — Other Ambulatory Visit: Payer: Self-pay

## 2023-05-08 DIAGNOSIS — H00021 Hordeolum internum right upper eyelid: Secondary | ICD-10-CM | POA: Diagnosis not present

## 2023-05-15 ENCOUNTER — Encounter: Payer: Self-pay | Admitting: Pulmonary Disease

## 2023-05-25 ENCOUNTER — Other Ambulatory Visit: Payer: Self-pay

## 2023-06-17 ENCOUNTER — Other Ambulatory Visit: Payer: Self-pay

## 2023-06-17 ENCOUNTER — Other Ambulatory Visit: Payer: Self-pay | Admitting: Physician Assistant

## 2023-06-18 ENCOUNTER — Other Ambulatory Visit: Payer: Self-pay

## 2023-06-18 ENCOUNTER — Other Ambulatory Visit: Payer: Self-pay | Admitting: Physician Assistant

## 2023-06-18 MED FILL — Pantoprazole Sodium EC Tab 40 MG (Base Equiv): ORAL | 90 days supply | Qty: 90 | Fill #0 | Status: AC

## 2023-08-19 DIAGNOSIS — D224 Melanocytic nevi of scalp and neck: Secondary | ICD-10-CM | POA: Diagnosis not present

## 2023-08-19 DIAGNOSIS — L814 Other melanin hyperpigmentation: Secondary | ICD-10-CM | POA: Diagnosis not present

## 2023-08-19 DIAGNOSIS — D225 Melanocytic nevi of trunk: Secondary | ICD-10-CM | POA: Diagnosis not present

## 2023-08-19 DIAGNOSIS — L578 Other skin changes due to chronic exposure to nonionizing radiation: Secondary | ICD-10-CM | POA: Diagnosis not present

## 2023-08-19 DIAGNOSIS — L570 Actinic keratosis: Secondary | ICD-10-CM | POA: Diagnosis not present

## 2023-08-19 DIAGNOSIS — L821 Other seborrheic keratosis: Secondary | ICD-10-CM | POA: Diagnosis not present

## 2023-08-19 DIAGNOSIS — D2272 Melanocytic nevi of left lower limb, including hip: Secondary | ICD-10-CM | POA: Diagnosis not present

## 2023-08-19 DIAGNOSIS — L813 Cafe au lait spots: Secondary | ICD-10-CM | POA: Diagnosis not present

## 2023-08-19 DIAGNOSIS — D2262 Melanocytic nevi of left upper limb, including shoulder: Secondary | ICD-10-CM | POA: Diagnosis not present

## 2023-09-13 ENCOUNTER — Other Ambulatory Visit: Payer: Self-pay | Admitting: Physician Assistant

## 2023-09-14 ENCOUNTER — Other Ambulatory Visit: Payer: Self-pay

## 2023-09-14 ENCOUNTER — Other Ambulatory Visit: Payer: Self-pay | Admitting: Physician Assistant

## 2023-09-14 MED FILL — Pantoprazole Sodium EC Tab 40 MG (Base Equiv): ORAL | 90 days supply | Qty: 90 | Fill #0 | Status: AC

## 2023-09-21 ENCOUNTER — Other Ambulatory Visit: Payer: Self-pay

## 2023-10-06 ENCOUNTER — Other Ambulatory Visit: Payer: Self-pay | Admitting: Medical Genetics

## 2023-10-14 ENCOUNTER — Other Ambulatory Visit (HOSPITAL_COMMUNITY)

## 2023-11-16 ENCOUNTER — Other Ambulatory Visit: Payer: Self-pay | Admitting: Physician Assistant

## 2023-11-16 ENCOUNTER — Other Ambulatory Visit: Payer: Self-pay

## 2023-12-13 ENCOUNTER — Other Ambulatory Visit: Payer: Self-pay | Admitting: Pulmonary Disease

## 2023-12-13 ENCOUNTER — Other Ambulatory Visit: Payer: Self-pay | Admitting: Physician Assistant

## 2023-12-13 DIAGNOSIS — R0982 Postnasal drip: Secondary | ICD-10-CM

## 2023-12-14 ENCOUNTER — Other Ambulatory Visit: Payer: Self-pay

## 2023-12-14 ENCOUNTER — Other Ambulatory Visit: Payer: Self-pay | Admitting: Physician Assistant

## 2023-12-15 ENCOUNTER — Other Ambulatory Visit: Payer: Self-pay

## 2023-12-15 MED FILL — Atenolol Tab 25 MG: ORAL | 30 days supply | Qty: 30 | Fill #0 | Status: AC

## 2023-12-15 MED FILL — Pantoprazole Sodium EC Tab 40 MG (Base Equiv): ORAL | 90 days supply | Qty: 90 | Fill #0 | Status: AC

## 2023-12-17 ENCOUNTER — Other Ambulatory Visit: Payer: Self-pay

## 2023-12-21 ENCOUNTER — Other Ambulatory Visit: Payer: Self-pay | Admitting: Physician Assistant

## 2023-12-21 ENCOUNTER — Other Ambulatory Visit: Payer: Self-pay

## 2024-01-03 ENCOUNTER — Other Ambulatory Visit: Payer: Self-pay | Admitting: Medical Genetics

## 2024-01-03 DIAGNOSIS — Z006 Encounter for examination for normal comparison and control in clinical research program: Secondary | ICD-10-CM

## 2024-01-14 ENCOUNTER — Other Ambulatory Visit: Payer: Self-pay | Admitting: Physician Assistant

## 2024-01-14 ENCOUNTER — Other Ambulatory Visit: Payer: Self-pay | Admitting: Pulmonary Disease

## 2024-01-14 DIAGNOSIS — R0982 Postnasal drip: Secondary | ICD-10-CM

## 2024-01-15 ENCOUNTER — Other Ambulatory Visit: Payer: Self-pay

## 2024-01-15 ENCOUNTER — Other Ambulatory Visit: Payer: Self-pay | Admitting: Physician Assistant

## 2024-01-15 MED FILL — Atenolol Tab 25 MG: ORAL | 15 days supply | Qty: 15 | Fill #0 | Status: AC

## 2024-01-20 ENCOUNTER — Other Ambulatory Visit: Payer: Self-pay

## 2024-02-06 ENCOUNTER — Encounter: Payer: Self-pay | Admitting: Gastroenterology

## 2024-02-17 ENCOUNTER — Other Ambulatory Visit: Payer: Self-pay | Admitting: Physician Assistant

## 2024-02-17 ENCOUNTER — Other Ambulatory Visit: Payer: Self-pay

## 2024-02-18 ENCOUNTER — Other Ambulatory Visit: Payer: Self-pay

## 2024-03-15 ENCOUNTER — Other Ambulatory Visit: Payer: Self-pay | Admitting: Nurse Practitioner

## 2024-03-15 NOTE — Telephone Encounter (Unsigned)
 Copied from CRM [id number]. Topic: Clinical - Medication Refill >> Mar 15, 2024  2:26 PM Joesph B wrote: Medication: atenolol  (TENORMIN ) 25 MG tablet- patient was told pcp needs to prescribe this   Has the patient contacted their pharmacy? Yes (Agent: If no, request that the patient contact the pharmacy for the refill. If patient does not wish to contact the pharmacy document the reason why and proceed with request.) (Agent: If yes, when and what did the pharmacy advise?)  This is the patient's preferred pharmacy:  [facility] [address] [place] [address] Phone: [phone] Fax: [phone]   Is this the correct pharmacy for this prescription? Yes If no, delete pharmacy and type the correct one.   Has the prescription been filled recently? Yes  Is the patient out of the medication? Yes  Has the patient been seen for an appointment in the last year OR does the patient have an upcoming appointment? Yes  Can we respond through MyChart? Yes  Agent: Please be advised that Rx refills may take up to 3 business days. We ask that you follow-up with your pharmacy.

## 2024-03-16 ENCOUNTER — Other Ambulatory Visit: Payer: Self-pay

## 2024-03-16 MED ORDER — ATENOLOL 25 MG PO TABS
25.0000 mg | ORAL_TABLET | Freq: Every day | ORAL | 1 refills | Status: AC
  Filled 2024-03-16 – 2024-03-17 (×5): qty 90, 90d supply, fill #0

## 2024-03-16 NOTE — Telephone Encounter (Signed)
 Looks like this is usually filled by another provider.

## 2024-03-17 ENCOUNTER — Other Ambulatory Visit: Payer: Self-pay

## 2024-03-17 ENCOUNTER — Other Ambulatory Visit (HOSPITAL_COMMUNITY): Payer: Self-pay

## 2024-04-04 ENCOUNTER — Ambulatory Visit: Payer: Commercial Managed Care - PPO | Admitting: Family Medicine

## 2024-04-13 NOTE — Progress Notes (Unsigned)
 "    04/13/2024 Paige Hansen [id number] 22-Jan-1981  Primary Gastroenterologist: Dr. Leigh    Chief Complaint: Discuss scheduling a colonoscopy   History of Present Illness: Paige Hansen is a 44 year old female with a past medical history of anxiety, depression, SVT, GERD, left sided ulcerative colitis and colon polyps.   Discussed the use of AI scribe software for clinical note transcription with the patient, who gave verbal consent to proceed.  History of Present Illness Paige Hansen is a 44 year old female with colitis and prior colonic polyps who presents for evaluation of intermittent constipation.  Colitis was first diagnosed in 2014. Colonoscopy in October 2022 revealed two polyps, which were removed, and no evidence of active colitis. She is not on medication for colitis and was advised to repeat colonoscopy in three years due to the polyps.  Intermittent constipation is characterized by a sensation of incomplete evacuation and urge to defecate without passage of stool. When bowel movements occur, they are often small in amount and do not provide a sense of complete emptying. Symptoms were most prominent last month but have improved at the time of this visit.  She has tried stool softeners and daily Miralax, sometimes twice daily for about a week, without significant improvement. She has not used fiber supplements, stimulant laxatives, or other over-the-counter constipation medications, and avoids fiber supplements due to prior advice related to colitis. She is not currently using any constipation medications.  She denies blood in the stool, Kedzierski stools, or mucus in the stool. No other gastrointestinal symptoms or changes in health over the past year.  She has a history of SVT which she stated started after she received COVID vaccination and upon questioning, she endorsed having runs of SVT daily sometimes associated with chest pain or dizziness.  She stated the  frequency of runs of SVT have recently increased and can last 30 minutes to several hours.  She previously underwent a EP study 04/18/2022 SVT was not reproducible and she was continued on Atenolol .      Latest Ref Rng & Units 04/03/2022    8:49 AM 11/14/2021   11:08 AM 06/03/2021    2:38 PM  CBC  WBC 3.4 - 10.8 x10E3/uL 6.8  8.6  7.4   Hemoglobin 11.1 - 15.9 g/dL 86.4  85.7  86.3   Hematocrit 34.0 - 46.6 % 40.3  42.3  40.5   Platelets 150 - 450 x10E3/uL 233  262  256.0         Latest Ref Rng & Units 04/03/2022    8:49 AM 11/14/2021   11:08 AM 01/18/2020   10:41 AM  CMP  Glucose 70 - 99 mg/dL 873  886    BUN 6 - 24 mg/dL 10  9    Creatinine 9.42 - 1.00 mg/dL 9.33  9.32  9.39   Sodium 134 - 144 mmol/L 144  139    Potassium 3.5 - 5.2 mmol/L 4.8  4.5    Chloride 96 - 106 mmol/L 107  104    CO2 20 - 29 mmol/L 28  21    Calcium 8.7 - 10.2 mg/dL 9.2  9.4    Total Protein 6.0 - 8.5 g/dL  6.7    Total Bilirubin 0.0 - 1.2 mg/dL  0.4    Alkaline Phos 44 - 121 IU/L  87    AST 0 - 40 IU/L  14    ALT 0 - 32 IU/L  19  GI PROCEDURES:  Colonoscopy 12/17/2020: - The examined portion of the ileum was normal.  - One 6 mm polyp in the transverse colon, removed with a cold snare. Resected and retrieved.  - One 3 mm polyp in the descending colon, removed with a cold snare. Resected and retrieved.  - One diminutive polyp in the sigmoid colon, removed with a cold biopsy forceps.  - 3 year recall colonoscopy   1. Surgical [P], colon, recto-sigmoid and sigmoid, polyp (2) - TUBULAR ADENOMA WITHOUT HIGH-GRADE DYSPLASIA OR MALIGNANCY - OTHER FRAGMENT OF POLYPOID COLONIC MUCOSA WITH NO SPECIFIC HISTOPATHOLOGIC CHANGES 2. Surgical [P], random left colon sites - COLONIC MUCOSA WITH NO SPECIFIC HISTOPATHOLOGIC CHANGES - NEGATIVE FOR ACUTE INFLAMMATION, INCREASED INTRAEPITHELIAL LYMPHOCYTES OR THICKENED SUBEPITHELIAL COLLAGEN TABLE 3. Surgical [P], colon, descending and transverse, polyp (2) - SESSILE SERRATED  POLYP(S) WITHOUT CYTOLOGIC DYSPLASIA  EGD 08/28/2017: - Esophagogastric landmarks identified.  - 2 cm hiatal hernia. - Normal esophagus.  - Normal stomach. Biopsied to rule out H pylori.  - Normal duodenal bulb and second portion of the duodenum. No overt cause for abdominal pain on this exam, will await biopsies.  Colonoscopy 07/15/2012: Abnormal mucosa was found in the rectum and sigmoid colon, multiple random biopsies of the area were performed using cold forceps, consistent with acute colitis from 0 to 15 cm, normal-appearing right and transverse colon  1. Surgical Surgical [P], right colon, random, random, bx - BENIGN COLORECTAL COLORECTAL MUCOSA. MUCOSA. - NO EVIDENCE EVIDENCE OF INCREASED INCREASED INFLAMMATION, INFLAMMATION, DYSPLASIA DYSPLASIA OR MALIGNANCY. MALIGNANCY.  2. Surgical Surgical [P], descending descending colon, 20-50cm 20-50cm random, random, bx - BENIGN COLORECTAL COLORECTAL MUCOSA. MUCOSA. - NO EVIDENCE EVIDENCE OF INCREASED INCREASED INFLAMMATION, INFLAMMATION, DYSPLASIA DYSPLASIA OR MALIGNANCY. MALIGNANCY. 3. Surgical Surgical [P], 0-15cm random, random, bx - MILD ACTIVE CHRONIC CHRONIC COLITIS, COLITIS, SEE COMMENT. COMMENT. - NO DYSPLASIA DYSPLASIA OR MALIGNANCY MALIGNANCY IDENTIFIED. IDENTIFIED. Microscopic   Microscopic Comment Comment 3. The findings findings in the 0-15 cm random biopsy are consistent consistent with inflammatory inflammatory bowel disease disease given a corresponding corresponding endoscopic endoscopic and clinical clinical impression. impression. There is no dysplasia dysplasia or malignancy malignancy identified. identified.   Past Medical History:  Diagnosis Date   Abdominal pain, epigastric 06/24/2012   Allergy    Anxiety    Colitis    Depression    Post Partum    Diarrhea 06/24/2012   Frequent headaches    GERD (gastroesophageal reflux disease)    Hemorrhoids    Hypertension    Past Surgical History:  Procedure Laterality Date    COLONOSCOPY     ESSURE TUBAL LIGATION  04/2009   SVT ABLATION N/A 04/18/2022   Procedure: SVT ABLATION;  Surgeon: Nancey Eulas BRAVO, MD;  Location: [facility];  Service: Cardiovascular;  Laterality: N/A;   WISDOM TOOTH EXTRACTION     1998   Medications Ordered Prior to Encounter[1] Allergies[2]  Current Medications, Allergies, Past Medical History, Past Surgical History, Family History and Social History were reviewed in [employer] record.  Review of Systems:   Constitutional: Negative for fever, sweats, chills or weight loss.  Respiratory: Negative for shortness of breath.   Cardiovascular: Negative for chest pain, palpitations and leg swelling.  Gastrointestinal: See HPI.  Musculoskeletal: Negative for back pain or muscle aches.  Neurological: Negative for dizziness, headaches or paresthesias.   Physical Exam: BP 114/70   Pulse 86   Ht 5' 2 (1.575 m)   Wt 212 lb (96.2 kg)  BMI 38.78 kg/m   General: 44 year old female in no acute distress. Head: Normocephalic and atraumatic. Eyes: No scleral icterus. Conjunctiva pink . Ears: Normal auditory acuity. Mouth: Dentition intact. No ulcers or lesions.  Lungs: Clear throughout to auscultation. Heart: Regular rate and rhythm, no murmur. Abdomen: Soft, nontender and nondistended. No masses or hepatomegaly. Normal bowel sounds x 4 quadrants.  Rectal: Deferred. Musculoskeletal: Symmetrical with no gross deformities. Extremities: No edema. Neurological: Alert oriented x 4. No focal deficits.  Psychological: Alert and cooperative. Normal mood and affect  Assessment and Recommendations:  44 year old female with a remote history of left-sided ulcerative colitis (not on treatment) and colon polyps, due for surveillance colonoscopy. Colonoscopy 12/2020 identified 3 polyps removed from the colon, path report consistent with tubular adenomatous and sessile serrated polyps without evidence of colitis. -  Schedule a colonoscopy after cardiac clearance received - Colonoscopy benefits and risks discussed including risk with sedation, risk of bleeding, perforation and infection   SVT, increasing frequency and intermittently associated with dizziness and chest pain.  On Atenolol  25 mg daily. - Cardiology consult to further evaluate SVT to include cardiac clearance prior to patient scheduling a colonoscopy.  Patient's prior primary cardiologist was Dr. Redell Cave in [place], patient requests a new cardiologist in [place]. Patient stated she is no longer followed by her physiologist Dr. Nancey. - CBC, CMP, TSH and magnesium level  Constipation - Colace stool softener 1 capsule p.o. twice daily - MiraLAX nightly as needed     [1]  Current Outpatient Medications on File Prior to Visit  Medication Sig Dispense Refill   atenolol  (TENORMIN ) 25 MG tablet Take 1 tablet (25 mg total) by mouth daily. 90 tablet 1   EPINEPHrine  0.3 mg/0.3 mL IJ SOAJ injection Inject 0.3 mg into the muscle as needed for anaphylaxis. 1 each 0   fluticasone  (FLONASE ) 50 MCG/ACT nasal spray Place 2 sprays into both nostrils daily. 16 g 2   gabapentin  (NEURONTIN ) 100 MG capsule Take 1 capsule (100 mg total) by mouth in the morning AND 3 capsules (300 mg total) at bedtime. 360 capsule 3   pantoprazole  (PROTONIX ) 40 MG tablet Take 1 tablet (40 mg total) by mouth daily. 90 tablet 0   [DISCONTINUED] metoprolol  tartrate (LOPRESSOR ) 100 MG tablet Take 1 tablet (100 mg) by mouth 2 hours prior to your Cardiac CTA (Patient not taking: Reported on 04/02/2023) 1 tablet 0   No current facility-administered medications on file prior to visit.  [2]  Allergies Allergen Reactions   Latex Anaphylaxis    Required epipen  for reaction on 03/23/21   "

## 2024-04-14 ENCOUNTER — Other Ambulatory Visit

## 2024-04-14 ENCOUNTER — Encounter: Payer: Self-pay | Admitting: Nurse Practitioner

## 2024-04-14 ENCOUNTER — Ambulatory Visit: Admitting: Nurse Practitioner

## 2024-04-14 VITALS — BP 114/70 | HR 86 | Ht 62.0 in | Wt 212.0 lb

## 2024-04-14 DIAGNOSIS — I471 Supraventricular tachycardia, unspecified: Secondary | ICD-10-CM

## 2024-04-14 DIAGNOSIS — K5900 Constipation, unspecified: Secondary | ICD-10-CM

## 2024-04-14 DIAGNOSIS — Z860100 Personal history of colon polyps, unspecified: Secondary | ICD-10-CM

## 2024-04-14 LAB — COMPREHENSIVE METABOLIC PANEL WITH GFR
ALT: 14 U/L (ref 3–35)
AST: 13 U/L (ref 5–37)
Albumin: 4.1 g/dL (ref 3.5–5.2)
Alkaline Phosphatase: 70 U/L (ref 39–117)
BUN: 9 mg/dL (ref 6–23)
CO2: 30 meq/L (ref 19–32)
Calcium: 8.4 mg/dL (ref 8.4–10.5)
Chloride: 108 meq/L (ref 96–112)
Creatinine, Ser: 0.64 mg/dL (ref 0.40–1.20)
GFR: 106.31 mL/min
Glucose, Bld: 110 mg/dL — ABNORMAL HIGH (ref 70–99)
Potassium: 4.3 meq/L (ref 3.5–5.1)
Sodium: 142 meq/L (ref 135–145)
Total Bilirubin: 0.5 mg/dL (ref 0.2–1.2)
Total Protein: 6.8 g/dL (ref 6.0–8.3)

## 2024-04-14 LAB — MAGNESIUM: Magnesium: 1.9 mg/dL (ref 1.5–2.5)

## 2024-04-14 LAB — CBC
HCT: 36.5 % (ref 36.0–46.0)
Hemoglobin: 12.1 g/dL (ref 12.0–15.0)
MCHC: 33 g/dL (ref 30.0–36.0)
MCV: 81.1 fl (ref 78.0–100.0)
Platelets: 211 10*3/uL (ref 150.0–400.0)
RBC: 4.51 Mil/uL (ref 3.87–5.11)
RDW: 14.5 % (ref 11.5–15.5)
WBC: 5.2 10*3/uL (ref 4.0–10.5)

## 2024-04-14 LAB — TSH: TSH: 1.5 u[IU]/mL (ref 0.35–5.50)

## 2024-04-14 NOTE — Patient Instructions (Addendum)
 _______________________________________________________  If your blood pressure at your visit was 140/90 or greater, please contact your primary care physician to follow up on this.  _______________________________________________________  If you are age 46 or older, your body mass index should be between 23-30. Your Body mass index is 38.78 kg/m. If this is out of the aforementioned range listed, please consider follow up with your Primary Care Provider.  If you are age 20 or younger, your body mass index should be between 19-25. Your Body mass index is 38.78 kg/m. If this is out of the aformentioned range listed, please consider follow up with your Primary Care Provider.   ________________________________________________________  The [facility] providers would like to encourage you to use MYCHART to communicate with providers for non-urgent requests or questions.  Due to long hold times on the telephone, sending your provider a message by [facility] may be a faster and more efficient way to get a response.  Please allow 48 business hours for a response.  Please remember that this is for non-urgent requests.  _______________________________________________________  [facility] is using a team-based approach to care.  Your team is made up of your doctor and two to three APPS. Our APPS (Nurse Practitioners and Physician Assistants) work with your physician to ensure care continuity for you. They are fully qualified to address your health concerns and develop a treatment plan. They communicate directly with your gastroenterologist to care for you. Seeing the Advanced Practice Practitioners on your physician's team can help you by facilitating care more promptly, often allowing for earlier appointments, access to diagnostic testing, procedures, and other specialty referrals.   Your provider has requested that you go to the basement level for lab work before leaving today. Press B on the  elevator. The lab is located at the first door on the left as you exit the elevator.  A referral has been sent to cardiology. Please call them in 1 week if you haven't heard from them  Please call us  once you have completed your cardiology workup to see about having a colonoscopy  Please purchase the following medications over the counter and take as directed: Colace 2 times a day as needed Miralax daily at night as needed  Thank you for trusting me with your gastrointestinal care!   Elida Shawl, CRNP

## 2024-04-15 ENCOUNTER — Ambulatory Visit: Payer: Self-pay | Admitting: Nurse Practitioner

## 2024-08-22 ENCOUNTER — Encounter: Admitting: Nurse Practitioner
